# Patient Record
Sex: Male | Born: 1940 | Race: Black or African American | Hispanic: No | Marital: Married | Smoking: Former smoker
Health system: Southern US, Community
[De-identification: ages and names within clinical notes are randomized; demographics above are authoritative.]

## PROBLEM LIST (undated history)

## (undated) DIAGNOSIS — E785 Hyperlipidemia, unspecified: Secondary | ICD-10-CM

## (undated) DIAGNOSIS — J349 Unspecified disorder of nose and nasal sinuses: Secondary | ICD-10-CM

## (undated) DIAGNOSIS — N400 Enlarged prostate without lower urinary tract symptoms: Secondary | ICD-10-CM

## (undated) DIAGNOSIS — C9000 Multiple myeloma not having achieved remission: Secondary | ICD-10-CM

## (undated) DIAGNOSIS — I10 Essential (primary) hypertension: Secondary | ICD-10-CM

## (undated) DIAGNOSIS — M1990 Unspecified osteoarthritis, unspecified site: Secondary | ICD-10-CM

## (undated) DIAGNOSIS — I4891 Unspecified atrial fibrillation: Secondary | ICD-10-CM

## (undated) DIAGNOSIS — Z972 Presence of dental prosthetic device (complete) (partial): Secondary | ICD-10-CM

## (undated) DIAGNOSIS — G4730 Sleep apnea, unspecified: Secondary | ICD-10-CM

## (undated) DIAGNOSIS — T7840XA Allergy, unspecified, initial encounter: Secondary | ICD-10-CM

## (undated) HISTORY — DX: Allergy, unspecified, initial encounter: T78.40XA

## (undated) HISTORY — DX: Hyperlipidemia, unspecified: E78.5

## (undated) HISTORY — DX: Unspecified disorder of nose and nasal sinuses: J34.9

## (undated) HISTORY — DX: Sleep apnea, unspecified: G47.30

## (undated) HISTORY — PX: VASECTOMY: SHX75

## (undated) HISTORY — PX: HERNIA REPAIR: SHX51

---

## 2008-12-17 ENCOUNTER — Encounter: Admission: RE | Admit: 2008-12-17 | Discharge: 2008-12-17 | Payer: Self-pay | Admitting: Geriatric Medicine

## 2008-12-25 ENCOUNTER — Encounter: Admission: RE | Admit: 2008-12-25 | Discharge: 2008-12-25 | Payer: Self-pay | Admitting: Geriatric Medicine

## 2010-03-16 ENCOUNTER — Encounter: Admission: RE | Admit: 2010-03-16 | Discharge: 2010-03-16 | Payer: Self-pay | Admitting: Geriatric Medicine

## 2010-05-28 ENCOUNTER — Encounter: Admission: RE | Admit: 2010-05-28 | Discharge: 2010-05-28 | Payer: Self-pay | Admitting: Geriatric Medicine

## 2012-02-18 ENCOUNTER — Emergency Department (HOSPITAL_COMMUNITY)
Admission: EM | Admit: 2012-02-18 | Discharge: 2012-02-18 | Disposition: A | Discharge status: Home / Self Care | Payer: Medicare Other | Attending: Emergency Medicine | Admitting: Emergency Medicine

## 2012-02-18 ENCOUNTER — Encounter (HOSPITAL_COMMUNITY): Payer: Self-pay | Admitting: Emergency Medicine

## 2012-02-18 ENCOUNTER — Emergency Department (HOSPITAL_COMMUNITY): Payer: Medicare Other

## 2012-02-18 DIAGNOSIS — R509 Fever, unspecified: Secondary | ICD-10-CM | POA: Insufficient documentation

## 2012-02-18 DIAGNOSIS — I10 Essential (primary) hypertension: Secondary | ICD-10-CM | POA: Insufficient documentation

## 2012-02-18 DIAGNOSIS — N400 Enlarged prostate without lower urinary tract symptoms: Secondary | ICD-10-CM | POA: Insufficient documentation

## 2012-02-18 DIAGNOSIS — N390 Urinary tract infection, site not specified: Secondary | ICD-10-CM | POA: Insufficient documentation

## 2012-02-18 HISTORY — DX: Essential (primary) hypertension: I10

## 2012-02-18 HISTORY — DX: Benign prostatic hyperplasia without lower urinary tract symptoms: N40.0

## 2012-02-18 LAB — DIFFERENTIAL
Basophils Absolute: 0 10*3/uL (ref 0.0–0.1)
Basophils Relative: 0 % (ref 0–1)
Eosinophils Absolute: 0 10*3/uL (ref 0.0–0.7)
Eosinophils Relative: 0 % (ref 0–5)
Lymphocytes Relative: 7 % — ABNORMAL LOW (ref 12–46)
Lymphs Abs: 0.9 10*3/uL (ref 0.7–4.0)
Monocytes Absolute: 1.1 10*3/uL — ABNORMAL HIGH (ref 0.1–1.0)
Monocytes Relative: 9 % (ref 3–12)
Neutro Abs: 10.5 10*3/uL — ABNORMAL HIGH (ref 1.7–7.7)
Neutrophils Relative %: 84 % — ABNORMAL HIGH (ref 43–77)

## 2012-02-18 LAB — URINALYSIS, ROUTINE W REFLEX MICROSCOPIC
Bilirubin Urine: NEGATIVE
Glucose, UA: NEGATIVE mg/dL
Hgb urine dipstick: NEGATIVE
Ketones, ur: NEGATIVE mg/dL
Nitrite: POSITIVE — AB
Protein, ur: NEGATIVE mg/dL
Specific Gravity, Urine: 1.017 (ref 1.005–1.030)
Urobilinogen, UA: 1 mg/dL (ref 0.0–1.0)
pH: 7.5 (ref 5.0–8.0)

## 2012-02-18 LAB — BASIC METABOLIC PANEL
BUN: 20 mg/dL (ref 6–23)
CO2: 30 mEq/L (ref 19–32)
Calcium: 9.5 mg/dL (ref 8.4–10.5)
Chloride: 102 mEq/L (ref 96–112)
Creatinine, Ser: 1.46 mg/dL — ABNORMAL HIGH (ref 0.50–1.35)
GFR calc Af Amer: 54 mL/min — ABNORMAL LOW (ref >90)
GFR calc non Af Amer: 47 mL/min — ABNORMAL LOW (ref >90)
Glucose, Bld: 118 mg/dL — ABNORMAL HIGH (ref 70–99)
Potassium: 4.4 mEq/L (ref 3.5–5.1)
Sodium: 140 mEq/L (ref 135–145)

## 2012-02-18 LAB — CBC
HCT: 41.1 % (ref 39.0–52.0)
Hemoglobin: 13.7 g/dL (ref 13.0–17.0)
MCH: 28.8 pg (ref 26.0–34.0)
MCHC: 33.3 g/dL (ref 30.0–36.0)
MCV: 86.3 fL (ref 78.0–100.0)
Platelets: 215 10*3/uL (ref 150–400)
RBC: 4.76 MIL/uL (ref 4.22–5.81)
RDW: 13.6 % (ref 11.5–15.5)
WBC: 12.6 10*3/uL — ABNORMAL HIGH (ref 4.0–10.5)

## 2012-02-18 LAB — URINE MICROSCOPIC-ADD ON

## 2012-02-18 MED ORDER — CIPROFLOXACIN HCL 250 MG PO TABS: 250.0000 mg | ORAL_TABLET | Freq: Two times a day (BID) | ORAL | Status: AC

## 2012-02-18 MED ORDER — ACETAMINOPHEN 325 MG PO TABS
650.0000 mg | ORAL_TABLET | Freq: Once | ORAL | Status: AC
  Administered 2012-02-18: 650 mg via ORAL
  Filled 2012-02-18: qty 2

## 2012-02-18 MED ORDER — CIPROFLOXACIN HCL 500 MG PO TABS
500.0000 mg | ORAL_TABLET | Freq: Once | ORAL | Status: AC
  Administered 2012-02-18: 500 mg via ORAL
  Filled 2012-02-18: qty 1

## 2012-02-18 NOTE — ED Notes (Signed)
Pt states that when he went to bed around 2300, he felt "like something was coming over him".  He got up and took his temperature and states that it said 104.  C/o chills.  Denies any pain.  NAD.

## 2012-02-18 NOTE — Discharge Instructions (Signed)

## 2012-02-18 NOTE — ED Notes (Signed)
Pt presented to the Er with c/o fever and chills that started earlier today, pt states he did not take nothing at home for the fever and that when measure fever was 104.0 (oral). Denies any Shob, denies generalized weakness or aches.

## 2012-02-18 NOTE — ED Provider Notes (Addendum)
History     CSN: [id number]  Arrival date & time 02/18/12  0207   First MD Initiated Contact with Patient 02/18/12 0421      Chief Complaint  Patient presents with  . Fever  . Chills    (Consider location/radiation/quality/duration/timing/severity/associated sxs/prior treatment) HPI This is a 71-year-old black male who developed fever to 104 and chills about midnight this morning. He has not taken anything to treat his fever or chills. His only other symptom is urinary frequency. He denies pain with urination, cough, dyspnea, sore throat, nasal congestion, body aches, nausea, vomiting, diarrhea or abdominal pain.  Past Medical History  Diagnosis Date  . Hypertension   . Prostatic hypertrophy     Past Surgical History  Procedure Date  . Hernia repair   . Vasectomy     Family History  Problem Relation Age of Onset  . Hypertension Other   . Cancer Other     History  Substance Use Topics  . Smoking status: Former Games developer  . Smokeless tobacco: Not on file  . Alcohol Use: No      Review of Systems  All other systems reviewed and are negative.    Allergies  Review of patient's allergies indicates no known allergies.  Home Medications   Current Outpatient Rx  Name Route Sig Dispense Refill  . ASPIRIN 81 MG PO TABS Oral Take 81 mg by mouth daily.    . ATORVASTATIN CALCIUM 20 MG PO TABS Oral Take 20 mg by mouth daily.    Marland Kitchen FINASTERIDE 5 MG PO TABS Oral Take 5 mg by mouth daily.    . OMEGA-3 FATTY ACIDS 1000 MG PO CAPS Oral Take 2 g by mouth daily.    Marland Kitchen ONE-DAILY MULTI VITAMINS PO TABS Oral Take 1 tablet by mouth daily.    Marland Kitchen TERAZOSIN HCL 5 MG PO CAPS Oral Take 5 mg by mouth at bedtime.    Marland Kitchen VITAMIN E 400 UNITS PO CAPS Oral Take 1,200 Units by mouth daily.      BP 128/66  Pulse 101  Temp(Src) 102.9 F (39.4 C) (Oral)  Resp 18  SpO2 95%  Physical Exam General: Well-developed, well-nourished male in no acute distress; appearance consistent with age of  record HENT: normocephalic, atraumatic; no pharyngeal erythema or exudate Eyes: pupils equal round and reactive to light; extraocular muscles intact Neck: supple Heart: regular rate and rhythm Lungs: clear to auscultation bilaterally Abdomen: soft; nondistended; nontender; no masses or hepatosplenomegaly; bowel sounds present GU: Urine cloudy Extremities: No deformity; full range of motion; pulses normal; trace edema of lower leg Neurologic: Awake, alert and oriented; motor function intact in all extremities and symmetric; no facial droop Skin: Warm and dry Psychiatric: Normal mood and affect    ED Course  Procedures (including critical care time)    MDM   Nursing notes and vitals signs, including pulse oximetry, reviewed.  Summary of this visit's results, reviewed by myself:  Labs:  Results for orders placed during the hospital encounter of 02/18/12  URINALYSIS, ROUTINE W REFLEX MICROSCOPIC      Component Value Range   Color, Urine YELLOW  YELLOW    APPearance CLOUDY (*) CLEAR    Specific Gravity, Urine 1.017  1.005 - 1.030    pH 7.5  5.0 - 8.0    Glucose, UA NEGATIVE  NEGATIVE (mg/dL)   Hgb urine dipstick NEGATIVE  NEGATIVE    Bilirubin Urine NEGATIVE  NEGATIVE    Ketones, ur NEGATIVE  NEGATIVE (mg/dL)  Protein, ur NEGATIVE  NEGATIVE (mg/dL)   Urobilinogen, UA 1.0  0.0 - 1.0 (mg/dL)   Nitrite POSITIVE (*) NEGATIVE    Leukocytes, UA MODERATE (*) NEGATIVE   CBC      Component Value Range   WBC 12.6 (*) 4.0 - 10.5 (K/uL)   RBC 4.76  4.22 - 5.81 (MIL/uL)   Hemoglobin 13.7  13.0 - 17.0 (g/dL)   HCT 82.9  56.2 - 13.0 (%)   MCV 86.3  78.0 - 100.0 (fL)   MCH 28.8  26.0 - 34.0 (pg)   MCHC 33.3  30.0 - 36.0 (g/dL)   RDW 86.5  78.4 - 69.6 (%)   Platelets 215  150 - 400 (K/uL)  DIFFERENTIAL      Component Value Range   Neutrophils Relative 84 (*) 43 - 77 (%)   Neutro Abs 10.5 (*) 1.7 - 7.7 (K/uL)   Lymphocytes Relative 7 (*) 12 - 46 (%)   Lymphs Abs 0.9  0.7 - 4.0  (K/uL)   Monocytes Relative 9  3 - 12 (%)   Monocytes Absolute 1.1 (*) 0.1 - 1.0 (K/uL)   Eosinophils Relative 0  0 - 5 (%)   Eosinophils Absolute 0.0  0.0 - 0.7 (K/uL)   Basophils Relative 0  0 - 1 (%)   Basophils Absolute 0.0  0.0 - 0.1 (K/uL)  BASIC METABOLIC PANEL      Component Value Range   Sodium 140  135 - 145 (mEq/L)   Potassium 4.4  3.5 - 5.1 (mEq/L)   Chloride 102  96 - 112 (mEq/L)   CO2 30  19 - 32 (mEq/L)   Glucose, Bld 118 (*) 70 - 99 (mg/dL)   BUN 20  6 - 23 (mg/dL)   Creatinine, Ser 2.95 (*) 0.50 - 1.35 (mg/dL)   Calcium 9.5  8.4 - 28.4 (mg/dL)   GFR calc non Af Amer 47 (*) >90 (mL/min)   GFR calc Af Amer 54 (*) >90 (mL/min)  URINE MICROSCOPIC-ADD ON      Component Value Range   Squamous Epithelial / LPF RARE  RARE    WBC, UA TOO NUMEROUS TO COUNT  <3 (WBC/hpf)   Bacteria, UA MANY (*) RARE     Imaging Studies: Dg Chest 2 View  02/18/2012  *RADIOLOGY REPORT*  Clinical Data: Chills, low grade fever.  CHEST - 2 VIEW  Comparison: 05/28/2010 CT  Findings: Linear opacity at the lung bases, likely scarring or atelectasis.  Otherwise, no focal areas of consolidation.  No pleural effusion or pneumothorax.  The cardiomediastinal contours are within normal limits.  Mild multilevel degenerative changes. There may be mild smooth periosteal thickening along the right humeral shaft.  IMPRESSION: Mild lung base linear opacities, likely scarring or atelectasis. Otherwise, no focal areas of consolidation.  Original Report Authenticated By: Waneta Martins, M.D.            Hanley Seamen, MD 02/18/12 0534  Hanley Seamen, MD 02/18/12 1324

## 2012-02-18 NOTE — ED Notes (Signed)
Pt states he got home about 1030 pm and went to bed at 2300  Pt states about midnight he began to shiver took his temp and it was 104

## 2012-02-18 NOTE — ED Notes (Signed)
[id number]<AV> Expected date:<BR> Expected time:<BR> Means of arrival:<BR> Comments:<BR> Triage 2

## 2012-02-20 LAB — URINE CULTURE
Colony Count: >=100000
Culture  Setup Time: 201303091151

## 2012-02-21 NOTE — ED Notes (Signed)
+   urine Patient treated with cipro-sensitive to same-chart appended per protocol MD. 

## 2012-12-25 ENCOUNTER — Encounter: Payer: Self-pay | Admitting: Pulmonary Disease

## 2012-12-25 ENCOUNTER — Ambulatory Visit (INDEPENDENT_AMBULATORY_CARE_PROVIDER_SITE_OTHER): Payer: Medicare Other | Admitting: Pulmonary Disease

## 2012-12-25 VITALS — BP 140/78 | HR 69 | Temp 97.9°F | Ht 75.0 in | Wt 251.6 lb

## 2012-12-25 DIAGNOSIS — G4733 Obstructive sleep apnea (adult) (pediatric): Secondary | ICD-10-CM

## 2012-12-25 NOTE — Assessment & Plan Note (Signed)
Patient has a history of obstructive sleep apnea diagnosed in 2000, and has done very well with CPAP.  He is well overdue for a new machine, and I would like to take this opportunity to re- optimize his pressure.  I have encouraged him to keep up with his mask changes and supplies, and also asked him to work aggressively on weight loss.  If he is doing well, I will see him back in one year.

## 2012-12-25 NOTE — Progress Notes (Signed)
  Subjective:    Patient ID: Edward Gallagher, male    DOB: [birth year], 72 y.o.   MRN: [id number]  HPI The patient is a 72 year old male who comes in today as a self-referral to establish with a sleep physician.  He was diagnosed with obstructive sleep apnea in 2000, and started on CPAP while living in [place].  He has done very well with the device, and is currently using his initial CPAP device.  He also uses nasal pillows, and has kept up with mask changes and supplies.  The patient feels that he sleeps well when he wears CPAP, and feels rested in the mornings upon arising.  He does not have frequent awakenings at night.  He feels that he has excellent daytime alertness, even with quiet times.  He is very satisfied with his treatment outcome.  The patient states that his weight is up 20 pounds since his initial sleep study, and his epworth score is only one.   Sleep Questionnaire: What time do you typically go to bed?( Between what hours) 10:00p How long does it take you to fall asleep? within minutes How many times during the night do you wake up? 1 What time do you get out of bed to start your day? 0630 Do you drive or operate heavy machinery in your occupation? No How much has your weight changed (up or down) over the past two years? (In pounds) 5 lb (2.268 kg) Have you ever had a sleep study before? Yes If yes, location of study? Dr Das [place] If yes, date of study? 2000-2001 Do you currently use CPAP? Yes If so, what pressure? unsure of pressure---has notbeen adjusted since 2000-2001 Do you wear oxygen at any time? No    Review of Systems  Constitutional: Negative for fever and unexpected weight change.  HENT: Positive for congestion, rhinorrhea and postnasal drip. Negative for ear pain, nosebleeds, sore throat, sneezing, trouble swallowing, dental problem and sinus pressure.        Patient has allergies--takes allergy vaccines  Eyes: Negative for redness and itching.  Respiratory:  Negative for cough, chest tightness, shortness of breath and wheezing.   Cardiovascular: Negative for palpitations and leg swelling.  Gastrointestinal: Negative for nausea and vomiting.  Genitourinary: Negative for dysuria.       Sees urologist  Musculoskeletal: Negative for joint swelling.  Skin: Negative for rash.  Neurological: Negative for headaches.  Hematological: Does not bruise/bleed easily.  Psychiatric/Behavioral: Negative for dysphoric mood. The patient is not nervous/anxious.        Objective:   Physical Exam Constitutional:  Well developed, no acute distress  HENT:  Nares patent without discharge  Oropharynx without exudate, palate and uvula are elongated.   Eyes:  Perrla, eomi, no scleral icterus  Neck:  No JVD, no TMG  Cardiovascular:  Normal rate, regular rhythm, no rubs or gallops.  No murmurs        Intact distal pulses  Pulmonary :  Normal breath sounds, no stridor or respiratory distress   No rales, rhonchi, or wheezing  Abdominal:  Soft, nondistended, bowel sounds present.  No tenderness noted.   Musculoskeletal:  No lower extremity edema noted.  Lymph Nodes:  No cervical lymphadenopathy noted  Skin:  No cyanosis noted  Neurologic:  Alert, appropriate, moves all 4 extremities without obvious deficit.         Assessment & Plan:

## 2012-12-25 NOTE — Patient Instructions (Addendum)
Will send an order to [facility] to get you a new machine.  Will take this opportunity to re-optimize your pressure.  Will call you with the results once I receive your download.  Work on weight reduction Keep up with mask changes and supplies. followup with me in one year if doing well.

## 2013-02-23 ENCOUNTER — Other Ambulatory Visit: Payer: Self-pay | Admitting: Pulmonary Disease

## 2013-02-23 DIAGNOSIS — G4733 Obstructive sleep apnea (adult) (pediatric): Secondary | ICD-10-CM

## 2013-06-19 ENCOUNTER — Telehealth: Payer: Self-pay | Admitting: Pulmonary Disease

## 2013-06-19 NOTE — Telephone Encounter (Signed)
Spoke with pt and he states that his wife reports that he is still snoring while wearing cpap.  I asked the pt if he notices any leaks in his mask.  He states that he is going to check on this tonight and get back with [place] and let us know. - Will await call

## 2013-06-20 NOTE — Telephone Encounter (Signed)
Spoke with patient, patient states he did not notice any leaking over the night States changing the nasal pillows helped and nothing further is needed at this time

## 2013-09-16 ENCOUNTER — Telehealth: Payer: Self-pay | Admitting: Pulmonary Disease

## 2013-09-16 DIAGNOSIS — G4733 Obstructive sleep apnea (adult) (pediatric): Secondary | ICD-10-CM

## 2013-09-16 NOTE — Telephone Encounter (Signed)
Ok to decrease to 14, but make pt aware will need to watch and see how he feels on a subtherapeutic pressure.

## 2013-09-16 NOTE — Telephone Encounter (Signed)
I spoke with pt. He stated his CPAP is set at 16 CM. He believes it is set to high. The air blows the mask off his nose and breaks the seal then he will start snoring. He reports this has been going on x 1 month now. He wants the pressure decreased. Please advise KC thanks

## 2013-09-16 NOTE — Telephone Encounter (Signed)
Pt aware of recs and referral sent.

## 2013-11-24 LAB — BASIC METABOLIC PANEL
Anion Gap: 1 — ABNORMAL LOW (ref 7–16)
BUN: 20 mg/dL — ABNORMAL HIGH (ref 7–18)
Calcium, Total: 9.3 mg/dL (ref 8.5–10.1)
Chloride: 103 mmol/L (ref 98–107)
Co2: 34 mmol/L — ABNORMAL HIGH (ref 21–32)
Creatinine: 1.35 mg/dL — ABNORMAL HIGH (ref 0.60–1.30)
EGFR (African American): >60
EGFR (Non-African Amer.): 52 — ABNORMAL LOW
Glucose: 134 mg/dL — ABNORMAL HIGH (ref 65–99)
Osmolality: 280 (ref 275–301)
Potassium: 4.1 mmol/L (ref 3.5–5.1)
Sodium: 138 mmol/L (ref 136–145)

## 2013-11-24 LAB — CBC
HCT: 40.4 % (ref 40.0–52.0)
HGB: 12.5 g/dL — ABNORMAL LOW (ref 13.0–18.0)
MCH: 26.3 pg (ref 26.0–34.0)
MCHC: 31 g/dL — ABNORMAL LOW (ref 32.0–36.0)
MCV: 85 fL (ref 80–100)
Platelet: 211 10*3/uL (ref 150–440)
RBC: 4.76 10*6/uL (ref 4.40–5.90)
RDW: 14.2 % (ref 11.5–14.5)
WBC: 8.6 10*3/uL (ref 3.8–10.6)

## 2013-11-24 LAB — TROPONIN I: Troponin-I: <0.02 ng/mL

## 2013-11-25 ENCOUNTER — Inpatient Hospital Stay: Payer: Self-pay | Admitting: Internal Medicine

## 2013-11-25 LAB — URINALYSIS, COMPLETE
Bacteria: NONE SEEN
Bilirubin,UR: NEGATIVE
Glucose,UR: NEGATIVE mg/dL (ref 0–75)
Ketone: NEGATIVE
Leukocyte Esterase: NEGATIVE
Nitrite: NEGATIVE
Ph: 5 (ref 4.5–8.0)
Protein: 30
RBC,UR: 2117 /HPF (ref 0–5)
Specific Gravity: 1.054 (ref 1.003–1.030)
Squamous Epithelial: <1
WBC UR: 10 /HPF (ref 0–5)

## 2013-11-25 LAB — TROPONIN I: Troponin-I: <0.02 ng/mL

## 2013-11-26 LAB — BASIC METABOLIC PANEL
Anion Gap: 3 — ABNORMAL LOW (ref 7–16)
BUN: 20 mg/dL — ABNORMAL HIGH (ref 7–18)
Calcium, Total: 8.9 mg/dL (ref 8.5–10.1)
Chloride: 106 mmol/L (ref 98–107)
Co2: 31 mmol/L (ref 21–32)
Creatinine: 1.3 mg/dL (ref 0.60–1.30)
EGFR (African American): >60
EGFR (Non-African Amer.): 55 — ABNORMAL LOW
Glucose: 92 mg/dL (ref 65–99)
Osmolality: 282 (ref 275–301)
Potassium: 4.2 mmol/L (ref 3.5–5.1)
Sodium: 140 mmol/L (ref 136–145)

## 2013-11-26 LAB — CBC WITH DIFFERENTIAL/PLATELET
Basophil #: 0 10*3/uL (ref 0.0–0.1)
Basophil %: 0.5 %
Eosinophil #: 0.1 10*3/uL (ref 0.0–0.7)
Eosinophil %: 0.9 %
HCT: 35 % — ABNORMAL LOW (ref 40.0–52.0)
HGB: 11.9 g/dL — ABNORMAL LOW (ref 13.0–18.0)
Lymphocyte #: 1.1 10*3/uL (ref 1.0–3.6)
Lymphocyte %: 15.5 %
MCH: 28.8 pg (ref 26.0–34.0)
MCHC: 34.1 g/dL (ref 32.0–36.0)
MCV: 85 fL (ref 80–100)
Monocyte #: 0.9 x10 3/mm (ref 0.2–1.0)
Monocyte %: 13.2 %
Neutrophil #: 4.9 10*3/uL (ref 1.4–6.5)
Neutrophil %: 69.9 %
Platelet: 176 10*3/uL (ref 150–440)
RBC: 4.14 10*6/uL — ABNORMAL LOW (ref 4.40–5.90)
RDW: 14.4 % (ref 11.5–14.5)
WBC: 7 10*3/uL (ref 3.8–10.6)

## 2013-11-26 LAB — URINE CULTURE

## 2013-11-30 LAB — CULTURE, BLOOD (SINGLE)

## 2013-12-25 ENCOUNTER — Ambulatory Visit (INDEPENDENT_AMBULATORY_CARE_PROVIDER_SITE_OTHER): Payer: Medicare Other | Admitting: Pulmonary Disease

## 2013-12-25 ENCOUNTER — Encounter (INDEPENDENT_AMBULATORY_CARE_PROVIDER_SITE_OTHER): Payer: Self-pay

## 2013-12-25 ENCOUNTER — Encounter: Payer: Self-pay | Admitting: Pulmonary Disease

## 2013-12-25 VITALS — BP 128/82 | HR 83 | Temp 98.0°F | Ht 75.0 in | Wt 242.6 lb

## 2013-12-25 DIAGNOSIS — G4733 Obstructive sleep apnea (adult) (pediatric): Secondary | ICD-10-CM

## 2013-12-25 NOTE — Assessment & Plan Note (Signed)
The patient is doing very well with his current CPAP device, however recently had episodes of pneumonia which interfered with his use. He is now getting back on the device, and does not foresee any issues. He was sleeping well with excellent daytime alertness prior to getting his pulmonary infections. I've asked him to keep up with his supplies, and also to work aggressively on weight loss.

## 2013-12-25 NOTE — Patient Instructions (Signed)
Continue with cpap, and keep up with mask changes and supplies. Work on weight loss followup with me in one year if doing well.  

## 2013-12-25 NOTE — Progress Notes (Signed)
   Subjective:    Patient ID: Edward Gallagher, male    DOB: [birth year], 73 y.o.   MRN: [id number]  HPI The patient comes in today for followup of his obstructive sleep apnea. He is been wearing CPAP compliantly and doing well with his new device, however recently he has had issues with pulmonary infections which has limited his use. He is now getting back on his device. He is having no issues with mask fit or pressure, and felt that his daytime alertness was excellent prior to the last few months.   Review of Systems  Constitutional: Negative for fever and unexpected weight change.  HENT: Positive for congestion ( left sided chest congestion dx PNA 11/23/13 and again 12/23/13). Negative for dental problem, ear pain, nosebleeds, postnasal drip, rhinorrhea, sinus pressure, sneezing, sore throat and trouble swallowing.   Eyes: Negative for redness and itching.  Respiratory: Negative for cough, chest tightness, shortness of breath and wheezing.   Cardiovascular: Negative for palpitations and leg swelling.  Gastrointestinal: Negative for nausea and vomiting.  Genitourinary: Negative for dysuria.  Musculoskeletal: Negative for joint swelling.  Skin: Negative for rash.  Neurological: Negative for headaches.  Hematological: Does not bruise/bleed easily.  Psychiatric/Behavioral: Negative for dysphoric mood. The patient is not nervous/anxious.        Objective:   Physical Exam Overweight male in no acute distress Nose without purulence or discharge noted No skin breakdown or pressure necrosis from the CPAP mass Neck without lymphadenopathy or thyromegaly Lower extremities with mild edema, no cyanosis Alert and oriented, moves all 4 extremities.       Assessment & Plan:

## 2014-08-15 ENCOUNTER — Ambulatory Visit: Payer: Self-pay | Admitting: Podiatry

## 2014-08-19 ENCOUNTER — Encounter: Payer: Self-pay | Admitting: Podiatry

## 2014-08-19 ENCOUNTER — Ambulatory Visit (INDEPENDENT_AMBULATORY_CARE_PROVIDER_SITE_OTHER): Payer: Medicare Other | Admitting: Podiatry

## 2014-08-19 VITALS — BP 141/86 | HR 81 | Resp 16 | Ht 75.0 in | Wt 235.0 lb

## 2014-08-19 DIAGNOSIS — M2010 Hallux valgus (acquired), unspecified foot: Secondary | ICD-10-CM

## 2014-08-19 DIAGNOSIS — B351 Tinea unguium: Secondary | ICD-10-CM

## 2014-08-19 NOTE — Progress Notes (Signed)
   Subjective:    Patient ID: Edward Gallagher, male    DOB: 1941-11-07, 73 y.o.   MRN: [id number]  HPI Comments: My left big toenail started to turn black back in 2011. Its getting better. It has no pain to it. i went to [facility] to get lamisil and its helped. i trim my toenails.     Review of Systems  Allergic/Immunologic: Positive for environmental allergies.  All other systems reviewed and are negative.      Objective:   Physical Exam        Assessment & Plan:

## 2014-08-19 NOTE — Progress Notes (Signed)
Subjective:     Patient ID: Edward Gallagher, male   DOB: [birth year], 73 y.o.   MRN: [id number]  HPI patient presents with damaged left big toenail that turned black in 2011 without remembering any history of trauma   Review of Systems  All other systems reviewed and are negative.      Objective:   Physical Exam  Nursing note and vitals reviewed. Constitutional: He is oriented to person, place, and time.  Cardiovascular: Intact distal pulses.   Musculoskeletal: Normal range of motion.  Neurological: He is oriented to person, place, and time.  Skin: Skin is warm and dry.   neurovascular status intact with muscle strength adequate in range of motion within normal limits. Patient's found to have good digital perfusion and moderate diminishment the arch height and hyperostosis medial aspect first metatarsal head left. 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 At this point he wants an aggressive conservative approach and I recommended a combination of topical formulas 3 and laser therapy for which he is scheduled today. Patient understands is no guarantee that this will correct this problem

## 2014-09-01 ENCOUNTER — Ambulatory Visit: Payer: Medicare Other | Admitting: Podiatry

## 2014-09-01 DIAGNOSIS — B351 Tinea unguium: Secondary | ICD-10-CM

## 2014-09-01 NOTE — Patient Instructions (Signed)

## 2014-09-02 NOTE — Progress Notes (Signed)
Subjective:     Patient ID: Edward Gallagher, male   DOB: 04-20-1941, 73 y.o.   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

## 2014-09-30 ENCOUNTER — Other Ambulatory Visit: Payer: Self-pay | Admitting: Internal Medicine

## 2014-09-30 DIAGNOSIS — I714 Abdominal aortic aneurysm, without rupture, unspecified: Secondary | ICD-10-CM

## 2014-10-01 ENCOUNTER — Ambulatory Visit
Admission: RE | Admit: 2014-10-01 | Discharge: 2014-10-01 | Disposition: A | Discharge status: Home / Self Care | Payer: Medicare Other | Source: Ambulatory Visit | Attending: Internal Medicine | Admitting: Internal Medicine

## 2014-10-01 DIAGNOSIS — I714 Abdominal aortic aneurysm, without rupture, unspecified: Secondary | ICD-10-CM

## 2014-10-13 ENCOUNTER — Ambulatory Visit: Payer: Medicare Other | Admitting: Podiatry

## 2014-10-16 ENCOUNTER — Encounter: Payer: Self-pay | Admitting: Podiatry

## 2014-10-16 ENCOUNTER — Ambulatory Visit (INDEPENDENT_AMBULATORY_CARE_PROVIDER_SITE_OTHER): Payer: Medicare Other | Admitting: Podiatry

## 2014-10-16 DIAGNOSIS — B351 Tinea unguium: Secondary | ICD-10-CM

## 2014-10-16 NOTE — Progress Notes (Signed)
Subjective:     Patient ID: Edward Gallagher, male   DOB: [birth year], 72 y.o.   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

## 2014-12-25 ENCOUNTER — Ambulatory Visit: Payer: Medicare Other | Admitting: Pulmonary Disease

## 2015-01-05 ENCOUNTER — Encounter: Payer: Self-pay | Admitting: Pulmonary Disease

## 2015-01-05 ENCOUNTER — Ambulatory Visit (INDEPENDENT_AMBULATORY_CARE_PROVIDER_SITE_OTHER): Payer: Medicare Other | Admitting: Pulmonary Disease

## 2015-01-05 VITALS — BP 136/74 | HR 74 | Temp 97.1°F | Ht 75.0 in | Wt 249.6 lb

## 2015-01-05 DIAGNOSIS — G4733 Obstructive sleep apnea (adult) (pediatric): Secondary | ICD-10-CM

## 2015-01-05 NOTE — Patient Instructions (Signed)
Stay on cpap, and keep up with mask changes and supplies. I am happy to download your chip if you will bring by office and to followup visits. Work on weight loss followup with me again in one year if doing well.

## 2015-01-05 NOTE — Progress Notes (Signed)
   Subjective:    Patient ID: Edward Gallagher, male    DOB: 1941/08/12, 74 y.o.   MRN: [id number]  HPI The patient comes in today for follow-up of his known obstructive sleep apnea. He is wearing C Pap compliantly, and is having no issues with his mask fit or pressure. He is satisfied with his sleep, and feels rested in the mornings upon arising. He denies any significant sleepiness with inactivity. Of note, the patient's weight is up 7 pounds from the last visit.   Review of Systems  Constitutional: Negative for fever and unexpected weight change.  HENT: Negative for congestion, dental problem, ear pain, nosebleeds, postnasal drip, rhinorrhea, sinus pressure, sneezing, sore throat and trouble swallowing.   Eyes: Negative for redness and itching.  Respiratory: Negative for cough, chest tightness, shortness of breath and wheezing.   Cardiovascular: Negative for palpitations and leg swelling.  Gastrointestinal: Negative for nausea and vomiting.  Genitourinary: Negative for dysuria.  Musculoskeletal: Negative for joint swelling.  Skin: Negative for rash.  Neurological: Negative for headaches.  Hematological: Does not bruise/bleed easily.  Psychiatric/Behavioral: Negative for dysphoric mood. The patient is not nervous/anxious.        Objective:   Physical Exam Overweight male in no acute distress Nose without purulence or discharge noted Neck without lymphadenopathy or thyromegaly No skin breakdown or pressure necrosis from the C Pap mask Lower extremities without edema, no cyanosis Alert and oriented, does not appear to be sleepy, moves all 4 extremities.       Assessment & Plan:

## 2015-01-05 NOTE — Assessment & Plan Note (Signed)
Patient continues to do very well from a sleep apnea standpoint on his current C Pap set up. He feels that he is sleeping well with the device, and is having no issues with his mask fit or pressure. I have asked him to work more aggressively on weight loss, and to keep up with his mask changes and supplies. I will see him back in one year if doing well.

## 2015-01-26 ENCOUNTER — Telehealth: Payer: Self-pay | Admitting: Pulmonary Disease

## 2015-01-27 NOTE — Telephone Encounter (Signed)
Download we have was for 1 day only. Need for 90 days if possible.  Called pt and made aware. He will drop it by sometime this week for download

## 2015-02-12 ENCOUNTER — Ambulatory Visit (INDEPENDENT_AMBULATORY_CARE_PROVIDER_SITE_OTHER): Payer: Self-pay | Admitting: Podiatry

## 2015-02-12 ENCOUNTER — Encounter: Payer: Self-pay | Admitting: Podiatry

## 2015-02-12 VITALS — BP 127/66 | HR 68 | Resp 12

## 2015-02-12 DIAGNOSIS — B351 Tinea unguium: Secondary | ICD-10-CM

## 2015-02-12 NOTE — Progress Notes (Signed)
Subjective:     Patient ID: Edward Gallagher, male   DOB: [birth year], 74 y.o.   MRN: [id number]  HPI patient presents stating I still have some discoloration on my left big toe even though the yellowness seems to have somewhat resolved   Review of Systems     Objective:   Physical Exam neuro neurovascular status intact with mild yellowness distal aspect left big toe but more darkness on the proximal portion secondary to bruising and trauma     Assessment:     Doing okay with laser therapy with a small amount of fungus left     Plan:     Final laser administered 1000 pulses tolerated well reappoint as needed

## 2015-04-03 NOTE — H&P (Signed)
PATIENT NAME:  Edward Gallagher, Edward Gallagher MR#:  [id number] DATE OF BIRTH:  Dec 25, 1940  DATE OF ADMISSION:  12/15/201  CHIEF COMPLAINT: Chest pain and shortness of breath.   HISTORY OF PRESENT ILLNESS: The patient is a 74 year old male with past medical history of hypertension, hyperlipidemia, benign prostatic hypertrophy, who is presenting to the ER with a chief complaint of chest pain associated with shortness of breath. The patient it was of sudden onset and started yesterday. The patient felt like something sitting on his chest. The patient denies any cough. He comes into the ER. As the patient was slightly hypoxic on room air and a chief complaint of  chest tightness with shortness of breath, a CT angiogram of the chest was done which ruled out pulmonary embolus. It showed pneumonia. The patient is started on Rocephin and Zithromax. Initially his chest pain was 5 out of 10, but eventually trended down to a 2 to 3 out of 10. The patient's urinalysis has revealed 3+ blood and according to the nurses report the patient has frank blood looking urine. His troponin x 2 is negative. The patient is resting comfortably during my examination. He is reporting that taking deep inspirations is causing pain in the left side of the chest otherwise he is fine. No history of heart attacks in the past.  PAST MEDICAL HISTORY: Hypertension, hyperlipidemia and benign prostatic hypertrophy.   PAST SURGICAL HISTORY: Vasectomy and hernia repair.  ALLERGIES: No known drug allergies.   PSYCHOSOCIAL HISTORY: Lives at home with wife. Denies smoking, alcohol or illicit drug usage.   FAMILY HISTORY: Dad, brother and cousin has a history of prostate cancer.   HOME MEDICATIONS:  Hydrochlorothiazide 12.5 mg once daily, finasteride 5 mg once daily and atorvastatin 20 mg once a day.   VITAL SIGNS: Temperature 98.9, pulse 90, respirations 22, blood pressure 112/60, pulse oximetry 97%   REVIEW OF SYSTEMS: CONSTITUTIONAL: Denies any fever,  fatigue. EYES: Denies blurry vision or double vision. ENT: Denies epistaxis or discharge. RESPIRATION: Complaining of dyspnea, but denies any cough. CARDIOVASCULAR: Complaining of left-sided chest pain, no palpitations or syncope. GASTROINTESTINAL: Denies nausea, vomiting, diarrhea, abdominal pain. GENITOURINARY: No dysuria, hematuria. ENDOCRINE: Denies polyuria, nocturia, thyroid problems. HEMATOLOGIC AND LYMPHATIC: No anemia, easy bruising or bleeding.  INTEGUMENTARY: No acne, rash, lesions. MUSCULOSKELETAL: No joint pain in the neck and back. NEUROLOGIC: Denies vertigo or ataxia. PSYCHIATRIC: No ADD or OCD.  PHYSICAL EXAMINATION:   GENERAL: A gentleman in no acute distress. Moderately built and obese.  HEENT: Normocephalic, atraumatic. Pupils are equally reacting to light and accommodation. No scleral icterus. No conjunctival injection. No sinus tenderness. No postnasal drip. Moist mucous membranes.  NECK: Supple. No JVD. No thyromegaly.  LUNGS: Clear to auscultation bilaterally. No accessory muscle use and no anterior chest wall tenderness on palpation.  CARDIAC: S1, S2 normal. Regular rate and rhythm. No murmurs.  GASTROINTESTINAL: Soft. Bowel sounds are positive in all 4 quadrants. Nontender, nondistended. No hepatosplenomegaly. No masses felt. PSYCHIATRIC: Alert and oriented x 3. Motor and sensory grossly intact. Cranial nerves II through XII are intact. Reflexes are 2+.  EXTREMITIES: No edema. No cyanosis. No clubbing.  SKIN: Warm to touch. Normal turgor. No rashes. No lesions.  MUSCULOSKELETAL: No joint effusion, tenderness, erythema.   LABORATORY AND IMAGING STUDIES: Troponin less than 0.2 x 2. WBC 8.6, hemoglobin 12.5, hematocrit 40.4, platelets are 211. Urinalysis yellow in color, hazy in appearance, blood positive +3,  negative leukocyte esterase. BMP: BUN 20, creatinine 1.35, sodium 138, potassium 4.1, chloride  103, CO2 24. GFR greater than 50. Anion gap 1. Calcium and serum osmolality  are normal. Glucose 134. Chest x-ray, PA and lateral, low lung volumes with bibasilar atelectasis. CT angiogram of the chest: No pulmonary embolism, left greater than right basilar atelectasis and possible superimposed pneumonia. Mild bronchial wall thickening, may reflect bronchitis, mild central emphysema. A 12-lead EKG has revealed sinus rhythm at 88 beats per minute, PR interval is prolonged at 200 ms, normal QT and no ST-T wave changes.   ASSESSMENT AND PLAN: A 74 year old, African American male presenting to the ER with a chief complaint of chest pain associated with shortness of breath. He will be admitted with following assessment and plan:  1.  Chest pain with shortness of breath, probably from pneumonia, which may be causing pleuritic chest pain. CT chest has ruled out pulmonary embolism. Blood cultures were ordered. The patient will be on IV Rocephin and Zithromax. We will cycle cardiac biomarkers.  2.  Hematuria. Renal ultrasound is ordered. IV fluids. Urology consult is placed. We will check CBC in a.m.  3.  Benign prostatic hypertrophy. Resume home medication.  4.  Hypertension. Blood pressure is stable.  5.  Hyperlipidemia. Continue statin.  6.  Gastrointestinal prophylaxis will be provided. Deep vein thrombosis prophylaxis with sequential compression devices.   CODE STATUS: Full code. Wife is the medical power of attorney.   Diagnosis and plan of care was discussed in detail with the patient and he agreed with the plan.  TOTAL TIME SPENT ON ADMISSION: 45 minutes.  ___________________________ Nicholes Mango, MD ag:aw D: 11/25/2013 06:02:31 ET T: 11/25/2013 06:17:24 ET JOB#: [id number]  cc: Nicholes Mango, MD, <Dictator> Nicholes Mango MD ELECTRONICALLY SIGNED 12/06/2013 7:21

## 2015-04-03 NOTE — Consult Note (Signed)
PATIENT NAME:  Edward Gallagher, Edward Gallagher MR#:  [id number] DATE OF BIRTH:  11-09-1941  DATE OF CONSULTATION:  11/25/2013  REFERRING PHYSICIAN: Dr. Margaretmary Eddy. CONSULTING PHYSICIAN:  Scott C. Bernardo Heater, MD  REASON FOR CONSULTATION: Hematuria.   IMPRESSION:  1.  Microhematuria: Renal ultrasound is suboptimal for evaluation of hematuria and the standard evaluation would consist of the CT urogram and cystoscopy. The patient states he is followed by Dr. Jeffie Pollock for benign prostatic hypertrophy and hematuria. He states he has an appointment with Dr. Jeffie Pollock next month. I did not have the extent of his hematuria workup by Dr. Jeffie Pollock and will defer further evaluation to him.  2.  Benign prostatic hypertrophy with lower urinary tract symptoms.  3.  Bilateral renal cysts.  4.  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   RECOMMENDATION: There is no indication for urgent evaluation of his microhematuria. He will keep his followup appointment Jeffie Pollock and should he experience increasing hematuria, he may contact Dr. Jeffie Pollock or return to the Emergency Department for further evaluation.   HISTORY OF PRESENT ILLNESS: A 74 year old male presented to the Emergency Department on 12/14 complaining of chest pain and shortness of breath. He was found to have pneumonia. He was incidentally noted to have microhematuria and urology consultation was requested. He denies gross hematuria. He has mild lower urinary tract symptoms and is currently on finasteride. He states he is followed by Dr. Jeffie Pollock at [facility] urology specialist in [place] for [place] and has also been previously evaluated for hematuria. He states he has an appointment Dr. Jeffie Pollock next month. A renal ultrasound was performed of this morning. He denies fever, chills, dysuria or gross hematuria.   PAST MEDICAL HISTORY:  1.  Hypertension.  2.  Hyperlipidemia.  3.  Benign prostatic  hypertrophy.   PAST SURGICAL HISTORY:  1.  Herniorrhaphy.  2.  Vasectomy.   MEDICATIONS ON ADMISSION:  Finasteride 5 mg daily, atorvastatin 20 mg daily and hydrochlorothiazide 12.5 mg daily.   SOCIAL HISTORY: Denies tobacco or alcohol use.   FAMILY HISTORY: Positive for prostate cancer.  REVIEW OF SYSTEMS: Otherwise noncontributory except as per the HPI.   PHYSICAL EXAMINATION: Deferred.   DATA: UA: Yellow, hazy, 3+ blood, 2117 RBCs and 10 WBCs. Renal ultrasound was reviewed. There are bilateral renal cysts. There is an intravesical bladder mass, which is consistent with a large median lobe.   ____________________________ Ronda Fairly. Bernardo Heater, MD scs:aw D: 11/25/2013 12:37:24 ET T: 11/25/2013 12:52:05 ET JOB#: [id number]  cc: Nicki Reaper C. Bernardo Heater, MD, <Dictator> Abbie Sons MD ELECTRONICALLY SIGNED 11/27/2013 7:34

## 2015-04-03 NOTE — Consult Note (Signed)
Brief Consult Note: Diagnosis: Microhematuria.   Patient was seen by consultant.   Consult note dictated.   Comments: Pt followed by Dr. Jeffie Pollock at [facility] in [place] and has an appt next month. No indication for further inpatient evaluation.  Electronic Signatures: Abbie Sons (MD)  (Signed 15-Dec-14 12:39)  Authored: Brief Consult Note   Last Updated: 15-Dec-14 12:39 by Abbie Sons (MD)

## 2015-04-03 NOTE — Discharge Summary (Signed)
PATIENT NAME:  Edward Gallagher, Edward Gallagher MR#:  [id number] DATE OF BIRTH:  Aug 06, 1941  DATE OF ADMISSION:  11/25/2013 DATE OF DISCHARGE:  11/26/2013  DISCHARGE DIAGNOSES:   1.  Pneumonia, improving with antibiotics.  2.  Microhematuria requiring outpatient follow-up with urology in [place] as scheduled.   SECONDARY DIAGNOSES: 1.  Hypertension.  2.  Hyperlipidemia.  3.  Benign prostatic hypertrophy.   CONSULTANTS:  Urology, Dr. Bernardo Heater.   PROCEDURES AND RADIOLOGY STUDIES:  1.  Chest x-ray on 14th of December showed no acute cardiopulmonary disease.  2.  CT chest for PE protocol on 15th of December showed no pulmonary embolism.  Left greater than right pneumonia, possible underlying bronchitis.  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  3.  Bilateral kidney ultrasound on 15th of December showed 5.2 cm mass at the base of bladder, could be representing BPH, but the lesion is considered neoplastic until proven otherwise.  Multiple bilateral renal cysts.   MAJOR LABORATORY PANEL:  UA on admission showed no bacteria, 10 WBC, 2117 RBCs.   Urine culture was contaminated.   Blood cultures x 2 were negative.   HISTORY AND SHORT HOSPITAL COURSE:  The patient is a 74 year old male with above-mentioned medical problems who was admitted for chest pain with shortness of breath thought to be secondary from pneumonia.  Please see Dr. Rinaldo Ratel dictated history and physical for further details.  The patient was also found to have microhematuria for which urology consultation was obtained with Dr. Bernardo Heater who recommended outpatient follow-up with Dr. Jeffie Pollock at [facility] in [place] where the patient already had scheduled appointment.  No acute surgical intervention was recommended.  The patient was feeling much better on 16th of December and was discharged home in stable condition.   PHYSICAL EXAMINATION: VITAL SIGNS:  On the date of discharge, his vital signs were as follows:  Temperature 98.9, heart rate  89 per minute, respirations 18 per minute, blood pressure 118/71 mmHg.  He was saturating 94% on room air.  Pertinent physical examination on the date of discharge:  CARDIOVASCULAR:  S1, S2 normal.  No murmurs, rubs, or gallop.  LUNGS:  Clear to auscultation bilaterally.  No wheezing, rales, rhonchi or crepitation.  ABDOMEN:  Soft, benign.  NEUROLOGIC:  Nonfocal examination.  All other physical examination remained at baseline.   DISCHARGE MEDICATIONS: 1.  Terazosin 5 mg by mouth at bedtime.  2.  HCTZ 12.5 mg by mouth daily.  3.  Atorvastatin 20 mg by mouth at bedtime.  4.  Finasteride 5 mg by mouth at bedtime.  5.  Augmentin 875/125 mg 1 tablet by mouth twice daily for eight more days.   DISCHARGE DIET:  Low sodium.   DISCHARGE ACTIVITY:  As tolerated.   DISCHARGE INSTRUCTIONS AND FOLLOW-UP:  The patient was instructed to follow up with his primary care physician, Dr. Roe Coombs in 1 to 2 weeks.  He will need follow-up with Dr. Jeffie Pollock from [facility] in [place] as scheduled in 4 to 6 weeks.   Total time discharging this patient was 55 minutes.    ____________________________ Lucina Mellow. Manuella Ghazi, MD vss:ea D: 12/01/2013 00:31:26 ET T: 12/01/2013 04:06:33 ET JOB#: [id number]  cc: Umeka Wrench S. Manuella Ghazi, MD, <Dictator> Dr. Roe Coombs Scott C. Bernardo Heater, MD Dr. Jeffie Pollock at [facility] in [place] MD ELECTRONICALLY SIGNED 12/02/2013 7:48

## 2015-09-03 ENCOUNTER — Ambulatory Visit: Payer: Self-pay | Admitting: Podiatry

## 2015-09-04 ENCOUNTER — Ambulatory Visit (INDEPENDENT_AMBULATORY_CARE_PROVIDER_SITE_OTHER): Payer: Medicare Other | Admitting: Podiatry

## 2015-09-04 ENCOUNTER — Encounter: Payer: Self-pay | Admitting: Podiatry

## 2015-09-04 VITALS — BP 103/77 | HR 69 | Resp 16

## 2015-09-04 DIAGNOSIS — B353 Tinea pedis: Secondary | ICD-10-CM | POA: Diagnosis not present

## 2015-09-04 DIAGNOSIS — B351 Tinea unguium: Secondary | ICD-10-CM | POA: Diagnosis not present

## 2015-09-04 MED ORDER — TERBINAFINE HCL 250 MG PO TABS: 250.0000 mg | ORAL_TABLET | Freq: Every day | ORAL | Status: DC

## 2015-09-04 MED ORDER — NAFTIFINE HCL 1 % EX CREA: TOPICAL_CREAM | Freq: Every day | CUTANEOUS | Status: DC

## 2015-09-06 NOTE — Progress Notes (Signed)
Subjective:     Patient ID: Edward Gallagher, male   DOB: [birth year], 74 y.o.   MRN: [id number]  HPI patient has irritated skin plantar left and has a continued discolored left big toenail which has improved some with laser treatment   Review of Systems     Objective:   Physical Exam Neurovascular status was found to be intact with muscle strength and range of motion within normal limits for his age. He does have redness and irritation of the plantar left foot with some cracking scan that's localized in nature with no drainage odor or other pathology. 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 Reappoint to recheck

## 2015-11-02 DIAGNOSIS — I129 Hypertensive chronic kidney disease with stage 1 through stage 4 chronic kidney disease, or unspecified chronic kidney disease: Secondary | ICD-10-CM | POA: Diagnosis not present

## 2015-11-02 DIAGNOSIS — E785 Hyperlipidemia, unspecified: Secondary | ICD-10-CM | POA: Diagnosis not present

## 2015-11-02 DIAGNOSIS — N182 Chronic kidney disease, stage 2 (mild): Secondary | ICD-10-CM | POA: Diagnosis not present

## 2015-11-02 DIAGNOSIS — R7309 Other abnormal glucose: Secondary | ICD-10-CM | POA: Diagnosis not present

## 2015-12-14 DIAGNOSIS — G4733 Obstructive sleep apnea (adult) (pediatric): Secondary | ICD-10-CM | POA: Diagnosis not present

## 2015-12-23 DIAGNOSIS — N401 Enlarged prostate with lower urinary tract symptoms: Secondary | ICD-10-CM | POA: Diagnosis not present

## 2015-12-30 DIAGNOSIS — N401 Enlarged prostate with lower urinary tract symptoms: Secondary | ICD-10-CM | POA: Diagnosis not present

## 2015-12-30 DIAGNOSIS — N138 Other obstructive and reflux uropathy: Secondary | ICD-10-CM | POA: Diagnosis not present

## 2015-12-30 DIAGNOSIS — Z0000 Encounter for general adult medical examination without abnormal findings: Secondary | ICD-10-CM | POA: Diagnosis not present

## 2015-12-30 DIAGNOSIS — R351 Nocturia: Secondary | ICD-10-CM | POA: Diagnosis not present

## 2015-12-30 DIAGNOSIS — R972 Elevated prostate specific antigen [PSA]: Secondary | ICD-10-CM | POA: Diagnosis not present

## 2016-01-06 ENCOUNTER — Telehealth: Payer: Self-pay | Admitting: *Deleted

## 2016-01-06 ENCOUNTER — Ambulatory Visit: Payer: Medicare Other | Admitting: Pulmonary Disease

## 2016-01-06 DIAGNOSIS — B353 Tinea pedis: Secondary | ICD-10-CM

## 2016-01-06 MED ORDER — NAFTIFINE HCL 1 % EX CREA: TOPICAL_CREAM | Freq: Every day | CUTANEOUS | Status: DC

## 2016-01-06 NOTE — Telephone Encounter (Signed)
Pt request refill Naftin 1%, it is working well for him.  Dr. Paulla Dolly ordered as previously prn 1 year.  Done.

## 2016-01-12 ENCOUNTER — Encounter: Payer: Self-pay | Admitting: Pulmonary Disease

## 2016-01-12 ENCOUNTER — Ambulatory Visit (INDEPENDENT_AMBULATORY_CARE_PROVIDER_SITE_OTHER): Payer: Medicare Other | Admitting: Pulmonary Disease

## 2016-01-12 VITALS — BP 132/80 | HR 64 | Ht 75.0 in | Wt 254.0 lb

## 2016-01-12 DIAGNOSIS — G4733 Obstructive sleep apnea (adult) (pediatric): Secondary | ICD-10-CM

## 2016-01-12 NOTE — Progress Notes (Signed)
   Subjective:    Patient ID: Edward Gallagher, male    DOB: [birth year], 75 y.o.   MRN: [id number]  HPI  Chief Complaint  Patient presents with  . Follow-up    Doing well on CPAP. No complaints.    He was upset that I was 15 mins late seeing him. Mask ok , pr ok, no dryness Wt up 4 lbs Download 12/2015 - few missed nights- travel, no residuals on 14 cm, no leak Uses wisk mask  NPSG in [place] in 2000 Auto 01/2013:  Optimal pressure 16cm    In 09 58 -- out 10-08 Review of Systems  neg for any significant sore throat, dysphagia, itching, sneezing, nasal congestion or excess/ purulent secretions, fever, chills, sweats, unintended wt loss, pleuritic or exertional cp, hempoptysis, orthopnea pnd or change in chronic leg swelling.      Objective:   Physical Exam Gen. Pleasant, obese, in no distress ENT - no lesions, no post nasal drip Neck: No JVD, no thyromegaly, no carotid bruits Lungs: no use of accessory muscles, no dullness to percussion, decreased without rales or rhonchi  Cardiovascular: Rhythm regular, heart sounds  normal, no murmurs or gallops, no peripheral edema         Assessment & Plan:

## 2016-01-12 NOTE — Assessment & Plan Note (Signed)
Your CPAP is set at 14 cm Supplies will be renewed x 1 year  Weight loss encouraged, compliance with goal of at least 6 hrs every night is the expectation. He prefers FU in [place] - that is ok with me

## 2016-01-12 NOTE — Patient Instructions (Signed)
Your CPAP is set at 14 cm Supplies will be renewed x 1 year

## 2016-01-20 ENCOUNTER — Encounter: Payer: Self-pay | Admitting: Pulmonary Disease

## 2016-02-24 DIAGNOSIS — N182 Chronic kidney disease, stage 2 (mild): Secondary | ICD-10-CM | POA: Diagnosis not present

## 2016-02-24 DIAGNOSIS — R0982 Postnasal drip: Secondary | ICD-10-CM | POA: Diagnosis not present

## 2016-02-24 DIAGNOSIS — I129 Hypertensive chronic kidney disease with stage 1 through stage 4 chronic kidney disease, or unspecified chronic kidney disease: Secondary | ICD-10-CM | POA: Diagnosis not present

## 2016-02-29 DIAGNOSIS — B9689 Other specified bacterial agents as the cause of diseases classified elsewhere: Secondary | ICD-10-CM | POA: Diagnosis not present

## 2016-02-29 DIAGNOSIS — J029 Acute pharyngitis, unspecified: Secondary | ICD-10-CM | POA: Diagnosis not present

## 2016-02-29 DIAGNOSIS — J019 Acute sinusitis, unspecified: Secondary | ICD-10-CM | POA: Diagnosis not present

## 2016-02-29 DIAGNOSIS — R05 Cough: Secondary | ICD-10-CM | POA: Diagnosis not present

## 2016-03-23 DIAGNOSIS — H43811 Vitreous degeneration, right eye: Secondary | ICD-10-CM | POA: Diagnosis not present

## 2016-04-22 DIAGNOSIS — H43811 Vitreous degeneration, right eye: Secondary | ICD-10-CM | POA: Diagnosis not present

## 2016-05-04 DIAGNOSIS — Z0000 Encounter for general adult medical examination without abnormal findings: Secondary | ICD-10-CM | POA: Diagnosis not present

## 2016-05-04 DIAGNOSIS — N182 Chronic kidney disease, stage 2 (mild): Secondary | ICD-10-CM | POA: Diagnosis not present

## 2016-05-04 DIAGNOSIS — E559 Vitamin D deficiency, unspecified: Secondary | ICD-10-CM | POA: Diagnosis not present

## 2016-05-04 DIAGNOSIS — R7309 Other abnormal glucose: Secondary | ICD-10-CM | POA: Diagnosis not present

## 2016-05-04 DIAGNOSIS — I129 Hypertensive chronic kidney disease with stage 1 through stage 4 chronic kidney disease, or unspecified chronic kidney disease: Secondary | ICD-10-CM | POA: Diagnosis not present

## 2016-06-03 DIAGNOSIS — H43811 Vitreous degeneration, right eye: Secondary | ICD-10-CM | POA: Diagnosis not present

## 2016-06-20 DIAGNOSIS — L237 Allergic contact dermatitis due to plants, except food: Secondary | ICD-10-CM | POA: Diagnosis not present

## 2016-08-09 DIAGNOSIS — G4733 Obstructive sleep apnea (adult) (pediatric): Secondary | ICD-10-CM | POA: Diagnosis not present

## 2016-10-09 DIAGNOSIS — J019 Acute sinusitis, unspecified: Secondary | ICD-10-CM | POA: Diagnosis not present

## 2016-10-27 DIAGNOSIS — I129 Hypertensive chronic kidney disease with stage 1 through stage 4 chronic kidney disease, or unspecified chronic kidney disease: Secondary | ICD-10-CM | POA: Diagnosis not present

## 2016-10-27 DIAGNOSIS — E785 Hyperlipidemia, unspecified: Secondary | ICD-10-CM | POA: Diagnosis not present

## 2016-10-27 DIAGNOSIS — N182 Chronic kidney disease, stage 2 (mild): Secondary | ICD-10-CM | POA: Diagnosis not present

## 2016-10-27 DIAGNOSIS — R7303 Prediabetes: Secondary | ICD-10-CM | POA: Diagnosis not present

## 2016-10-27 DIAGNOSIS — Z1389 Encounter for screening for other disorder: Secondary | ICD-10-CM | POA: Diagnosis not present

## 2016-12-27 DIAGNOSIS — N401 Enlarged prostate with lower urinary tract symptoms: Secondary | ICD-10-CM | POA: Diagnosis not present

## 2017-01-02 DIAGNOSIS — R972 Elevated prostate specific antigen [PSA]: Secondary | ICD-10-CM | POA: Diagnosis not present

## 2017-01-02 DIAGNOSIS — N401 Enlarged prostate with lower urinary tract symptoms: Secondary | ICD-10-CM | POA: Diagnosis not present

## 2017-01-02 DIAGNOSIS — R310 Gross hematuria: Secondary | ICD-10-CM | POA: Diagnosis not present

## 2017-03-10 DIAGNOSIS — G4733 Obstructive sleep apnea (adult) (pediatric): Secondary | ICD-10-CM | POA: Diagnosis not present

## 2017-04-25 DIAGNOSIS — J069 Acute upper respiratory infection, unspecified: Secondary | ICD-10-CM | POA: Diagnosis not present

## 2017-04-25 DIAGNOSIS — R05 Cough: Secondary | ICD-10-CM | POA: Diagnosis not present

## 2017-06-09 DIAGNOSIS — H2513 Age-related nuclear cataract, bilateral: Secondary | ICD-10-CM | POA: Diagnosis not present

## 2017-06-28 DIAGNOSIS — E559 Vitamin D deficiency, unspecified: Secondary | ICD-10-CM | POA: Diagnosis not present

## 2017-06-28 DIAGNOSIS — E785 Hyperlipidemia, unspecified: Secondary | ICD-10-CM | POA: Diagnosis not present

## 2017-06-28 DIAGNOSIS — Z0000 Encounter for general adult medical examination without abnormal findings: Secondary | ICD-10-CM | POA: Diagnosis not present

## 2017-06-28 DIAGNOSIS — R7309 Other abnormal glucose: Secondary | ICD-10-CM | POA: Diagnosis not present

## 2017-06-28 DIAGNOSIS — I129 Hypertensive chronic kidney disease with stage 1 through stage 4 chronic kidney disease, or unspecified chronic kidney disease: Secondary | ICD-10-CM | POA: Diagnosis not present

## 2017-06-28 DIAGNOSIS — N182 Chronic kidney disease, stage 2 (mild): Secondary | ICD-10-CM | POA: Diagnosis not present

## 2017-06-28 DIAGNOSIS — Z1389 Encounter for screening for other disorder: Secondary | ICD-10-CM | POA: Diagnosis not present

## 2017-08-07 DIAGNOSIS — Z23 Encounter for immunization: Secondary | ICD-10-CM | POA: Diagnosis not present

## 2017-10-28 DIAGNOSIS — J069 Acute upper respiratory infection, unspecified: Secondary | ICD-10-CM | POA: Diagnosis not present

## 2017-12-07 DIAGNOSIS — R350 Frequency of micturition: Secondary | ICD-10-CM | POA: Diagnosis not present

## 2017-12-07 DIAGNOSIS — N401 Enlarged prostate with lower urinary tract symptoms: Secondary | ICD-10-CM | POA: Diagnosis not present

## 2017-12-07 DIAGNOSIS — R3915 Urgency of urination: Secondary | ICD-10-CM | POA: Diagnosis not present

## 2017-12-27 DIAGNOSIS — R972 Elevated prostate specific antigen [PSA]: Secondary | ICD-10-CM | POA: Diagnosis not present

## 2018-01-03 DIAGNOSIS — R3914 Feeling of incomplete bladder emptying: Secondary | ICD-10-CM | POA: Diagnosis not present

## 2018-01-03 DIAGNOSIS — R350 Frequency of micturition: Secondary | ICD-10-CM | POA: Diagnosis not present

## 2018-01-03 DIAGNOSIS — R972 Elevated prostate specific antigen [PSA]: Secondary | ICD-10-CM | POA: Diagnosis not present

## 2018-01-03 DIAGNOSIS — N401 Enlarged prostate with lower urinary tract symptoms: Secondary | ICD-10-CM | POA: Diagnosis not present

## 2018-01-10 DIAGNOSIS — R7309 Other abnormal glucose: Secondary | ICD-10-CM | POA: Diagnosis not present

## 2018-01-10 DIAGNOSIS — I129 Hypertensive chronic kidney disease with stage 1 through stage 4 chronic kidney disease, or unspecified chronic kidney disease: Secondary | ICD-10-CM | POA: Diagnosis not present

## 2018-01-10 DIAGNOSIS — N182 Chronic kidney disease, stage 2 (mild): Secondary | ICD-10-CM | POA: Diagnosis not present

## 2018-01-10 DIAGNOSIS — E785 Hyperlipidemia, unspecified: Secondary | ICD-10-CM | POA: Diagnosis not present

## 2018-01-15 DIAGNOSIS — I10 Essential (primary) hypertension: Secondary | ICD-10-CM | POA: Diagnosis not present

## 2018-01-15 DIAGNOSIS — Z86010 Personal history of colonic polyps: Secondary | ICD-10-CM | POA: Diagnosis not present

## 2018-01-15 DIAGNOSIS — E785 Hyperlipidemia, unspecified: Secondary | ICD-10-CM | POA: Diagnosis not present

## 2018-01-30 DIAGNOSIS — K5730 Diverticulosis of large intestine without perforation or abscess without bleeding: Secondary | ICD-10-CM | POA: Diagnosis not present

## 2018-01-30 DIAGNOSIS — D122 Benign neoplasm of ascending colon: Secondary | ICD-10-CM | POA: Diagnosis not present

## 2018-01-30 DIAGNOSIS — K635 Polyp of colon: Secondary | ICD-10-CM | POA: Diagnosis not present

## 2018-01-30 DIAGNOSIS — Z86010 Personal history of colonic polyps: Secondary | ICD-10-CM | POA: Diagnosis not present

## 2018-02-07 DIAGNOSIS — N182 Chronic kidney disease, stage 2 (mild): Secondary | ICD-10-CM | POA: Diagnosis not present

## 2018-02-15 DIAGNOSIS — J309 Allergic rhinitis, unspecified: Secondary | ICD-10-CM | POA: Diagnosis not present

## 2018-03-02 DIAGNOSIS — G4733 Obstructive sleep apnea (adult) (pediatric): Secondary | ICD-10-CM | POA: Diagnosis not present

## 2018-05-14 DIAGNOSIS — M1712 Unilateral primary osteoarthritis, left knee: Secondary | ICD-10-CM | POA: Diagnosis not present

## 2018-05-14 DIAGNOSIS — M25561 Pain in right knee: Secondary | ICD-10-CM | POA: Diagnosis not present

## 2018-05-14 DIAGNOSIS — M1711 Unilateral primary osteoarthritis, right knee: Secondary | ICD-10-CM | POA: Diagnosis not present

## 2018-05-14 DIAGNOSIS — M25562 Pain in left knee: Secondary | ICD-10-CM | POA: Diagnosis not present

## 2018-06-01 DIAGNOSIS — J019 Acute sinusitis, unspecified: Secondary | ICD-10-CM | POA: Diagnosis not present

## 2018-08-20 DIAGNOSIS — R7309 Other abnormal glucose: Secondary | ICD-10-CM

## 2018-08-20 DIAGNOSIS — E559 Vitamin D deficiency, unspecified: Secondary | ICD-10-CM

## 2018-08-20 DIAGNOSIS — I129 Hypertensive chronic kidney disease with stage 1 through stage 4 chronic kidney disease, or unspecified chronic kidney disease: Secondary | ICD-10-CM

## 2018-08-20 DIAGNOSIS — N182 Chronic kidney disease, stage 2 (mild): Secondary | ICD-10-CM

## 2018-08-20 DIAGNOSIS — E785 Hyperlipidemia, unspecified: Secondary | ICD-10-CM

## 2018-08-20 DIAGNOSIS — Z23 Encounter for immunization: Secondary | ICD-10-CM

## 2018-08-20 DIAGNOSIS — Z0000 Encounter for general adult medical examination without abnormal findings: Secondary | ICD-10-CM

## 2018-10-13 ENCOUNTER — Other Ambulatory Visit: Payer: Self-pay | Admitting: Internal Medicine

## 2018-11-16 ENCOUNTER — Ambulatory Visit: Payer: Medicare Other | Admitting: Internal Medicine

## 2018-11-16 ENCOUNTER — Ambulatory Visit (INDEPENDENT_AMBULATORY_CARE_PROVIDER_SITE_OTHER): Payer: Medicare Other | Admitting: Nurse Practitioner

## 2018-11-16 ENCOUNTER — Ambulatory Visit: Payer: Self-pay

## 2018-11-16 ENCOUNTER — Encounter: Payer: Self-pay | Admitting: Nurse Practitioner

## 2018-11-16 VITALS — BP 124/80 | HR 67 | Temp 98.0°F | Ht 75.0 in | Wt 244.6 lb

## 2018-11-16 DIAGNOSIS — Z113 Encounter for screening for infections with a predominantly sexual mode of transmission: Secondary | ICD-10-CM

## 2018-11-16 DIAGNOSIS — Z7689 Persons encountering health services in other specified circumstances: Secondary | ICD-10-CM

## 2018-11-16 DIAGNOSIS — Z114 Encounter for screening for human immunodeficiency virus [HIV]: Secondary | ICD-10-CM | POA: Diagnosis not present

## 2018-11-16 NOTE — Progress Notes (Signed)
  Subjective:     Patient ID: Edward Gallagher , male    DOB: 11/27/1941 , 77 y.o.   MRN: [id number]   Chief Complaint  Patient presents with  . STD Testing    patient states his wife was tested and she had it so he would like to be checked    HPI  Here today for STD testing.  His wife was diagnosed with HSV unsure of which type.  She does have a history of Lupus.  He denies any current symptoms.    Past Medical History:  Diagnosis Date  . Allergy   . Hyperlipidemia   . Hypertension   . Prostatic hypertrophy   . Sinus trouble   . Sleep apnea      Family History  Problem Relation Age of Onset  . Hypertension Other   . Prostate cancer Father   . Testicular cancer Brother   . Prostate cancer Brother   . Lung cancer Brother   . Cancer Brother        double mynoma     Current Outpatient Medications:  .  aspirin 81 MG tablet, Take 81 mg by mouth daily., Disp: , Rfl:  .  atorvastatin (LIPITOR) 20 MG tablet, TAKE 1 TABLET BY MOUTH  EVERY DAY, Disp: 90 tablet, Rfl: 0 .  Cholecalciferol (VITAMIN D3) 2000 UNITS TABS, Take 1 tablet by mouth daily., Disp: , Rfl:  .  finasteride (PROSCAR) 5 MG tablet, Take 5 mg by mouth daily., Disp: , Rfl:  .  hydrochlorothiazide (MICROZIDE) 12.5 MG capsule, TAKE 1 CAPSULE BY MOUTH  EVERY DAY, Disp: 90 capsule, Rfl: 0 .  Krill Oil 1000 MG CAPS, Take 1,000 mg by mouth daily., Disp: , Rfl:  .  Multiple Vitamin (MULTIVITAMIN) tablet, Take 1 tablet by mouth daily., Disp: , Rfl:  .  penicillin v potassium (VEETID) 500 MG tablet, Take 500 mg by mouth 4 (four) times daily., Disp: , Rfl:  .  terazosin (HYTRIN) 5 MG capsule, TAKE 1 CAPSULE BY MOUTH  EVERY DAY AT BEDTIME, Disp: 90 capsule, Rfl: 0   No Known Allergies   Review of Systems  HENT: Negative.   Respiratory: Negative.   Cardiovascular: Negative.   Musculoskeletal: Negative.   Psychiatric/Behavioral: Negative.      Today's Vitals   11/16/18 1209  BP: 124/80  Pulse: 67  Temp: 98 F (36.7  C)  TempSrc: Oral  SpO2: 97%  Weight: 244 lb 9.6 oz (110.9 kg)  Height: 6\' 3"  (1.905 m)  PainSc: 0-No pain   Body mass index is 30.57 kg/m.   Objective:  Physical Exam  Constitutional: He is oriented to person, place, and time. He appears well-developed and well-nourished.  Pulmonary/Chest: Effort normal.  Neurological: He is alert and oriented to person, place, and time.  Skin: Skin is dry.  Vitals reviewed.       Assessment And Plan:     1. Encounter for assessment of STD exposure  Will check STD panel due to his wife being diagnosed with HSV unsure of which type.  No current symptoms - HIV antibody (with reflex) - HSV(herpes smplx)abs-1+2(IgG+IgM)-bld - Ct/Ng NAA rfx Tv NAA - T pallidum Screening [place]       Minette Brine, FNP

## 2018-11-20 LAB — HSV(HERPES SMPLX)ABS-I+II(IGG+IGM)-BLD
HSV 1 Glycoprotein G Ab, IgG: >62.2 index — ABNORMAL HIGH (ref 0.00–0.90)
HSV 2 IgG, Type Spec: <0.91 index (ref 0.00–0.90)
HSVI/II Comb IgM: <0.91 Ratio (ref 0.00–0.90)

## 2018-11-20 LAB — T PALLIDUM SCREENING CASCADE: T pallidum Antibodies (TP-PA): NONREACTIVE

## 2018-11-20 LAB — CT/NG NAA RFX TV NAA
Chlamydia by NAA: NEGATIVE
Gonococcus by NAA: NEGATIVE

## 2018-11-20 LAB — HIV ANTIBODY (ROUTINE TESTING W REFLEX): HIV Screen 4th Generation wRfx: NONREACTIVE

## 2018-11-21 ENCOUNTER — Telehealth: Payer: Self-pay

## 2018-11-21 ENCOUNTER — Encounter: Payer: Self-pay | Admitting: Internal Medicine

## 2018-11-21 NOTE — Telephone Encounter (Signed)
I called the pt after getting the message in MyChart and the pt was notified of his lab results.

## 2019-01-02 DIAGNOSIS — B9689 Other specified bacterial agents as the cause of diseases classified elsewhere: Secondary | ICD-10-CM | POA: Diagnosis not present

## 2019-01-02 DIAGNOSIS — J209 Acute bronchitis, unspecified: Secondary | ICD-10-CM | POA: Diagnosis not present

## 2019-01-02 DIAGNOSIS — J019 Acute sinusitis, unspecified: Secondary | ICD-10-CM | POA: Diagnosis not present

## 2019-01-06 ENCOUNTER — Other Ambulatory Visit: Payer: Self-pay | Admitting: Nurse Practitioner

## 2019-01-11 ENCOUNTER — Other Ambulatory Visit: Payer: Self-pay | Admitting: Nurse Practitioner

## 2019-01-11 MED ORDER — AZELASTINE HCL 0.1 % NA SOLN: 2.0000 | Freq: Two times a day (BID) | NASAL | 6 refills | Status: DC

## 2019-02-11 ENCOUNTER — Other Ambulatory Visit: Payer: Self-pay | Admitting: Internal Medicine

## 2019-02-18 ENCOUNTER — Ambulatory Visit (INDEPENDENT_AMBULATORY_CARE_PROVIDER_SITE_OTHER): Payer: Medicare Other | Admitting: Internal Medicine

## 2019-02-18 ENCOUNTER — Encounter: Payer: Self-pay | Admitting: Internal Medicine

## 2019-02-18 VITALS — BP 132/80 | HR 63 | Temp 98.6°F | Ht 75.0 in | Wt 250.4 lb

## 2019-02-18 DIAGNOSIS — I129 Hypertensive chronic kidney disease with stage 1 through stage 4 chronic kidney disease, or unspecified chronic kidney disease: Secondary | ICD-10-CM

## 2019-02-18 DIAGNOSIS — R7309 Other abnormal glucose: Secondary | ICD-10-CM

## 2019-02-18 DIAGNOSIS — N182 Chronic kidney disease, stage 2 (mild): Secondary | ICD-10-CM | POA: Diagnosis not present

## 2019-02-18 DIAGNOSIS — E6609 Other obesity due to excess calories: Secondary | ICD-10-CM

## 2019-02-18 DIAGNOSIS — Z6831 Body mass index (BMI) 31.0-31.9, adult: Secondary | ICD-10-CM

## 2019-02-18 DIAGNOSIS — E7800 Pure hypercholesterolemia, unspecified: Secondary | ICD-10-CM

## 2019-02-18 NOTE — Progress Notes (Signed)
Subjective:     Patient ID: Edward Gallagher , male    DOB: 1941/10/19 , 78 y.o.   MRN: [id number]   Chief Complaint  Patient presents with  . Hypertension    HPI  Hypertension  This is a chronic problem. The current episode started more than 1 year ago. The problem has been gradually improving since onset. The problem is controlled. Pertinent negatives include no blurred vision, chest pain, palpitations or shortness of breath. Risk factors for coronary artery disease include dyslipidemia, male gender and obesity. The current treatment provides moderate improvement. There are no compliance problems.  Hypertensive end-organ damage includes kidney disease.     Past Medical History:  Diagnosis Date  . Allergy   . Hyperlipidemia   . Hypertension   . Prostatic hypertrophy   . Sinus trouble   . Sleep apnea      Family History  Problem Relation Age of Onset  . Hypertension Other   . Prostate cancer Father   . Testicular cancer Brother   . Prostate cancer Brother   . Lung cancer Brother   . Cancer Brother        double mynoma  . Healthy Mother      Current Outpatient Medications:  .  aspirin 81 MG tablet, Take 81 mg by mouth daily., Disp: , Rfl:  .  atorvastatin (LIPITOR) 20 MG tablet, TAKE 1 TABLET BY MOUTH  EVERY DAY, Disp: 90 tablet, Rfl: 0 .  azelastine (ASTELIN) 0.1 % nasal spray, Place 2 sprays into both nostrils 2 (two) times daily. Use in each nostril as directed, Disp: 30 mL, Rfl: 6 .  Cholecalciferol (VITAMIN D3) 2000 UNITS TABS, Take 1 tablet by mouth daily., Disp: , Rfl:  .  finasteride (PROSCAR) 5 MG tablet, Take 5 mg by mouth daily., Disp: , Rfl:  .  hydrochlorothiazide (MICROZIDE) 12.5 MG capsule, TAKE 1 CAPSULE BY MOUTH  EVERY DAY, Disp: 90 capsule, Rfl: 0 .  Krill Oil 1000 MG CAPS, Take 1,000 mg by mouth daily., Disp: , Rfl:  .  Multiple Vitamin (MULTIVITAMIN) tablet, Take 1 tablet by mouth daily., Disp: , Rfl:  .  penicillin v potassium (VEETID) 500 MG tablet,  Take 500 mg by mouth 4 (four) times daily., Disp: , Rfl:  .  terazosin (HYTRIN) 5 MG capsule, TAKE 1 CAPSULE BY MOUTH  EVERY DAY AT BEDTIME, Disp: 90 capsule, Rfl: 0   No Known Allergies   Review of Systems  Constitutional: Negative.   Eyes: Negative for blurred vision.  Respiratory: Negative.  Negative for shortness of breath.   Cardiovascular: Negative.  Negative for chest pain and palpitations.  Gastrointestinal: Negative.   Neurological: Negative.   Psychiatric/Behavioral: Negative.      Today's Vitals   02/18/19 0858  BP: 132/80  Pulse: 63  Temp: 98.6 F (37 C)  TempSrc: Oral  Weight: 250 lb 6.4 oz (113.6 kg)  Height: '6\' 3"'  (1.905 m)  PainSc: 0-No pain   Body mass index is 31.3 kg/m.   Objective:  Physical Exam Vitals signs and nursing note reviewed.  Constitutional:      Appearance: Normal appearance.  Cardiovascular:     Rate and Rhythm: Normal rate and regular rhythm.     Heart sounds: Normal heart sounds.  Pulmonary:     Effort: Pulmonary effort is normal.     Breath sounds: Normal breath sounds.  Skin:    General: Skin is warm.  Neurological:     General: No focal deficit  present.     Mental Status: He is alert.  Psychiatric:        Mood and Affect: Mood normal.         Assessment And Plan:     1. Hypertensive nephropathy  Controlled. He is encouraged to avoid adding salt to his foods.  He will continue with current meds.   - CMP14+EGFR  2. Chronic renal disease, stage II  Chronic.He is encouraged to stay well hydrated.   3. Pure hypercholesterolemia  I will check fasting lipid panel today. Refills of meds will be placed. He will rto in Oct 2020 for his next AWV and physical exam.   - Lipid panel  4. Other abnormal glucose  HIS A1C HAS BEEN ELEVATED IN THE PAST. I WILL CHECK AN A1C, BMET TODAY. HE WAS ENCOURAGED TO AVOID SUGARY BEVERAGES AND PROCESSED FOODS INCLUDNG BREADS, RICE AND PASTA.  - Hemoglobin A1c  5. 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 He reports exercising five days per week.        Maximino Greenland, MD

## 2019-02-18 NOTE — Patient Instructions (Signed)

## 2019-02-19 ENCOUNTER — Telehealth: Payer: Self-pay

## 2019-02-19 LAB — CMP14+EGFR
ALT: 23 IU/L (ref 0–44)
AST: 27 IU/L (ref 0–40)
Albumin/Globulin Ratio: 1.3 (ref 1.2–2.2)
Albumin: 3.9 g/dL (ref 3.7–4.7)
Alkaline Phosphatase: 74 IU/L (ref 39–117)
BUN/Creatinine Ratio: 8 — ABNORMAL LOW (ref 10–24)
BUN: 10 mg/dL (ref 8–27)
Bilirubin Total: 0.5 mg/dL (ref 0.0–1.2)
CO2: 25 mmol/L (ref 20–29)
Calcium: 9.2 mg/dL (ref 8.6–10.2)
Chloride: 103 mmol/L (ref 96–106)
Creatinine, Ser: 1.26 mg/dL (ref 0.76–1.27)
GFR calc Af Amer: 63 mL/min/{1.73_m2} (ref >59)
GFR calc non Af Amer: 55 mL/min/{1.73_m2} — ABNORMAL LOW (ref >59)
Globulin, Total: 2.9 g/dL (ref 1.5–4.5)
Glucose: 107 mg/dL — ABNORMAL HIGH (ref 65–99)
Potassium: 4.7 mmol/L (ref 3.5–5.2)
Sodium: 143 mmol/L (ref 134–144)
Total Protein: 6.8 g/dL (ref 6.0–8.5)

## 2019-02-19 LAB — LIPID PANEL
Chol/HDL Ratio: 3.2 ratio (ref 0.0–5.0)
Cholesterol, Total: 146 mg/dL (ref 100–199)
HDL: 45 mg/dL (ref >39)
LDL Calculated: 86 mg/dL (ref 0–99)
Triglycerides: 73 mg/dL (ref 0–149)
VLDL Cholesterol Cal: 15 mg/dL (ref 5–40)

## 2019-02-19 LAB — HEMOGLOBIN A1C
Est. average glucose Bld gHb Est-mCnc: 126 mg/dL
Hgb A1c MFr Bld: 6 % — ABNORMAL HIGH (ref 4.8–5.6)

## 2019-02-19 NOTE — Telephone Encounter (Signed)
The patient was given his most recent lab results.  

## 2019-04-28 ENCOUNTER — Other Ambulatory Visit: Payer: Self-pay | Admitting: Internal Medicine

## 2019-05-08 ENCOUNTER — Other Ambulatory Visit: Payer: Self-pay

## 2019-05-08 ENCOUNTER — Other Ambulatory Visit: Payer: Self-pay | Admitting: Internal Medicine

## 2019-05-08 ENCOUNTER — Ambulatory Visit (INDEPENDENT_AMBULATORY_CARE_PROVIDER_SITE_OTHER): Payer: Medicare Other

## 2019-05-08 VITALS — BP 138/78 | HR 70 | Temp 98.6°F | Ht 74.2 in | Wt 250.6 lb

## 2019-05-08 DIAGNOSIS — Z0000 Encounter for general adult medical examination without abnormal findings: Secondary | ICD-10-CM

## 2019-05-08 DIAGNOSIS — I129 Hypertensive chronic kidney disease with stage 1 through stage 4 chronic kidney disease, or unspecified chronic kidney disease: Secondary | ICD-10-CM | POA: Diagnosis not present

## 2019-05-08 LAB — POCT URINALYSIS DIPSTICK
Bilirubin, UA: NEGATIVE
Blood, UA: NEGATIVE
Glucose, UA: NEGATIVE
Ketones, UA: NEGATIVE
Leukocytes, UA: NEGATIVE
Nitrite, UA: NEGATIVE
Protein, UA: NEGATIVE
Spec Grav, UA: 1.02 (ref 1.010–1.025)
Urobilinogen, UA: 0.2 E.U./dL
pH, UA: 6.5 (ref 5.0–8.0)

## 2019-05-08 LAB — POCT UA - MICROALBUMIN
Albumin/Creatinine Ratio, Urine, POC: <30
Creatinine, POC: 300 mg/dL
Microalbumin Ur, POC: 10 mg/L

## 2019-05-08 MED ORDER — FLUTICASONE PROPIONATE 50 MCG/ACT NA SUSP: 1.0000 | Freq: Every day | NASAL | 2 refills | Status: DC

## 2019-05-08 NOTE — Patient Instructions (Signed)
Edward Gallagher , Thank you for taking time to come for your Medicare Wellness Visit. I appreciate your ongoing commitment to your health goals. Please review the following plan we discussed and let me know if I can assist you in the future.   Screening recommendations/referrals: Colonoscopy: not required Recommended yearly ophthalmology/optometry visit for glaucoma screening and checkup Recommended yearly dental visit for hygiene and checkup  Vaccinations: Influenza vaccine: 08/2018 Pneumococcal vaccine: 08/2015 Tdap vaccine: 12/2012 Shingles vaccine: 12/2014    Advanced directives: Advance directive discussed with you today. I have provided a copy for you to complete at home and have notarized. Once this is complete please bring a copy in to our office so we can scan it into your chart.   Conditions/risks identified: Obesity  Next appointment: 09/25/2019 at 9:15  Preventive Care 65 Years and Older, Male Preventive care refers to lifestyle choices and visits with your health care provider that can promote health and wellness. What does preventive care include?  A yearly physical exam. This is also called an annual well check.  Dental exams once or twice a year.  Routine eye exams. Ask your health care provider how often you should have your eyes checked.  Personal lifestyle choices, including:  Daily care of your teeth and gums.  Regular physical activity.  Eating a healthy diet.  Avoiding tobacco and drug use.  Limiting alcohol use.  Practicing safe sex.  Taking low doses of aspirin every day.  Taking vitamin and mineral supplements as recommended by your health care provider. What happens during an annual well check? The services and screenings done by your health care provider during your annual well check will depend on your age, overall health, lifestyle risk factors, and family history of disease. Counseling  Your health care provider may ask you questions about your:   Alcohol use.  Tobacco use.  Drug use.  Emotional well-being.  Home and relationship well-being.  Sexual activity.  Eating habits.  History of falls.  Memory and ability to understand (cognition).  Work and work Statistician. Screening  You may have the following tests or measurements:  Height, weight, and BMI.  Blood pressure.  Lipid and cholesterol levels. These may be checked every 5 years, or more frequently if you are over 63 years old.  Skin check.  Lung cancer screening. You may have this screening every year starting at age 38 if you have a 30-pack-year history of smoking and currently smoke or have quit within the past 15 years.  Fecal occult blood test (FOBT) of the stool. You may have this test every year starting at age 90.  Flexible sigmoidoscopy or colonoscopy. You may have a sigmoidoscopy every 5 years or a colonoscopy every 10 years starting at age 34.  Prostate cancer screening. Recommendations will vary depending on your family history and other risks.  Hepatitis C blood test.  Hepatitis B blood test.  Sexually transmitted disease (STD) testing.  Diabetes screening. This is done by checking your blood sugar (glucose) after you have not eaten for a while (fasting). You may have this done every 1-3 years.  Abdominal aortic aneurysm (AAA) screening. You may need this if you are a current or former smoker.  Osteoporosis. You may be screened starting at age 52 if you are at high risk. Talk with your health care provider about your test results, treatment options, and if necessary, the need for more tests. Vaccines  Your health care provider may recommend certain vaccines, such as:  Influenza vaccine. This is recommended every year.  Tetanus, diphtheria, and acellular pertussis (Tdap, Td) vaccine. You may need a Td booster every 10 years.  Zoster vaccine. You may need this after age 41.  Pneumococcal 13-valent conjugate (PCV13) vaccine. One dose  is recommended after age 56.  Pneumococcal polysaccharide (PPSV23) vaccine. One dose is recommended after age 89. Talk to your health care provider about which screenings and vaccines you need and how often you need them. This information is not intended to replace advice given to you by your health care provider. Make sure you discuss any questions you have with your health care provider. Document Released: 12/25/2015 Document Revised: 08/17/2016 Document Reviewed: 09/29/2015 Elsevier Interactive Patient Education  2017 [employer] Prevention in the Home Falls can cause injuries. They can happen to people of all ages. There are many things you can do to make your home safe and to help prevent falls. What can I do on the outside of my home?  Regularly fix the edges of walkways and driveways and fix any cracks.  Remove anything that might make you trip as you walk through a door, such as a raised step or threshold.  Trim any bushes or trees on the path to your home.  Use bright outdoor lighting.  Clear any walking paths of anything that might make someone trip, such as rocks or tools.  Regularly check to see if handrails are loose or broken. Make sure that both sides of any steps have handrails.  Any raised decks and porches should have guardrails on the edges.  Have any leaves, snow, or ice cleared regularly.  Use sand or salt on walking paths during winter.  Clean up any spills in your garage right away. This includes oil or grease spills. What can I do in the bathroom?  Use night lights.  Install grab bars by the toilet and in the tub and shower. Do not use towel bars as grab bars.  Use non-skid mats or decals in the tub or shower.  If you need to sit down in the shower, use a plastic, non-slip stool.  Keep the floor dry. Clean up any water that spills on the floor as soon as it happens.  Remove soap buildup in the tub or shower regularly.  Attach bath mats  securely with double-sided non-slip rug tape.  Do not have throw rugs and other things on the floor that can make you trip. What can I do in the bedroom?  Use night lights.  Make sure that you have a light by your bed that is easy to reach.  Do not use any sheets or blankets that are too big for your bed. They should not hang down onto the floor.  Have a firm chair that has side arms. You can use this for support while you get dressed.  Do not have throw rugs and other things on the floor that can make you trip. What can I do in the kitchen?  Clean up any spills right away.  Avoid walking on wet floors.  Keep items that you use a lot in easy-to-reach places.  If you need to reach something above you, use a strong step stool that has a grab bar.  Keep electrical cords out of the way.  Do not use floor polish or wax that makes floors slippery. If you must use wax, use non-skid floor wax.  Do not have throw rugs and other things on the floor that  can make you trip. What can I do with my stairs?  Do not leave any items on the stairs.  Make sure that there are handrails on both sides of the stairs and use them. Fix handrails that are broken or loose. Make sure that handrails are as long as the stairways.  Check any carpeting to make sure that it is firmly attached to the stairs. Fix any carpet that is loose or worn.  Avoid having throw rugs at the top or bottom of the stairs. If you do have throw rugs, attach them to the floor with carpet tape.  Make sure that you have a light switch at the top of the stairs and the bottom of the stairs. If you do not have them, ask someone to add them for you. What else can I do to help prevent falls?  Wear shoes that:  Do not have high heels.  Have rubber bottoms.  Are comfortable and fit you well.  Are closed at the toe. Do not wear sandals.  If you use a stepladder:  Make sure that it is fully opened. Do not climb a closed  stepladder.  Make sure that both sides of the stepladder are locked into place.  Ask someone to hold it for you, if possible.  Clearly mark and make sure that you can see:  Any grab bars or handrails.  First and last steps.  Where the edge of each step is.  Use tools that help you move around (mobility aids) if they are needed. These include:  Canes.  Walkers.  Scooters.  Crutches.  Turn on the lights when you go into a dark area. Replace any light bulbs as soon as they burn out.  Set up your furniture so you have a clear path. Avoid moving your furniture around.  If any of your floors are uneven, fix them.  If there are any pets around you, be aware of where they are.  Review your medicines with your doctor. Some medicines can make you feel dizzy. This can increase your chance of falling. Ask your doctor what other things that you can do to help prevent falls. This information is not intended to replace advice given to you by your health care provider. Make sure you discuss any questions you have with your health care provider. Document Released: 09/24/2009 Document Revised: 05/05/2016 Document Reviewed: 01/02/2015 Elsevier Interactive Patient Education  2017 [employer].

## 2019-05-08 NOTE — Progress Notes (Signed)
Subjective:   Edward Gallagher is a 78 y.o. male who presents for Medicare Annual/Subsequent preventive examination.  Review of Systems:  n/a Cardiac Risk Factors include: advanced age (>74men, >21 women);obesity (BMI >30kg/m2)     Objective:    Vitals: BP 138/78 (BP Location: Left Arm, Patient Position: Sitting)   Pulse 70   Temp 98.6 F (37 C) (Oral)   Ht 6' 2.2" (1.885 m)   Wt 250 lb 9.6 oz (113.7 kg)   SpO2 98%   BMI 32.00 kg/m   Body mass index is 32 kg/m.  Advanced Directives 05/08/2019  Does Patient Have a Medical Advance Directive? No  Would patient like information on creating a medical advance directive? Yes (MAU/Ambulatory/Procedural Areas - Information given)    Tobacco Social History   Tobacco Use  Smoking Status Former Smoker  . Packs/day: 0.50  . Years: 35.00  . Pack years: 17.50  . Types: Cigarettes  . Last attempt to quit: 12/12/1997  . Years since quitting: 21.4  Smokeless Tobacco Never Used  Tobacco Comment   12 cigs daily     Counseling given: Not Answered Comment: 12 cigs daily   Clinical Intake:  Pre-visit preparation completed: Yes  Pain : No/denies pain Pain Score: 0-No pain     Nutritional Status: BMI > 30  Obese Nutritional Risks: None Diabetes: No  How often do you need to have someone help you when you read instructions, pamphlets, or other written materials from your doctor or pharmacy?: 1 - Never What is the last grade level you completed in school?: some college  Interpreter Needed?: No  Information entered by :: NAllen LPN  Past Medical History:  Diagnosis Date  . Allergy   . Hyperlipidemia   . Hypertension   . Prostatic hypertrophy   . Sinus trouble   . Sleep apnea    Past Surgical History:  Procedure Laterality Date  . HERNIA REPAIR    . VASECTOMY     Family History  Problem Relation Age of Onset  . Hypertension Other   . Prostate cancer Father   . Testicular cancer Brother   . Prostate cancer Brother    . Lung cancer Brother   . Cancer Brother        double mynoma  . Healthy Mother    Social History   Socioeconomic History  . Marital status: Single    Spouse name: Not on file  . Number of children: 0  . Years of education: Not on file  . Highest education level: Not on file  Occupational History  . Occupation: retired  Scientific laboratory technician  . Financial resource strain: Not hard at all  . Food insecurity:    Worry: Never true    Inability: Never true  . Transportation needs:    Medical: No    Non-medical: No  Tobacco Use  . Smoking status: Former Smoker    Packs/day: 0.50    Years: 35.00    Pack years: 17.50    Types: Cigarettes    Last attempt to quit: 12/12/1997    Years since quitting: 21.4  . Smokeless tobacco: Never Used  . Tobacco comment: 12 cigs daily  Substance and Sexual Activity  . Alcohol use: Yes    Comment: ocasional beer  . Drug use: No  . Sexual activity: Not Currently  Lifestyle  . Physical activity:    Days per week: 7 days    Minutes per session: 60 min  . Stress: Not at  all  Relationships  . Social connections:    Talks on phone: Not on file    Gets together: Not on file    Attends religious service: Not on file    Active member of club or organization: Not on file    Attends meetings of clubs or organizations: Not on file    Relationship status: Not on file  Other Topics Concern  . Not on file  Social History Narrative  . Not on file    Outpatient Encounter Medications as of 05/08/2019  Medication Sig  . aspirin 81 MG tablet Take 81 mg by mouth daily.  Marland Kitchen atorvastatin (LIPITOR) 20 MG tablet TAKE 1 TABLET BY MOUTH  EVERY DAY  . azelastine (ASTELIN) 0.1 % nasal spray Place 2 sprays into both nostrils 2 (two) times daily. Use in each nostril as directed  . Cholecalciferol (VITAMIN D3) 2000 UNITS TABS Take 1 tablet by mouth daily.  . finasteride (PROSCAR) 5 MG tablet Take 5 mg by mouth daily.  . hydrochlorothiazide (MICROZIDE) 12.5 MG capsule  TAKE 1 CAPSULE BY MOUTH  EVERY DAY  . Krill Oil 1000 MG CAPS Take 1,000 mg by mouth daily.  . Multiple Vitamin (MULTIVITAMIN) tablet Take 1 tablet by mouth daily.  . penicillin v potassium (VEETID) 500 MG tablet Take 500 mg by mouth 4 (four) times daily.  Marland Kitchen terazosin (HYTRIN) 5 MG capsule TAKE 1 CAPSULE BY MOUTH  EVERY DAY AT BEDTIME   No facility-administered encounter medications on file as of 05/08/2019.     Activities of Daily Living In your present state of health, do you have any difficulty performing the following activities: 05/08/2019  Hearing? N  Vision? N  Difficulty concentrating or making decisions? N  Walking or climbing stairs? N  Dressing or bathing? N  Doing errands, shopping? N  Preparing Food and eating ? N  Using the Toilet? N  In the past six months, have you accidently leaked urine? N  Do you have problems with loss of bowel control? N  Managing your Medications? N  Managing your Finances? N  Housekeeping or managing your Housekeeping? N  Some recent data might be hidden    Patient Care Team: Glendale Chard, MD as PCP - General (Internal Medicine)   Assessment:   This is a routine wellness examination for Edward Gallagher.  Exercise Activities and Dietary recommendations    Goals    . Weight (lb) < 200 lb (90.7 kg)     Wants to lose 20 pounds       Fall Risk Fall Risk  05/08/2019 02/18/2019 11/16/2018  Falls in the past year? 0 0 0  Follow up Falls prevention discussed;Education provided - -   Is the patient's home free of loose throw rugs in walkways, pet beds, electrical cords, etc?   yes      Grab bars in the bathroom? yes      Handrails on the stairs?  n/a      Adequate lighting?   yes  Timed Get Up and Go Performed: n/a  Depression Screen PHQ 2/9 Scores 05/08/2019 02/18/2019 11/16/2018  PHQ - 2 Score 0 0 0  PHQ- 9 Score 0 - -    Cognitive Function     6CIT Screen 05/08/2019  What Year? 0 points  What month? 0 points  What time? 0 points  Count  back from 20 0 points  Months in reverse 0 points  Repeat phrase 0 points  Total Score 0  Immunization History  Administered Date(s) Administered  . Influenza-Unspecified 08/12/2014, 08/13/2015, 08/20/2018  . Pneumococcal Conjugate-13 08/13/2015  . Zoster 12/12/2013    Qualifies for Shingles Vaccine?  yes  Screening Tests Health Maintenance  Topic Date Due  . PNA vac Low Risk Adult (2 of 2 - PPSV23) 08/12/2016  . INFLUENZA VACCINE  07/13/2019  . TETANUS/TDAP  12/25/2022   Cancer Screenings: Lung: Low Dose CT Chest recommended if Age 61-80 years, 30 pack-year currently smoking OR have quit w/in 15years. Patient does not qualify. Colorectal: not required  Additional Screenings:  Hepatitis C Screening:n/a      Plan:    Wants to lose 20 pounds  I have personally reviewed and noted the following in the patient's chart:   . Medical and social history . Use of alcohol, tobacco or illicit drugs  . Current medications and supplements . Functional ability and status . Nutritional status . Physical activity . Advanced directives . List of other physicians . Hospitalizations, surgeries, and ER visits in previous 12 months . Vitals . Screenings to include cognitive, depression, and falls . Referrals and appointments  In addition, I have reviewed and discussed with patient certain preventive protocols, quality metrics, and best practice recommendations. A written personalized care plan for preventive services as well as general preventive health recommendations were provided to patient.     Kellie Simmering, LPN  [id number]

## 2019-05-30 DIAGNOSIS — G4733 Obstructive sleep apnea (adult) (pediatric): Secondary | ICD-10-CM | POA: Diagnosis not present

## 2019-07-01 ENCOUNTER — Other Ambulatory Visit: Payer: Self-pay | Admitting: Internal Medicine

## 2019-07-25 ENCOUNTER — Encounter: Payer: Self-pay | Admitting: Nurse Practitioner

## 2019-07-25 ENCOUNTER — Ambulatory Visit (INDEPENDENT_AMBULATORY_CARE_PROVIDER_SITE_OTHER): Payer: Medicare Other | Admitting: Nurse Practitioner

## 2019-07-25 ENCOUNTER — Other Ambulatory Visit: Payer: Self-pay

## 2019-07-25 VITALS — BP 122/70 | HR 72 | Temp 98.7°F | Ht 72.2 in | Wt 246.0 lb

## 2019-07-25 DIAGNOSIS — Z9989 Dependence on other enabling machines and devices: Secondary | ICD-10-CM

## 2019-07-25 DIAGNOSIS — G4733 Obstructive sleep apnea (adult) (pediatric): Secondary | ICD-10-CM

## 2019-07-25 NOTE — Patient Instructions (Signed)

## 2019-07-25 NOTE — Progress Notes (Signed)
Subjective:     Patient ID: Edward Gallagher , male    DOB: 02/06/1941 , 78 y.o.   MRN: [id number]   Chief Complaint  Patient presents with  . other    patient needs a prescriptilon for a cpap machine    HPI  Here today to be evaluated for a new CPAP machine due to the current one being broke.  He has his machine from [place].  He was diagnosed with sleep apnea in 1999.  Dr. Elsworth Soho  He reports his settings on his CPAP are 14cm pressure.  Currently has a Materials engineer from [place] for 3 weeks.  Still not   He has an improved quality of life.  When he does not use he is sluggish and fatigued.  Wears every night at least 8 hours and does not wake up when using.      Past Medical History:  Diagnosis Date  . Allergy   . Hyperlipidemia   . Hypertension   . Prostatic hypertrophy   . Sinus trouble   . Sleep apnea      Family History  Problem Relation Age of Onset  . Hypertension Other   . Prostate cancer Father   . Testicular cancer Brother   . Prostate cancer Brother   . Lung cancer Brother   . Cancer Brother        double mynoma  . Healthy Mother      Current Outpatient Medications:  .  aspirin 81 MG tablet, Take 81 mg by mouth daily., Disp: , Rfl:  .  atorvastatin (LIPITOR) 20 MG tablet, TAKE 1 TABLET BY MOUTH  DAILY, Disp: 90 tablet, Rfl: 0 .  finasteride (PROSCAR) 5 MG tablet, Take 5 mg by mouth daily., Disp: , Rfl:  .  hydrochlorothiazide (MICROZIDE) 12.5 MG capsule, TAKE 1 CAPSULE BY MOUTH  DAILY, Disp: 90 capsule, Rfl: 0 .  Krill Oil 1000 MG CAPS, Take 1,000 mg by mouth daily., Disp: , Rfl:  .  Multiple Vitamin (MULTIVITAMIN) tablet, Take 1 tablet by mouth daily., Disp: , Rfl:  .  terazosin (HYTRIN) 5 MG capsule, TAKE 1 CAPSULE BY MOUTH  DAILY AT BEDTIME, Disp: 90 capsule, Rfl: 0   No Known Allergies   Review of Systems  Constitutional: Negative.   Respiratory: Negative.   Cardiovascular: Negative.  Negative for chest pain, palpitations and leg swelling.  Musculoskeletal:  Negative.   Neurological: Negative for dizziness and headaches.  Psychiatric/Behavioral: Negative.      Today's Vitals   07/25/19 1441  BP: 122/70  Pulse: 72  Temp: 98.7 F (37.1 C)  TempSrc: Oral  Weight: 246 lb (111.6 kg)  Height: 6' 0.2" (1.834 m)  PainSc: 0-No pain   Body mass index is 33.18 kg/m.   Objective:  Physical Exam Vitals signs reviewed.  Constitutional:      Appearance: Normal appearance.  Cardiovascular:     Rate and Rhythm: Normal rate and regular rhythm.     Pulses: Normal pulses.     Heart sounds: Normal heart sounds. No murmur.  Pulmonary:     Effort: Pulmonary effort is normal. No respiratory distress.     Breath sounds: Normal breath sounds.  Skin:    General: Skin is warm and dry.     Capillary Refill: Capillary refill takes less than 2 seconds.  Neurological:     General: No focal deficit present.     Mental Status: He is alert and oriented to person, place, and time.  Psychiatric:  Mood and Affect: Mood normal.        Behavior: Behavior normal.        Thought Content: Thought content normal.        Judgment: Judgment normal.         Assessment And Plan:     1. OSA (obstructive sleep apnea)  Need a new machine, current one is broken.  He benefits from using his CPAP nightly and at least 8 hours per night improving his quality of life.    Will order a new machine from [facility] to inquire on process on ordering a new machine.   He does not need a sleep study at this time.    Minette Brine, FNP    THE PATIENT IS ENCOURAGED TO PRACTICE SOCIAL DISTANCING DUE TO THE COVID-19 PANDEMIC.

## 2019-07-30 DIAGNOSIS — G4733 Obstructive sleep apnea (adult) (pediatric): Secondary | ICD-10-CM | POA: Diagnosis not present

## 2019-08-04 ENCOUNTER — Other Ambulatory Visit: Payer: Self-pay | Admitting: Internal Medicine

## 2019-08-30 DIAGNOSIS — G4733 Obstructive sleep apnea (adult) (pediatric): Secondary | ICD-10-CM | POA: Diagnosis not present

## 2019-09-05 ENCOUNTER — Other Ambulatory Visit: Payer: Self-pay | Admitting: Internal Medicine

## 2019-09-25 ENCOUNTER — Other Ambulatory Visit: Payer: Self-pay

## 2019-09-25 ENCOUNTER — Encounter: Payer: Self-pay | Admitting: Internal Medicine

## 2019-09-25 ENCOUNTER — Ambulatory Visit: Payer: Medicare Other

## 2019-09-25 ENCOUNTER — Ambulatory Visit (INDEPENDENT_AMBULATORY_CARE_PROVIDER_SITE_OTHER): Payer: Medicare Other | Admitting: Internal Medicine

## 2019-09-25 VITALS — BP 140/78 | HR 72 | Temp 98.5°F | Ht 73.4 in | Wt 243.2 lb

## 2019-09-25 DIAGNOSIS — I77811 Abdominal aortic ectasia: Secondary | ICD-10-CM | POA: Diagnosis not present

## 2019-09-25 DIAGNOSIS — N182 Chronic kidney disease, stage 2 (mild): Secondary | ICD-10-CM

## 2019-09-25 DIAGNOSIS — I714 Abdominal aortic aneurysm, without rupture, unspecified: Secondary | ICD-10-CM | POA: Insufficient documentation

## 2019-09-25 DIAGNOSIS — R7309 Other abnormal glucose: Secondary | ICD-10-CM | POA: Diagnosis not present

## 2019-09-25 DIAGNOSIS — Z0000 Encounter for general adult medical examination without abnormal findings: Secondary | ICD-10-CM | POA: Diagnosis not present

## 2019-09-25 DIAGNOSIS — I129 Hypertensive chronic kidney disease with stage 1 through stage 4 chronic kidney disease, or unspecified chronic kidney disease: Secondary | ICD-10-CM | POA: Diagnosis not present

## 2019-09-25 DIAGNOSIS — K5900 Constipation, unspecified: Secondary | ICD-10-CM

## 2019-09-25 LAB — POCT URINALYSIS DIPSTICK
Bilirubin, UA: NEGATIVE
Blood, UA: NEGATIVE
Glucose, UA: NEGATIVE
Ketones, UA: NEGATIVE
Leukocytes, UA: NEGATIVE
Nitrite, UA: NEGATIVE
Protein, UA: NEGATIVE
Spec Grav, UA: 1.015 (ref 1.010–1.025)
Urobilinogen, UA: 0.2 E.U./dL
pH, UA: 6 (ref 5.0–8.0)

## 2019-09-25 LAB — CMP14+EGFR
ALT: 14 IU/L (ref 0–44)
AST: 18 IU/L (ref 0–40)
Albumin/Globulin Ratio: 1.6 (ref 1.2–2.2)
Albumin: 4.4 g/dL (ref 3.7–4.7)
Alkaline Phosphatase: 77 IU/L (ref 39–117)
BUN/Creatinine Ratio: 12 (ref 10–24)
BUN: 16 mg/dL (ref 8–27)
Bilirubin Total: 0.8 mg/dL (ref 0.0–1.2)
CO2: 26 mmol/L (ref 20–29)
Calcium: 9.6 mg/dL (ref 8.6–10.2)
Chloride: 103 mmol/L (ref 96–106)
Creatinine, Ser: 1.39 mg/dL — ABNORMAL HIGH (ref 0.76–1.27)
GFR calc Af Amer: 56 mL/min/{1.73_m2} — ABNORMAL LOW (ref >59)
GFR calc non Af Amer: 49 mL/min/{1.73_m2} — ABNORMAL LOW (ref >59)
Globulin, Total: 2.8 g/dL (ref 1.5–4.5)
Glucose: 102 mg/dL — ABNORMAL HIGH (ref 65–99)
Potassium: 4.4 mmol/L (ref 3.5–5.2)
Sodium: 142 mmol/L (ref 134–144)
Total Protein: 7.2 g/dL (ref 6.0–8.5)

## 2019-09-25 LAB — CBC
Hematocrit: 36.3 % — ABNORMAL LOW (ref 37.5–51.0)
Hemoglobin: 11.3 g/dL — ABNORMAL LOW (ref 13.0–17.7)
MCH: 25.1 pg — ABNORMAL LOW (ref 26.6–33.0)
MCHC: 31.1 g/dL — ABNORMAL LOW (ref 31.5–35.7)
MCV: 81 fL (ref 79–97)
Platelets: 229 10*3/uL (ref 150–450)
RBC: 4.5 x10E6/uL (ref 4.14–5.80)
RDW: 15.2 % (ref 11.6–15.4)
WBC: 3.2 10*3/uL — ABNORMAL LOW (ref 3.4–10.8)

## 2019-09-25 LAB — HEMOGLOBIN A1C
Est. average glucose Bld gHb Est-mCnc: 126 mg/dL
Hgb A1c MFr Bld: 6 % — ABNORMAL HIGH (ref 4.8–5.6)

## 2019-09-25 LAB — LIPID PANEL
Chol/HDL Ratio: 3 ratio (ref 0.0–5.0)
Cholesterol, Total: 157 mg/dL (ref 100–199)
HDL: 52 mg/dL (ref >39)
LDL Chol Calc (NIH): 91 mg/dL (ref 0–99)
Triglycerides: 75 mg/dL (ref 0–149)
VLDL Cholesterol Cal: 14 mg/dL (ref 5–40)

## 2019-09-25 LAB — POCT UA - MICROALBUMIN
Albumin/Creatinine Ratio, Urine, POC: <30
Creatinine, POC: 100 mg/dL
Microalbumin Ur, POC: 10 mg/L

## 2019-09-25 NOTE — Patient Instructions (Signed)

## 2019-09-29 DIAGNOSIS — G4733 Obstructive sleep apnea (adult) (pediatric): Secondary | ICD-10-CM | POA: Diagnosis not present

## 2019-10-05 ENCOUNTER — Encounter: Payer: Self-pay | Admitting: Internal Medicine

## 2019-10-05 NOTE — Progress Notes (Signed)
Subjective:     Patient ID: Edward Gallagher , male    DOB: June 16, 1941 , 78 y.o.   MRN: [id number]   Chief Complaint  Patient presents with  . Annual Exam  . Hypertension    HPI  He is here today for a full physical examination. He has no specific concerns at this time. He is alarmed that his bp is up, because this has been well controlled at home. He denies headaches, chest pain and visual disturbances.   Hypertension This is a chronic problem. The current episode started more than 1 year ago. The problem has been gradually improving since onset. The problem is uncontrolled. Pertinent negatives include no blurred vision, chest pain or shortness of breath. Risk factors for coronary artery disease include male gender. Past treatments include lifestyle changes and diuretics. The current treatment provides moderate improvement.     Past Medical History:  Diagnosis Date  . Allergy   . Hyperlipidemia   . Hypertension   . Prostatic hypertrophy   . Sinus trouble   . Sleep apnea      Family History  Problem Relation Age of Onset  . Hypertension Other   . Prostate cancer Father   . Testicular cancer Brother   . Prostate cancer Brother   . Lung cancer Brother   . Cancer Brother        double mynoma  . Healthy Mother      Current Outpatient Medications:  .  aspirin 81 MG tablet, Take 81 mg by mouth daily., Disp: , Rfl:  .  atorvastatin (LIPITOR) 20 MG tablet, TAKE 1 TABLET BY MOUTH  DAILY, Disp: 90 tablet, Rfl: 3 .  finasteride (PROSCAR) 5 MG tablet, Take 5 mg by mouth daily., Disp: , Rfl:  .  hydrochlorothiazide (MICROZIDE) 12.5 MG capsule, TAKE 1 CAPSULE BY MOUTH  DAILY, Disp: 90 capsule, Rfl: 3 .  Krill Oil 1000 MG CAPS, Take 1,000 mg by mouth daily., Disp: , Rfl:  .  Multiple Vitamin (MULTIVITAMIN) tablet, Take 1 tablet by mouth daily., Disp: , Rfl:  .  terazosin (HYTRIN) 5 MG capsule, TAKE 1 CAPSULE BY MOUTH  DAILY AT BEDTIME, Disp: 90 capsule, Rfl: 3   No Known Allergies    Men's preventive visit. Patient Health Questionnaire (PHQ-2) is    Office Visit from 09/25/2019 in [facility]  PHQ-2 Total Score  0    . Patient is on a healthy diet. Marital status: Single. Relevant history for alcohol use is:  Social History   Substance and Sexual Activity  Alcohol Use Yes   Comment: ocasional beer  . Relevant history for tobacco use is:  Social History   Tobacco Use  Smoking Status Former Smoker  . Packs/day: 0.50  . Years: 35.00  . Pack years: 17.50  . Types: Cigarettes  . Quit date: 12/12/1997  . Years since quitting: 21.8  Smokeless Tobacco Never Used  Tobacco Comment   12 cigs daily  .  Review of Systems  Constitutional: Negative.   HENT: Negative.   Eyes: Negative.  Negative for blurred vision.  Respiratory: Negative.  Negative for shortness of breath.   Cardiovascular: Negative.  Negative for chest pain.  Gastrointestinal: Positive for constipation.       He c/o constipation. He describes firm, hard stools. He iis not sure what may be contributing to his sx. He denies change in his eating habits. Admits he has not been as active recently.   Endocrine: Negative.  Genitourinary: Negative.   Musculoskeletal: Negative.   Skin: Negative.   Allergic/Immunologic: Negative.   Neurological: Negative.   Hematological: Negative.   Psychiatric/Behavioral: Negative.      Today's Vitals   09/25/19 0921  BP: 140/78  Pulse: 72  Temp: 98.5 F (36.9 C)  TempSrc: Oral  SpO2: 97%  Weight: 243 lb 3.2 oz (110.3 kg)  Height: 6' 1.4" (1.864 m)   Body mass index is 31.74 kg/m.   Objective:  Physical Exam Vitals signs and nursing note reviewed.  Constitutional:      Appearance: Normal appearance.  HENT:     Head: Normocephalic and atraumatic.     Right Ear: Tympanic membrane, ear canal and external ear normal.     Left Ear: Tympanic membrane, ear canal and external ear normal.     Nose: Nose normal.     Mouth/Throat:      Mouth: Mucous membranes are moist.     Pharynx: Oropharynx is clear.  Eyes:     Extraocular Movements: Extraocular movements intact.     Conjunctiva/sclera: Conjunctivae normal.     Pupils: Pupils are equal, round, and reactive to light.  Neck:     Musculoskeletal: Normal range of motion and neck supple.  Cardiovascular:     Rate and Rhythm: Normal rate and regular rhythm.     Pulses: Normal pulses.     Heart sounds: Normal heart sounds.  Pulmonary:     Effort: Pulmonary effort is normal.     Breath sounds: Normal breath sounds.  Chest:     Breasts:        Right: Normal. No swelling, bleeding, inverted nipple, mass or nipple discharge.        Left: Normal. No swelling, bleeding, inverted nipple, mass or nipple discharge.  Abdominal:     General: Abdomen is flat. Bowel sounds are normal.     Palpations: Abdomen is soft.  Genitourinary:    Comments: deferred Musculoskeletal: Normal range of motion.  Skin:    General: Skin is warm.  Neurological:     General: No focal deficit present.     Mental Status: He is alert.  Psychiatric:        Mood and Affect: Mood normal.        Behavior: Behavior normal.         Assessment And Plan:     1. Encounter for annual physical exam  A full exam was performed.  DRE deferred to his urologist. PATIENT HAS BEEN ADVISED TO GET 30-45 MINUTES REGULAR EXERCISE NO LESS THAN FOUR TO FIVE DAYS PER WEEK - BOTH WEIGHTBEARING EXERCISES AND AEROBIC ARE RECOMMENDED.  HEWAS ADVISED TO FOLLOW A HEALTHY DIET WITH AT LEAST SIX FRUITS/VEGGIES PER DAY, DECREASE INTAKE OF RED MEAT, AND TO INCREASE FISH INTAKE TO TWO DAYS PER WEEK.  MEATS/FISH SHOULD NOT BE FRIED, BAKED OR BROILED IS PREFERABLE.  I SUGGEST WEARING SPF 50 SUNSCREEN ON EXPOSED PARTS AND ESPECIALLY WHEN IN THE DIRECT SUNLIGHT FOR AN EXTENDED PERIOD OF TIME.  PLEASE AVOID FAST FOOD RESTAURANTS AND INCREASE YOUR WATER INTAKE.  - POCT Urinalysis Dipstick (81002) - POCT UA - Microalbumin - EKG  12-Lead  2. Hypertensive nephropathy  Chronic, fair control. He will continue with current meds for now. He is encouraged to follow a low-sodium diet. He will rto in four to six months for re-evaluation. EKG performed, no new changes noted.   - POCT Urinalysis Dipstick (81002) - POCT UA - Microalbumin - EKG 12-Lead - CMP14+EGFR -  CBC - Lipid panel  3. Chronic renal disease, stage II  Chronic, this has been stable. I will check GFR, Cr today. He is encouraged to stay well hydrated and keep BP well controlled to avoid further progression.   4. Other abnormal glucose  HIS A1C HAS BEEN ELEVATED IN THE PAST. I WILL CHECK AN A1C, BMET TODAY.  HE WAS ENCOURAGED TO AVOID SUGARY BEVERAGES AND PROCESSED FOODS INCLUDNG BREADS, RICE AND PASTA.  - Hemoglobin A1c  5. Ectatic abdominal aorta (HCC)  Chronic, yet stable. Previous abdominal u/s reviewed in full detail.   6. Constipation, unspecified constipation type  He is encouraged to increase his water and fiber intake. He is also encouraged to exercise 30 minutes five days per week. If sx are persistent, he may benefit from magnesium supplementation.   Maximino Greenland, MD    THE PATIENT IS ENCOURAGED TO PRACTICE SOCIAL DISTANCING DUE TO THE COVID-19 PANDEMIC.

## 2019-10-11 ENCOUNTER — Encounter: Payer: Self-pay | Admitting: Emergency Medicine

## 2019-10-11 ENCOUNTER — Other Ambulatory Visit: Payer: Self-pay

## 2019-10-11 ENCOUNTER — Emergency Department: Payer: Medicare Other

## 2019-10-11 ENCOUNTER — Emergency Department
Admission: EM | Admit: 2019-10-11 | Discharge: 2019-10-11 | Disposition: A | Discharge status: Home / Self Care | Payer: Medicare Other | Attending: Emergency Medicine | Admitting: Emergency Medicine

## 2019-10-11 DIAGNOSIS — Z87891 Personal history of nicotine dependence: Secondary | ICD-10-CM | POA: Diagnosis not present

## 2019-10-11 DIAGNOSIS — I10 Essential (primary) hypertension: Secondary | ICD-10-CM | POA: Diagnosis not present

## 2019-10-11 DIAGNOSIS — R42 Dizziness and giddiness: Secondary | ICD-10-CM | POA: Insufficient documentation

## 2019-10-11 LAB — URINALYSIS, COMPLETE (UACMP) WITH MICROSCOPIC
Bacteria, UA: NONE SEEN
Bilirubin Urine: NEGATIVE
Glucose, UA: NEGATIVE mg/dL
Hgb urine dipstick: NEGATIVE
Ketones, ur: NEGATIVE mg/dL
Leukocytes,Ua: NEGATIVE
Nitrite: NEGATIVE
Protein, ur: NEGATIVE mg/dL
Specific Gravity, Urine: 1.005 (ref 1.005–1.030)
WBC, UA: NONE SEEN WBC/hpf (ref 0–5)
pH: 6 (ref 5.0–8.0)

## 2019-10-11 LAB — CBC
HCT: 35.6 % — ABNORMAL LOW (ref 39.0–52.0)
Hemoglobin: 10.9 g/dL — ABNORMAL LOW (ref 13.0–17.0)
MCH: 24.9 pg — ABNORMAL LOW (ref 26.0–34.0)
MCHC: 30.6 g/dL (ref 30.0–36.0)
MCV: 81.5 fL (ref 80.0–100.0)
Platelets: 220 10*3/uL (ref 150–400)
RBC: 4.37 MIL/uL (ref 4.22–5.81)
RDW: 15.2 % (ref 11.5–15.5)
WBC: 3.7 10*3/uL — ABNORMAL LOW (ref 4.0–10.5)
nRBC: 0 % (ref 0.0–0.2)

## 2019-10-11 LAB — BASIC METABOLIC PANEL
Anion gap: 10 (ref 5–15)
BUN: 19 mg/dL (ref 8–23)
CO2: 26 mmol/L (ref 22–32)
Calcium: 8.9 mg/dL (ref 8.9–10.3)
Chloride: 104 mmol/L (ref 98–111)
Creatinine, Ser: 1.16 mg/dL (ref 0.61–1.24)
GFR calc Af Amer: >60 mL/min (ref >60)
GFR calc non Af Amer: >60 mL/min (ref >60)
Glucose, Bld: 120 mg/dL — ABNORMAL HIGH (ref 70–99)
Potassium: 3.8 mmol/L (ref 3.5–5.1)
Sodium: 140 mmol/L (ref 135–145)

## 2019-10-11 LAB — TROPONIN I (HIGH SENSITIVITY)
Troponin I (High Sensitivity): 7 ng/L (ref <18)
Troponin I (High Sensitivity): 8 ng/L (ref <18)

## 2019-10-11 MED ORDER — MECLIZINE HCL 25 MG PO TABS: 25.0000 mg | ORAL_TABLET | Freq: Three times a day (TID) | ORAL | 0 refills | Status: DC | PRN

## 2019-10-11 MED ORDER — SODIUM CHLORIDE 0.9 % IV SOLN
Freq: Once | INTRAVENOUS | Status: AC
  Administered 2019-10-11: 15:00:00 via INTRAVENOUS

## 2019-10-11 NOTE — ED Triage Notes (Signed)
Pt reports he had a sudden onset of dizziness this morning at 0830. Pt reports the dizziness still remains. Pt also reports nausea but denies vomiting. Smile symmetric, grips equal and strong, no weakness present or reports. Pt a& o x 4 with clear speech.

## 2019-10-11 NOTE — ED Notes (Signed)
Pt being transported to MRI at this time.

## 2019-10-11 NOTE — ED Notes (Signed)
Pt ambulated with Wilfred Lacy, NT. NAD noted, O2 Sat maintained in mid- upper 90's.

## 2019-10-11 NOTE — ED Provider Notes (Addendum)
-----------------------------------------   5:20 PM on 10/11/2019 -----------------------------------------  Patient care assumed from Dr. Jimmye Norman.  Patient's labs are largely within normal limits.  Urinalysis has resulted negative.  CT scan of the head shows no acute abnormality.  Patient has been up and ambulating without any issue.  Denies any dizziness whatsoever.  I discussed strict return precautions for return of dizziness or feeling off balance.  Otherwise patient will follow up with his doctor.  After further discussion the patient states he would like to proceed with MRI to evaluate.  Reassuringly MRI has resulted largely normal for his age.  We will discharge home with meclizine and PCP follow-up.    Harvest Dark, MD 10/11/19 2206

## 2019-10-11 NOTE — ED Notes (Signed)
Pt states he started having dizziness around 0930 this morning, denies any other symptoms. Pt appears in NAD at this time

## 2019-10-11 NOTE — ED Notes (Signed)
Pt in a hurry to leave and unwilling to wait for final VS.

## 2019-10-11 NOTE — ED Notes (Signed)
Wife updated by this RN.

## 2019-10-11 NOTE — ED Provider Notes (Signed)
[facility] Emergency Department Provider Note       Time seen: ----------------------------------------- 2:18 PM on 10/11/2019 -----------------------------------------   I have reviewed the triage vital signs and the nursing notes.  HISTORY   Chief Complaint Dizziness    HPI Edward Gallagher is a 78 y.o. male with a history of allergies, hyperlipidemia, hypertension who presents to the ED for sudden onset of dizziness morning at 830.  Patient feels lightheaded, does not think he can walk without falling.  He states the dizziness still remains.  Does not really have room spinning sensation, denies any other neurologic symptoms.  Past Medical History:  Diagnosis Date  . Allergy   . Hyperlipidemia   . Hypertension   . Prostatic hypertrophy   . Sinus trouble   . Sleep apnea     Patient Active Problem List   Diagnosis Date Noted  . 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 OSA (obstructive sleep apnea) 12/25/2012    Past Surgical History:  Procedure Laterality Date  . HERNIA REPAIR    . VASECTOMY      Allergies Patient has no known allergies.  Social History Social History   Tobacco Use  . Smoking status: Former Smoker    Packs/day: 0.50    Years: 35.00    Pack years: 17.50    Types: Cigarettes    Quit date: 12/12/1997    Years since quitting: 21.8  . Smokeless tobacco: Never Used  . Tobacco comment: 12 cigs daily  Substance Use Topics  . Alcohol use: Yes    Comment: ocasional beer  . Drug use: No    Review of Systems Constitutional: Negative for fever. Cardiovascular: Negative for chest pain. Respiratory: Negative for shortness of breath. Gastrointestinal: Negative for abdominal pain, vomiting and diarrhea. Musculoskeletal: Negative for back pain. Skin: Negative for rash. Neurological: Positive for dizziness  All systems negative/normal/unremarkable except as stated in the  HPI  ____________________________________________   PHYSICAL EXAM:  VITAL SIGNS: ED Triage Vitals [10/11/19 1211]  Enc Vitals Group     BP 130/62     Pulse Rate 66     Resp 17     Temp 98.1 F (36.7 C)     Temp Source Oral     SpO2 98 %     Weight 243 lb (110.2 kg)     Height 6\' 2"  (1.88 m)     Head Circumference      Peak Flow      Pain Score 0     Pain Loc      Pain Edu?      Excl. in [place]?     Constitutional: Alert and oriented. Well appearing and in no distress. Eyes: Conjunctivae are normal. Normal extraocular movements. ENT      Head: Normocephalic and atraumatic.      Nose: No congestion/rhinnorhea.      Mouth/Throat: Mucous membranes are moist.      Neck: No stridor. Cardiovascular: Normal rate, regular rhythm. No murmurs, rubs, or gallops. Respiratory: Normal respiratory effort without tachypnea nor retractions. Breath sounds are clear and equal bilaterally. No wheezes/rales/rhonchi. Gastrointestinal: Soft and nontender. Normal bowel sounds Musculoskeletal: Nontender with normal range of motion in extremities. No lower extremity tenderness nor edema. Neurologic:  Normal speech and language. No gross focal neurologic deficits are appreciated.  Strength, sensation, cranial nerves appear to be normal Skin:  Skin is warm, dry and intact. No rash noted. Psychiatric: Mood and affect are normal. Speech  and behavior are normal.  ____________________________________________  EKG: Interpreted by me.  Sinus rhythm with sinus arrhythmia, first-degree AV block, normal axis, normal QT  ____________________________________________  ED COURSE:  As part of my medical decision making, I reviewed the following data within the [place] History obtained from family if available, nursing notes, old chart and ekg, as well as notes from prior ED visits. Patient presented for dizziness, we will assess with labs and imaging as indicated at this time.    Procedures  Edward Gallagher was evaluated in Emergency Department on 10/11/2019 for the symptoms described in the history of present illness. He was evaluated in the context of the global COVID-19 pandemic, which necessitated consideration that the patient might be at risk for infection with the SARS-CoV-2 virus that causes COVID-19. Institutional protocols and algorithms that pertain to the evaluation of patients at risk for COVID-19 are in a state of rapid change based on information released by regulatory bodies including the CDC and federal and state organizations. These policies and algorithms were followed during the patient's care in the ED.  ____________________________________________   LABS (pertinent positives/negatives)  Labs Reviewed  BASIC METABOLIC PANEL - Abnormal; Notable for the following components:      Result Value   Glucose, Bld 120 (*)    All other components within normal limits  CBC - Abnormal; Notable for the following components:   WBC 3.7 (*)    Hemoglobin 10.9 (*)    HCT 35.6 (*)    MCH 24.9 (*)    All other components within normal limits  URINALYSIS, COMPLETE (UACMP) WITH MICROSCOPIC  TROPONIN I (HIGH SENSITIVITY)  TROPONIN I (HIGH SENSITIVITY)    RADIOLOGY CT head  IMPRESSION: No acute intracranial findings or mass lesions.  ____________________________________________   DIFFERENTIAL DIAGNOSIS   Dehydration, orthostatic hypotension, electrolyte abnormality, occult infection, CVA, MI, arrhythmia, vertigo  FINAL ASSESSMENT AND PLAN  Dizziness   Plan: The patient had presented for persistent dizziness. Patient's labs have been within normal limits for him. Patient's imaging initially was negative including CT imaging of the brain.  Final disposition is pending at this time.   Laurence Aly, MD    Note: This note was generated in part or whole with voice recognition software. Voice recognition is usually quite accurate but there are  transcription errors that can and very often do occur. I apologize for any typographical errors that were not detected and corrected.     Earleen Newport, MD 10/11/19 [phone]

## 2019-10-15 ENCOUNTER — Telehealth: Payer: Self-pay

## 2019-10-15 NOTE — Telephone Encounter (Signed)
I left a message for the Edward Gallagher to call the office back because Dr. Baird Cancer wanted to check and see how he is feeling because the Edward Gallagher had a recent ER visit for dizziness.

## 2019-10-17 ENCOUNTER — Other Ambulatory Visit: Payer: Self-pay

## 2019-10-17 ENCOUNTER — Ambulatory Visit (INDEPENDENT_AMBULATORY_CARE_PROVIDER_SITE_OTHER): Payer: Medicare Other | Admitting: Internal Medicine

## 2019-10-17 ENCOUNTER — Encounter: Payer: Self-pay | Admitting: Internal Medicine

## 2019-10-17 VITALS — BP 130/74 | HR 78 | Temp 98.7°F | Ht 74.0 in | Wt 248.0 lb

## 2019-10-17 DIAGNOSIS — Z87898 Personal history of other specified conditions: Secondary | ICD-10-CM | POA: Diagnosis not present

## 2019-10-17 DIAGNOSIS — Z09 Encounter for follow-up examination after completed treatment for conditions other than malignant neoplasm: Secondary | ICD-10-CM

## 2019-10-17 DIAGNOSIS — I498 Other specified cardiac arrhythmias: Secondary | ICD-10-CM | POA: Diagnosis not present

## 2019-10-17 DIAGNOSIS — D649 Anemia, unspecified: Secondary | ICD-10-CM | POA: Diagnosis not present

## 2019-10-17 NOTE — Patient Instructions (Signed)
Your EKG from the ER showed irregular heart beat, so since you are not aware of having this before, I am sending you to see Dr Marda Stalker. Ones the authorization in in place, they will call you to make the appointment.

## 2019-10-17 NOTE — Progress Notes (Signed)
Subjective:     Patient ID: Edward Gallagher , male    DOB: [birth year] , 78 y.o.   MRN: [id number]   Chief Complaint  Patient presents with  . Hospitalization Follow-up    HPI  Pt is here for hospital FU due to dizziness. Was seen in ER 10/30. Was given rx for this but has not filled it and his dizziness has resolved. He states his dizziness started when he got up from eating breakfast and was not spinning sensation. He had a friend take him to ER. Has labs and neg head CT. While there for 10 hours, he had an IV on, but the time he left the dizziness was completely gone.  Used to see cardiologist in [place] for edema, but is not aware of any cardiac conditions.  Past Medical History:  Diagnosis Date  . Allergy   . Hyperlipidemia   . Hypertension   . Prostatic hypertrophy   . Sinus trouble   . Sleep apnea      Family History  Problem Relation Age of Onset  . Hypertension Other   . Prostate cancer Father   . Testicular cancer Brother   . Prostate cancer Brother   . Lung cancer Brother   . Cancer Brother        double mynoma  . Healthy Mother      Current Outpatient Medications:  .  aspirin 81 MG tablet, Take 81 mg by mouth daily., Disp: , Rfl:  .  atorvastatin (LIPITOR) 20 MG tablet, TAKE 1 TABLET BY MOUTH  DAILY, Disp: 90 tablet, Rfl: 3 .  finasteride (PROSCAR) 5 MG tablet, Take 5 mg by mouth daily., Disp: , Rfl:  .  hydrochlorothiazide (MICROZIDE) 12.5 MG capsule, TAKE 1 CAPSULE BY MOUTH  DAILY, Disp: 90 capsule, Rfl: 3 .  Krill Oil 1000 MG CAPS, Take 1,000 mg by mouth daily., Disp: , Rfl:  .  meclizine (ANTIVERT) 25 MG tablet, Take 1 tablet (25 mg total) by mouth 3 (three) times daily as needed for dizziness., Disp: 30 tablet, Rfl: 0 .  Multiple Vitamin (MULTIVITAMIN) tablet, Take 1 tablet by mouth daily., Disp: , Rfl:  .  terazosin (HYTRIN) 5 MG capsule, TAKE 1 CAPSULE BY MOUTH  DAILY AT BEDTIME, Disp: 90 capsule, Rfl: 3   No Known Allergies   Review of Systems   Denies fatigue, change in bowel habits, black stools or blood in stools, chest pains, SOB, edema, chest pressure, HA, dizziness.  Today's Vitals   10/17/19 0954  BP: 130/74  Pulse: 78  Temp: 98.7 F (37.1 C)  TempSrc: Oral  Weight: 248 lb (112.5 kg)  Height: 6\' 2"  (1.88 m)   Body mass index is 31.84 kg/m.   Objective:  Physical Exam   Constitutional: he is oriented to person, place, and time. he appears well-developed and well-nourished. No distress.  HENT:  Head: Normocephalic and atraumatic.  Right Ear: External ear normal.  Left Ear: External ear normal.  Nose: Nose normal.  Eyes: Conjunctivae are normal. Right eye exhibits no discharge. Left eye exhibits no discharge. No scleral icterus.  Neck: Neck supple. No thyromegaly present.  No carotid bruits bilaterally  Cardiovascular: Normal rate and RIR. EKG from ER on 10/30 showed sinus arrhythmia  No murmur heard. Pulmonary/Chest: Effort normal and breath sounds normal. No respiratory distress.  Musculoskeletal: Normal range of motion. he exhibits no edema.  Lymphadenopathy: he has no cervical adenopathy.  Neurological: he is alert and oriented to person, place, and  time.  Skin: Skin is warm and dry. Capillary refill takes less than 2 seconds. No rash noted. he is not diaphoretic.  Psychiatric: he has a normal mood and affect. His behavior is normal. Judgment and thought content normal.  Nursing note reviewed.  Assessment And Plan:   1. Anemia, unspecified type- new. I reviewed his prior CBC from the past and they have been normal.  - CBC no Diff - Iron and IBC [id number]) - Ferritin   We will inform him of his results when back.  2. Sinus arrhythmia- new finding per pt - Ambulatory referral to Cardiology  I explained to him perhaps his dizziness was a streak of arrhythmia like Afib or flutter or was just dehydration since it resolved that day, but would be a good idea to consult with cardiology with current EKG  findings since pt was not aware of irregular heart beat. He agreed.  I reviewed his ER notes, labs and CT results.   Floris Neuhaus RODRIGUEZ-SOUTHWORTH, PA-C    THE PATIENT IS ENCOURAGED TO PRACTICE SOCIAL DISTANCING DUE TO THE COVID-19 PANDEMIC.

## 2019-10-18 ENCOUNTER — Other Ambulatory Visit: Payer: Self-pay | Admitting: Internal Medicine

## 2019-10-18 DIAGNOSIS — H524 Presbyopia: Secondary | ICD-10-CM | POA: Diagnosis not present

## 2019-10-18 DIAGNOSIS — H2513 Age-related nuclear cataract, bilateral: Secondary | ICD-10-CM | POA: Diagnosis not present

## 2019-10-18 LAB — IRON AND TIBC
Iron Saturation: 13 % — ABNORMAL LOW (ref 15–55)
Iron: 39 ug/dL (ref 38–169)
Total Iron Binding Capacity: 304 ug/dL (ref 250–450)
UIBC: 265 ug/dL (ref 111–343)

## 2019-10-18 LAB — CBC
Hematocrit: 33.7 % — ABNORMAL LOW (ref 37.5–51.0)
Hemoglobin: 10.7 g/dL — ABNORMAL LOW (ref 13.0–17.7)
MCH: 25.5 pg — ABNORMAL LOW (ref 26.6–33.0)
MCHC: 31.8 g/dL (ref 31.5–35.7)
MCV: 80 fL (ref 79–97)
Platelets: 228 10*3/uL (ref 150–450)
RBC: 4.2 x10E6/uL (ref 4.14–5.80)
RDW: 14.9 % (ref 11.6–15.4)
WBC: 2.8 10*3/uL — ABNORMAL LOW (ref 3.4–10.8)

## 2019-10-18 LAB — FERRITIN: Ferritin: 20 ng/mL — ABNORMAL LOW (ref 30–400)

## 2019-10-18 NOTE — Progress Notes (Unsigned)
Please make sure he has read his mychart message

## 2019-10-21 ENCOUNTER — Other Ambulatory Visit: Payer: Self-pay

## 2019-10-21 NOTE — Telephone Encounter (Signed)
The pt left a message that he wanted to know if he could play golf tomorrow before he sees Dr. Mingo Amber the cardiologist next Friday.  The pt was notified that he should hold off on any exercise until he sees the cardiologist.

## 2019-10-30 DIAGNOSIS — G4733 Obstructive sleep apnea (adult) (pediatric): Secondary | ICD-10-CM | POA: Diagnosis not present

## 2019-11-01 ENCOUNTER — Other Ambulatory Visit: Payer: Self-pay

## 2019-11-01 ENCOUNTER — Ambulatory Visit: Payer: Medicare Other | Admitting: Cardiology

## 2019-11-01 ENCOUNTER — Encounter: Payer: Self-pay | Admitting: Cardiology

## 2019-11-01 ENCOUNTER — Ambulatory Visit: Payer: Medicare Other

## 2019-11-01 VITALS — BP 139/81 | HR 70 | Temp 98.3°F | Ht 74.0 in | Wt 246.0 lb

## 2019-11-01 DIAGNOSIS — I493 Ventricular premature depolarization: Secondary | ICD-10-CM

## 2019-11-01 DIAGNOSIS — I498 Other specified cardiac arrhythmias: Secondary | ICD-10-CM

## 2019-11-01 DIAGNOSIS — R42 Dizziness and giddiness: Secondary | ICD-10-CM

## 2019-11-01 NOTE — Progress Notes (Signed)
Patient referred by Glendale Chard, MD for lightheadedness  Subjective:   Edward Gallagher, male    DOB: 01/21/1941, 78 y.o.   MRN: [id number]   Chief Complaint  Patient presents with  . sinus arrhythima  . New Patient (Initial Visit)    HPI  78 y.o. African American male with hypertension, hyperlipidemia, OSA, referred for evaluation of lightheadedness.  Patient is retired, stays active with regular walking and golf. Three weeks ago, he had an episode of sudden onset lighteadedness when getting up from sitting position, lasting for a few seconds. He was taken to [facility], and had a negative stroke workup with normal brain MRI. He has been frustrated since then, as his activity has been limited, largely due to fear of recurrence of symptoms. His EKG then showed sinus arrhtymia, shows 2 pVC's today. He denies chest pain, shortness of breath, palpitations, leg edema, orthopnea, PND, TIA/syncope.  Past Medical History:  Diagnosis Date  . Allergy   . Hyperlipidemia   . Hypertension   . Prostatic hypertrophy   . Sinus trouble   . Sleep apnea      Past Surgical History:  Procedure Laterality Date  . HERNIA REPAIR    . VASECTOMY       Social History   Socioeconomic History  . Marital status: Single    Spouse name: Not on file  . Number of children: 0  . Years of education: Not on file  . Highest education level: Not on file  Occupational History  . Occupation: retired  Scientific laboratory technician  . Financial resource strain: Not hard at all  . Food insecurity    Worry: Never true    Inability: Never true  . Transportation needs    Medical: No    Non-medical: No  Tobacco Use  . Smoking status: Former Smoker    Packs/day: 0.50    Years: 35.00    Pack years: 17.50    Types: Cigarettes    Quit date: 12/12/1997    Years since quitting: 21.9  . Smokeless tobacco: Never Used  . Tobacco comment: 12 cigs daily  Substance and Sexual Activity  . Alcohol use: Yes    Comment: ocasional beer  . Drug use: No  . Sexual activity: Not Currently  Lifestyle  . Physical activity    Days per week: 7 days    Minutes per session: 60 min  . Stress: Not at all  Relationships  . Social Herbalist on phone: Not on file    Gets together: Not on file    Attends religious service: Not on file    Active member of club or organization: Not on file    Attends meetings of clubs or organizations: Not on file    Relationship status: Not on file  . Intimate partner violence    Fear of current or ex partner: No    Emotionally abused: No    Physically abused: No    Forced sexual activity: No  Other Topics Concern  . Not on file  Social History Narrative  . Not on file     Family History  Problem Relation Age of Onset  . Hypertension Other   . Prostate cancer Father   . Testicular cancer Brother   . Prostate cancer Brother   . Lung cancer Brother   . Cancer Brother        double mynoma  . Healthy Mother      Current  Outpatient Medications on File Prior to Visit  Medication Sig Dispense Refill  . aspirin 81 MG tablet Take 81 mg by mouth daily.    Marland Kitchen atorvastatin (LIPITOR) 20 MG tablet TAKE 1 TABLET BY MOUTH  DAILY 90 tablet 3  . finasteride (PROSCAR) 5 MG tablet Take 5 mg by mouth daily.    . hydrochlorothiazide (MICROZIDE) 12.5 MG capsule TAKE 1 CAPSULE BY MOUTH  DAILY 90 capsule 3  . Krill Oil 1000 MG CAPS Take 1,000 mg by mouth daily.    . meclizine (ANTIVERT) 25 MG tablet Take 1 tablet (25 mg total) by mouth 3 (three) times daily as needed for dizziness. 30 tablet 0  . Multiple Vitamin (MULTIVITAMIN) tablet Take 1 tablet by mouth daily.    Marland Kitchen terazosin (HYTRIN) 5 MG capsule TAKE 1 CAPSULE BY MOUTH  DAILY AT BEDTIME 90 capsule 3   No current facility-administered medications on file prior to visit.     Cardiovascular studies:  EKG 11/01/2019: Sinus rhythm 73 bpm. Two PVC's seen.  Outside EKG 10/11/2019: Sinus arrhythmia with  first-degree AV block.   Recent labs: Results for Edward Gallagher (MRN [id number]) as of 11/01/2019 12:30  Ref. Range 10/11/2019 12:19  Sodium Latest Ref Range: 135 - 145 mmol/L 140  Potassium Latest Ref Range: 3.5 - 5.1 mmol/L 3.8  Chloride Latest Ref Range: 98 - 111 mmol/L 104  CO2 Latest Ref Range: 22 - 32 mmol/L 26  Glucose Latest Ref Range: 70 - 99 mg/dL 120 (H)  BUN Latest Ref Range: 8 - 23 mg/dL 19  Creatinine Latest Ref Range: 0.61 - 1.24 mg/dL 1.16  Calcium Latest Ref Range: 8.9 - 10.3 mg/dL 8.9  Anion gap Latest Ref Range: 5 - 15  10   Results for Edward Gallagher (MRN [id number]) as of 11/01/2019 12:30  Ref. Range 10/17/2019 10:21  WBC Latest Ref Range: 3.4 - 10.8 x10E3/uL 2.8 (L)  RBC Latest Ref Range: 4.14 - 5.80 x10E6/uL 4.20  Hemoglobin Latest Ref Range: 13.0 - 17.7 g/dL 10.7 (L)  HCT Latest Ref Range: 37.5 - 51.0 % 33.7 (L)  MCV Latest Ref Range: 79 - 97 fL 80  MCH Latest Ref Range: 26.6 - 33.0 pg 25.5 (L)  MCHC Latest Ref Range: 31.5 - 35.7 g/dL 31.8  RDW Latest Ref Range: 11.6 - 15.4 % 14.9  Platelets Latest Ref Range: 150 - 450 x10E3/uL 228   Results for Edward Gallagher (MRN [id number]) as of 11/01/2019 12:30  Ref. Range 10/17/2019 10:21  Iron Latest Ref Range: 38 - 169 ug/dL 39  UIBC Latest Ref Range: 111 - 343 ug/dL 265  TIBC Latest Ref Range: 250 - 450 ug/dL 304  Ferritin Latest Ref Range: 30 - 400 ng/mL 20 (L)  Iron Saturation Latest Ref Range: 15 - 55 % 13 (L)    Results for Edward Gallagher (MRN [id number]) as of 11/01/2019 12:30  Ref. Range 10/11/2019 12:19 10/11/2019 14:51  Troponin I (High Sensitivity) Latest Ref Range: <18 ng/L 7 8   Results for Edward Gallagher (MRN [id number]) as of 11/01/2019 12:30  Ref. Range 09/25/2019 10:00  Total CHOL/HDL Ratio Latest Ref Range: 0.0 - 5.0 ratio 3.0  Cholesterol, Total Latest Ref Range: 100 - 199 mg/dL 157  HDL Cholesterol Latest Ref Range: >39 mg/dL 52  Triglycerides Latest Ref Range: 0 - 149 mg/dL 75  VLDL  Cholesterol Cal Latest Ref Range: 5 - 40 mg/dL 14  LDL Chol Calc (NIH) Latest Ref Range: 0 - 99 mg/dL  91     Review of Systems  Constitution: Negative for decreased appetite, malaise/fatigue, weight gain and weight loss.  HENT: Negative for congestion.   Eyes: Negative for visual disturbance.  Cardiovascular: Negative for chest pain, dyspnea on exertion, leg swelling, palpitations and syncope.  Respiratory: Negative for cough.   Endocrine: Negative for cold intolerance.  Hematologic/Lymphatic: Does not bruise/bleed easily.  Skin: Negative for itching and rash.  Musculoskeletal: Negative for myalgias.  Gastrointestinal: Negative for abdominal pain, nausea and vomiting.  Genitourinary: Negative for dysuria.  Neurological: Positive for light-headedness (One episode). Negative for dizziness and weakness.  Psychiatric/Behavioral: The patient is not nervous/anxious.   All other systems reviewed and are negative.        Vitals:   11/01/19 1428 11/01/19 1435  BP: 139/76 139/81  Pulse: 74 70  Temp: 98.3 F (36.8 C)   SpO2: 97%      Body mass index is 31.58 kg/m. Filed Weights   11/01/19 1428  Weight: 246 lb (111.6 kg)     Objective:   Physical Exam  Constitutional: He is oriented to person, place, and time. He appears well-developed and well-nourished. No distress.  HENT:  Head: Normocephalic and atraumatic.  Eyes: Pupils are equal, round, and reactive to light. Conjunctivae are normal.  Neck: No JVD present.  Cardiovascular: Normal rate, regular rhythm and intact distal pulses.  No murmur heard. Pulmonary/Chest: Effort normal and breath sounds normal. He has no wheezes. He has no rales.  Abdominal: Soft. Bowel sounds are normal. There is no rebound.  Musculoskeletal:        General: No edema.  Lymphadenopathy:    He has no cervical adenopathy.  Neurological: He is alert and oriented to person, place, and time. No cranial nerve deficit.  Skin: Skin is warm and dry.   Psychiatric: He has a normal mood and affect.  Nursing note and vitals reviewed.         Assessment & Recommendations:   78 y.o. African American male with hypertension, hyperlipidemia, OSA, referred for evaluation of lightheadedness.  Lightheadedness: Likely an one off episode-vasovagal or dehydration. Will obtain echocardiogram, carotid duplex US, and event monitor. Encourage liberal hydration.  Thank you for referring the patient to [place]. Please feel free to contact with any questions.  Nigel Mormon, MD [facility] Pager: [phone] Office: [phone]

## 2019-11-02 ENCOUNTER — Encounter: Payer: Self-pay | Admitting: Cardiology

## 2019-11-02 DIAGNOSIS — R42 Dizziness and giddiness: Secondary | ICD-10-CM | POA: Insufficient documentation

## 2019-11-02 DIAGNOSIS — I498 Other specified cardiac arrhythmias: Secondary | ICD-10-CM | POA: Insufficient documentation

## 2019-11-02 DIAGNOSIS — I493 Ventricular premature depolarization: Secondary | ICD-10-CM | POA: Insufficient documentation

## 2019-11-09 DIAGNOSIS — B9689 Other specified bacterial agents as the cause of diseases classified elsewhere: Secondary | ICD-10-CM | POA: Diagnosis not present

## 2019-11-09 DIAGNOSIS — J019 Acute sinusitis, unspecified: Secondary | ICD-10-CM | POA: Diagnosis not present

## 2019-11-09 DIAGNOSIS — J209 Acute bronchitis, unspecified: Secondary | ICD-10-CM | POA: Diagnosis not present

## 2019-11-14 ENCOUNTER — Other Ambulatory Visit: Payer: Medicare Other

## 2019-11-15 ENCOUNTER — Ambulatory Visit (INDEPENDENT_AMBULATORY_CARE_PROVIDER_SITE_OTHER): Payer: Medicare Other

## 2019-11-15 ENCOUNTER — Other Ambulatory Visit: Payer: Self-pay

## 2019-11-15 DIAGNOSIS — I498 Other specified cardiac arrhythmias: Secondary | ICD-10-CM | POA: Diagnosis not present

## 2019-11-15 DIAGNOSIS — I341 Nonrheumatic mitral (valve) prolapse: Secondary | ICD-10-CM | POA: Diagnosis not present

## 2019-11-15 DIAGNOSIS — R42 Dizziness and giddiness: Secondary | ICD-10-CM

## 2019-11-17 NOTE — Progress Notes (Signed)
Minimal narrowing in neck arteries.  Thanks MJP

## 2019-11-17 NOTE — Progress Notes (Signed)
No abnormality seen on echocardiogram, to explain dizziness symptoms. Mild to moderate leakiness of two valves, only needs monitoring at this time. Will discuss more at the upcoming visit.   Thanks MJP

## 2019-11-18 ENCOUNTER — Encounter: Payer: Self-pay | Admitting: Internal Medicine

## 2019-11-18 NOTE — Progress Notes (Signed)
Called and spoke with patient regarding results. CM, CMA

## 2019-11-18 NOTE — Progress Notes (Signed)
Patient is aware 

## 2019-11-29 DIAGNOSIS — G4733 Obstructive sleep apnea (adult) (pediatric): Secondary | ICD-10-CM | POA: Diagnosis not present

## 2019-12-16 ENCOUNTER — Telehealth: Payer: Self-pay

## 2019-12-16 ENCOUNTER — Emergency Department
Admission: EM | Admit: 2019-12-16 | Discharge: 2019-12-16 | Disposition: A | Discharge status: Home / Self Care | Payer: Medicare Other | Attending: Emergency Medicine | Admitting: Emergency Medicine

## 2019-12-16 ENCOUNTER — Other Ambulatory Visit: Payer: Self-pay

## 2019-12-16 ENCOUNTER — Encounter: Payer: Self-pay | Admitting: Emergency Medicine

## 2019-12-16 ENCOUNTER — Emergency Department: Payer: Medicare Other

## 2019-12-16 DIAGNOSIS — Z87891 Personal history of nicotine dependence: Secondary | ICD-10-CM | POA: Diagnosis not present

## 2019-12-16 DIAGNOSIS — I4891 Unspecified atrial fibrillation: Secondary | ICD-10-CM | POA: Insufficient documentation

## 2019-12-16 DIAGNOSIS — Z79899 Other long term (current) drug therapy: Secondary | ICD-10-CM | POA: Diagnosis not present

## 2019-12-16 DIAGNOSIS — R079 Chest pain, unspecified: Secondary | ICD-10-CM | POA: Diagnosis not present

## 2019-12-16 DIAGNOSIS — Z7982 Long term (current) use of aspirin: Secondary | ICD-10-CM | POA: Insufficient documentation

## 2019-12-16 DIAGNOSIS — R0789 Other chest pain: Secondary | ICD-10-CM | POA: Diagnosis not present

## 2019-12-16 DIAGNOSIS — I10 Essential (primary) hypertension: Secondary | ICD-10-CM | POA: Insufficient documentation

## 2019-12-16 DIAGNOSIS — Z743 Need for continuous supervision: Secondary | ICD-10-CM | POA: Diagnosis not present

## 2019-12-16 HISTORY — DX: Unspecified atrial fibrillation: I48.91

## 2019-12-16 LAB — CBC
HCT: 35.9 % — ABNORMAL LOW (ref 39.0–52.0)
Hemoglobin: 11.5 g/dL — ABNORMAL LOW (ref 13.0–17.0)
MCH: 24.9 pg — ABNORMAL LOW (ref 26.0–34.0)
MCHC: 32 g/dL (ref 30.0–36.0)
MCV: 77.9 fL — ABNORMAL LOW (ref 80.0–100.0)
Platelets: 214 10*3/uL (ref 150–400)
RBC: 4.61 MIL/uL (ref 4.22–5.81)
RDW: 15.6 % — ABNORMAL HIGH (ref 11.5–15.5)
WBC: 3.2 10*3/uL — ABNORMAL LOW (ref 4.0–10.5)
nRBC: 0 % (ref 0.0–0.2)

## 2019-12-16 LAB — BASIC METABOLIC PANEL
Anion gap: 9 (ref 5–15)
BUN: 16 mg/dL (ref 8–23)
CO2: 29 mmol/L (ref 22–32)
Calcium: 9.1 mg/dL (ref 8.9–10.3)
Chloride: 100 mmol/L (ref 98–111)
Creatinine, Ser: 1.2 mg/dL (ref 0.61–1.24)
GFR calc Af Amer: >60 mL/min (ref >60)
GFR calc non Af Amer: 58 mL/min — ABNORMAL LOW (ref >60)
Glucose, Bld: 106 mg/dL — ABNORMAL HIGH (ref 70–99)
Potassium: 3.8 mmol/L (ref 3.5–5.1)
Sodium: 138 mmol/L (ref 135–145)

## 2019-12-16 LAB — TROPONIN I (HIGH SENSITIVITY)
Troponin I (High Sensitivity): 8 ng/L (ref <18)
Troponin I (High Sensitivity): 6 ng/L (ref <18)

## 2019-12-16 MED ORDER — NITROGLYCERIN 0.4 MG SL SUBL: 0.4000 mg | SUBLINGUAL_TABLET | SUBLINGUAL | 3 refills | Status: DC | PRN

## 2019-12-16 MED ORDER — NITROGLYCERIN 0.4 MG SL SUBL
0.4000 mg | SUBLINGUAL_TABLET | Freq: Once | SUBLINGUAL | Status: AC
  Administered 2019-12-16: 0.4 mg via SUBLINGUAL
  Filled 2019-12-16: qty 1

## 2019-12-16 NOTE — ED Notes (Signed)
Rainbow sent to lab

## 2019-12-16 NOTE — Telephone Encounter (Signed)
What is he admitted with?

## 2019-12-16 NOTE — Telephone Encounter (Signed)
I do not see any acute abnormalities on EKG and bloodwork so far. I do not think he will need admission. He has a f/u w/me on 1/6, which he can keep if he is discharged from the ED.   Thanks MJP

## 2019-12-16 NOTE — Telephone Encounter (Signed)
Telephone encounter:  Reason for call:Patient's wife called and said husband is at [facility] currently and wanted to know how she would go about transferring him to [facility] if they are going to keep him.   Usual provider: MP  Last office visit: 11/01/19  Next office visit: 12/18/19 VV   Last hospitalization: current ED admission   Current Facility-Administered Medications on File Prior to Visit  Medication Dose Route Frequency Provider Last Rate Last Admin  . nitroGLYCERIN (NITROSTAT) SL tablet 0.4 mg  0.4 mg Sublingual Once Lavonia Drafts, MD       Current Outpatient Medications on File Prior to Visit  Medication Sig Dispense Refill  . aspirin 81 MG tablet Take 81 mg by mouth daily.    Marland Kitchen atorvastatin (LIPITOR) 20 MG tablet TAKE 1 TABLET BY MOUTH  DAILY (Patient taking differently: 20 mg. ) 90 tablet 3  . finasteride (PROSCAR) 5 MG tablet Take 5 mg by mouth daily.    . fluticasone (FLONASE) 50 MCG/ACT nasal spray Place 1 spray into both nostrils daily.    . hydrochlorothiazide (MICROZIDE) 12.5 MG capsule TAKE 1 CAPSULE BY MOUTH  DAILY 90 capsule 3  . Krill Oil 1000 MG CAPS Take 1,000 mg by mouth daily.    . meclizine (ANTIVERT) 25 MG tablet Take 1 tablet (25 mg total) by mouth 3 (three) times daily as needed for dizziness. 30 tablet 0  . Multiple Vitamin (MULTIVITAMIN) tablet Take 1 tablet by mouth daily.    Marland Kitchen terazosin (HYTRIN) 5 MG capsule TAKE 1 CAPSULE BY MOUTH  DAILY AT BEDTIME 90 capsule 3

## 2019-12-16 NOTE — ED Triage Notes (Signed)
Pt reports pressure like CP to his left chest that started approx 8am this am. Pt denies pain that radiates, SOB, Nausea or other symptoms.

## 2019-12-16 NOTE — ED Provider Notes (Signed)
[facility] Emergency Department Provider Note   ____________________________________________    I have reviewed the triage vital signs and the nursing notes.   HISTORY  Chief Complaint Chest Pain     HPI Edward Gallagher is a 79 y.o. male who presents with complaints of chest pain.  Patient reports he woke up this morning and felt a mild pressure in the left side of his chest.  He took some aspirin and it seemed to help his symptoms.  He denies fevers or chills.  No shortness of breath.  No pleurisy.  No nausea vomiting or diaphoresis.  Is never had this before.  Is being worked up by his cardiologist for recent diagnosis of atrial fibrillation.  Is not on anticoagulation  Past Medical History:  Diagnosis Date  . A-fib ([place])   . Allergy   . Hyperlipidemia   . Hypertension   . Prostatic hypertrophy   . Sinus trouble   . Sleep apnea     Patient Active Problem List   Diagnosis Date Noted  . Sinus arrhythmia 11/02/2019  . Dizziness 11/02/2019  . PVC (premature ventricular contraction) 11/02/2019  . 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 OSA (obstructive sleep apnea) 12/25/2012    Past Surgical History:  Procedure Laterality Date  . HERNIA REPAIR    . VASECTOMY      Prior to Admission medications   Medication Sig Start Date End Date Taking? Authorizing Provider  aspirin 81 MG tablet Take 81 mg by mouth daily.    [provider]  atorvastatin (LIPITOR) 20 MG tablet TAKE 1 TABLET BY MOUTH  DAILY Patient taking differently: 20 mg.  09/06/19   Glendale Chard, MD  finasteride (PROSCAR) 5 MG tablet Take 5 mg by mouth daily.    [provider]  fluticasone (FLONASE) 50 MCG/ACT nasal spray Place 1 spray into both nostrils daily.    [provider]  hydrochlorothiazide (MICROZIDE) 12.5 MG capsule TAKE 1 CAPSULE BY MOUTH  DAILY 09/06/19   Glendale Chard, MD  Javier Docker Oil 1000 MG CAPS Take 1,000 mg by  mouth daily.    [provider]  meclizine (ANTIVERT) 25 MG tablet Take 1 tablet (25 mg total) by mouth 3 (three) times daily as needed for dizziness. 10/11/19   Harvest Dark, MD  Multiple Vitamin (MULTIVITAMIN) tablet Take 1 tablet by mouth daily.    [provider]  nitroGLYCERIN (NITROSTAT) 0.4 MG SL tablet Place 1 tablet (0.4 mg total) under the tongue every 5 (five) minutes as needed for chest pain. 12/16/19 12/15/20  Lavonia Drafts, MD  terazosin (HYTRIN) 5 MG capsule TAKE 1 CAPSULE BY MOUTH  DAILY AT BEDTIME 09/06/19   Glendale Chard, MD     Allergies Patient has no known allergies.  Family History  Problem Relation Age of Onset  . Hypertension Other   . Prostate cancer Father   . Testicular cancer Brother   . Prostate cancer Brother   . Lung cancer Brother   . Cancer Brother        double mynoma  . Healthy Mother     Social History Social History   Tobacco Use  . Smoking status: Former Smoker    Packs/day: 0.50    Years: 35.00    Pack years: 17.50    Types: Cigarettes    Quit date: 12/12/1997    Years since quitting: 22.0  . Smokeless tobacco: Never Used  . Tobacco comment: 12 cigs daily  Substance Use Topics  . Alcohol use: Yes    Comment: ocasional beer  . Drug use: No    Review of Systems  Constitutional: No fever/chills Eyes: No visual changes.  ENT: No sore throat. Cardiovascular: As above. Respiratory: Denies shortness of breath. Gastrointestinal: No abdominal pain.   Genitourinary: Negative for dysuria. Musculoskeletal: Negative for back pain. Skin: Negative for rash. Neurological: Negative for headache   ____________________________________________   PHYSICAL EXAM:  VITAL SIGNS: ED Triage Vitals  Enc Vitals Group     BP 12/16/19 1300 (!) 152/86     Pulse Rate 12/16/19 0926 65     Resp 12/16/19 0926 20     Temp 12/16/19 0926 99.1 F (37.3 C)     Temp Source 12/16/19 0926 Oral     SpO2 12/16/19 0926 97 %     Weight  12/16/19 0927 109.3 kg (241 lb)     Height 12/16/19 0927 1.905 m (6\' 3" )     Head Circumference --      Peak Flow --      Pain Score 12/16/19 0927 3     Pain Loc --      Pain Edu? --      Excl. in [place]? --     Constitutional: Alert and oriented. No acute distress.  Nose: No congestion/rhinnorhea. Mouth/Throat: Mucous membranes are moist.    Cardiovascular: Normal rate, regular rhythm Good peripheral circulation. Respiratory: Normal respiratory effort.  No retractions. Gastrointestinal: Soft and nontender. No distention.   Musculoskeletal: No lower extremity tenderness nor edema.  Warm and well perfused Neurologic:  Normal speech and language. No gross focal neurologic deficits are appreciated.  Skin:  Skin is warm, dry and intact. No rash noted. Psychiatric: Mood and affect are normal. Speech and behavior are normal.  ____________________________________________   LABS (all labs ordered are listed, but only abnormal results are displayed)  Labs Reviewed  BASIC METABOLIC PANEL - Abnormal; Notable for the following components:      Result Value   Glucose, Bld 106 (*)    GFR calc non Af Amer 58 (*)    All other components within normal limits  CBC - Abnormal; Notable for the following components:   WBC 3.2 (*)    Hemoglobin 11.5 (*)    HCT 35.9 (*)    MCV 77.9 (*)    MCH 24.9 (*)    RDW 15.6 (*)    All other components within normal limits  TROPONIN I (HIGH SENSITIVITY)  TROPONIN I (HIGH SENSITIVITY)   ____________________________________________  EKG  ED ECG REPORT I, Lavonia Drafts, the attending physician, personally viewed and interpreted this ECG.  Date: 12/16/2019  Rhythm: normal sinus rhythm QRS Axis: normal Intervals: normal ST/T Wave abnormalities: normal Narrative Interpretation: no evidence of acute ischemia  ____________________________________________  RADIOLOGY  Chest x-ray normal ____________________________________________   PROCEDURES   Procedure(s) performed: No  Procedures   Critical Care performed: No ____________________________________________   INITIAL IMPRESSION / ASSESSMENT AND PLAN / ED COURSE  Pertinent labs & imaging results that were available during my care of the patient were reviewed by me and considered in my medical decision making (see chart for details).  Patient well-appearing and in no acute distress, EKG is quite reassuring.  Initial troponin is 8, doubt ACS but will repeat troponin and trial nitroglycerin.  Repeat troponin is reassuring, patient had complete resolution of pain after nitroglycerin raising the possibility of angina.  He has cardiology follow-up in 2 days, given that he is  pain-free now with reassuring EKG he feels quite comfortable being discharged with outpatient follow-up with cardiology.  He knows to return if any development of chest pain.  Will Rx nitroglycerin as needed    ____________________________________________   FINAL CLINICAL IMPRESSION(S) / ED DIAGNOSES  Final diagnoses:  Nonspecific chest pain        Note:  This document was prepared using Dragon voice recognition software and may include unintentional dictation errors.   Lavonia Drafts, MD 12/16/19 [phone]

## 2019-12-17 NOTE — Progress Notes (Signed)
Follow up visit  Subjective:   Edward Gallagher, male    DOB: 1941-04-04, 79 y.o.   MRN: [id number]      HPI  79 y.o. African American male with hypertension, hyperlipidemia, OSA, referred for evaluation of lightheadedness.  Echocardiogram showed structurally normal heart, moderate mitral regurgitation. Carotid US showed minimal b/l carotid plaque. Event monitor showed 7 episodes of atrial fibrillation, as detailed below.  Since his visit with me, patient was seen at [facility] emergency department with chest pain. Two high-sensitivity troponins were 8 and 6 respectively.  He has consistently showed mild microcytic anemia on his blood work. He denies melena, hematochezia. He reportedly had a normal colonoscopy last year.    Current Outpatient Medications on File Prior to Visit  Medication Sig Dispense Refill  . aspirin 81 MG tablet Take 81 mg by mouth daily.    Marland Kitchen atorvastatin (LIPITOR) 20 MG tablet TAKE 1 TABLET BY MOUTH  DAILY (Patient taking differently: 20 mg. ) 90 tablet 3  . finasteride (PROSCAR) 5 MG tablet Take 5 mg by mouth daily.    . fluticasone (FLONASE) 50 MCG/ACT nasal spray Place 1 spray into both nostrils daily.    . hydrochlorothiazide (MICROZIDE) 12.5 MG capsule TAKE 1 CAPSULE BY MOUTH  DAILY 90 capsule 3  . Krill Oil 1000 MG CAPS Take 1,000 mg by mouth daily.    . meclizine (ANTIVERT) 25 MG tablet Take 1 tablet (25 mg total) by mouth 3 (three) times daily as needed for dizziness. 30 tablet 0  . Multiple Vitamin (MULTIVITAMIN) tablet Take 1 tablet by mouth daily.    . nitroGLYCERIN (NITROSTAT) 0.4 MG SL tablet Place 1 tablet (0.4 mg total) under the tongue every 5 (five) minutes as needed for chest pain. 100 tablet 3  . terazosin (HYTRIN) 5 MG capsule TAKE 1 CAPSULE BY MOUTH  DAILY AT BEDTIME 90 capsule 3   No current facility-administered medications on file prior to visit.    Cardiovascular & other pertient studies:  EKG 12/19/2019: Sinus rhythm 69 bpm. First  degree AV block. Frequent PAC's.   Outside EKG 10/11/2019: Sinus arrhythmia with first-degree AV block.  Carotid artery duplex  11/15/2019: Minimal stenosis in the right internal carotid artery (minimal). Minimal stenosis in the left internal carotid artery (minimal). Mild soft plaque bilateral carotid arteries. Antegrade right vertebral artery flow. Antegrade left vertebral artery flow.  Echocardiogram 11/15/2019: Left ventricle cavity is normal in size. Moderate concentric hypertrophy of the left ventricle. Normal LV systolic function with visual EF 50-55%. Normal global wall motion. Normal diastolic filling pattern. Trileaflet aortic valve.  Mild to moderate aortic regurgitation. Moderate (Grade II) mitral regurgitation. No evidence of pulmonary hypertension.  Event monitor 11/02/2019 - 12/01/2019: Diagnostic time: 100%  Dominant rhythm: Sinus. HR 36-136 bpm. Avg HR 64 bpm. Episodes of atrial fibrillation with RVR noted. Afib burden <1% NSVT up to 4 beats noted.  Sinus bradycardia with lowest HR 36 bpm, typically occurs during sleep hours.  Occasional PAC/PVC seen. No atrial flutter/SVT/high grade AV block, sinus pause >3sec noted. Symptoms reported: None  Recent labs: 12/16/2019: Glucose 106, BUN/Cr 16/1.2. EGFR >60. Na/K 138/3.8. Rest of the CMP normal H/H 11.5/35.9. MCV 77.9. Platelets 214 HbA1C 6.0%  02/2019: Chol 157, TG 75, HDL 52, LDL 91   Results for [facility] (MRN [id number]) as of 12/17/2019 15:34  Ref. Range 12/16/2019 09:32 12/16/2019 09:46 12/16/2019 12:47  Troponin I (High Sensitivity) Latest Ref Range: <18 ng/L 8  6  Review of Systems  Cardiovascular: Positive for chest pain (One episode of chest pressure at rest). Negative for dyspnea on exertion, leg swelling, palpitations and syncope.         Vitals:   12/19/19 0936  BP: 136/87  Pulse: 77  Temp: 98.1 F (36.7 C)  SpO2: 97%     Body mass index is 30.5 kg/m. Filed Weights   12/19/19  0936  Weight: 244 lb (110.7 kg)     Objective:   Physical Exam  Constitutional: He appears well-developed and well-nourished.  Neck: No JVD present.  Cardiovascular: Normal rate, regular rhythm, normal heart sounds and intact distal pulses.  No murmur heard. Pulmonary/Chest: Effort normal and breath sounds normal. He has no wheezes. He has no rales.  Musculoskeletal:        General: No edema.  Nursing note and vitals reviewed.         Assessment & Recommendations:   79 y.o. African American male with hypertension, hyperlipidemia, OSA, paroxysmal Afib, chest pain  Paroxysmal Afib: Afib burden <1%. Resting HR during sleep in 30s. Given symptoms are infrequent, would avoid scheduled AV nodal blocking agent or antiarrythmic therapy. Given his episodes of intermittent chest pain and NSVT, recommend Lexiscan nuclear stress test for ischemia evaluation, along with calcium score.  Emphasized the use of CPAP.  CHA2DS2VASc score 3, annual stroke risk 3.2%. While he does have mild microcytic anemia, he does not have any active bleeding. Recommend eliqus 5 mg bid with watchful monitoring for any bleeding issues.  Recommend anemia workup with PCP.  VV in 3-4 weeks.    Nigel Mormon, MD [facility] Pager: [phone] Office: [phone]

## 2019-12-18 ENCOUNTER — Telehealth: Payer: Medicare Other | Admitting: Cardiology

## 2019-12-19 ENCOUNTER — Other Ambulatory Visit: Payer: Self-pay

## 2019-12-19 ENCOUNTER — Telehealth: Payer: Medicare Other | Admitting: Cardiology

## 2019-12-19 ENCOUNTER — Encounter: Payer: Self-pay | Admitting: Cardiology

## 2019-12-19 VITALS — BP 136/87 | HR 77 | Temp 98.1°F | Ht 75.0 in | Wt 244.0 lb

## 2019-12-19 DIAGNOSIS — I480 Paroxysmal atrial fibrillation: Secondary | ICD-10-CM | POA: Diagnosis not present

## 2019-12-19 DIAGNOSIS — R0789 Other chest pain: Secondary | ICD-10-CM | POA: Diagnosis not present

## 2019-12-19 DIAGNOSIS — D649 Anemia, unspecified: Secondary | ICD-10-CM | POA: Diagnosis not present

## 2019-12-19 MED ORDER — APIXABAN 5 MG PO TABS: 5.0000 mg | ORAL_TABLET | Freq: Two times a day (BID) | ORAL | 3 refills | Status: DC

## 2019-12-25 ENCOUNTER — Ambulatory Visit (INDEPENDENT_AMBULATORY_CARE_PROVIDER_SITE_OTHER): Payer: Medicare Other

## 2019-12-25 ENCOUNTER — Other Ambulatory Visit: Payer: Self-pay

## 2019-12-25 DIAGNOSIS — I480 Paroxysmal atrial fibrillation: Secondary | ICD-10-CM

## 2019-12-30 DIAGNOSIS — G4733 Obstructive sleep apnea (adult) (pediatric): Secondary | ICD-10-CM | POA: Diagnosis not present

## 2020-01-08 ENCOUNTER — Telehealth: Payer: Medicare Other | Admitting: Cardiology

## 2020-01-08 ENCOUNTER — Other Ambulatory Visit: Payer: Self-pay

## 2020-01-08 DIAGNOSIS — I480 Paroxysmal atrial fibrillation: Secondary | ICD-10-CM | POA: Diagnosis not present

## 2020-01-08 DIAGNOSIS — Q254 Congenital malformation of aorta unspecified: Secondary | ICD-10-CM | POA: Diagnosis not present

## 2020-01-08 NOTE — Progress Notes (Signed)
Follow up visit  Subjective:   Edward Gallagher, male    DOB: 01/01/1941, 79 y.o.   MRN: [id number]   I connected with the patient on 01/08/2020 by a video enabled telemedicine application and verified that I am speaking with the correct person using two identifiers.     I discussed the limitations of evaluation and management by telemedicine and the availability of in person appointments. The patient expressed understanding and agreed to proceed.   This visit type was conducted due to national recommendations for restrictions regarding the COVID-19 Pandemic (e.g. social distancing).  This format is felt to be most appropriate for this patient at this time.  All issues noted in this document were discussed and addressed.  No physical exam was performed (except for noted visual exam findings with Tele health visits).  The patient has consented to conduct a Tele health visit and understands insurance will be billed.    HPI  79 y.o. African American male with hypertension, hyperlipidemia, OSA, paroxysmal Afib, chest pain  Patient is doing well. He denies chest pain, shortness of breath, palpitations, leg edema, orthopnea, PND, TIA/syncope. Also denies any bleeding issues.    Current Outpatient Medications on File Prior to Visit  Medication Sig Dispense Refill  . apixaban (ELIQUIS) 5 MG TABS tablet Take 1 tablet (5 mg total) by mouth 2 (two) times daily. 60 tablet 3  . atorvastatin (LIPITOR) 20 MG tablet TAKE 1 TABLET BY MOUTH  DAILY (Patient taking differently: 20 mg. ) 90 tablet 3  . finasteride (PROSCAR) 5 MG tablet Take 5 mg by mouth daily.    . fluticasone (FLONASE) 50 MCG/ACT nasal spray Place 1 spray into both nostrils daily.    . hydrochlorothiazide (MICROZIDE) 12.5 MG capsule TAKE 1 CAPSULE BY MOUTH  DAILY 90 capsule 3  . Krill Oil 1000 MG CAPS Take 1,000 mg by mouth daily.    . meclizine (ANTIVERT) 25 MG tablet Take 1 tablet (25 mg total) by mouth 3 (three) times daily as needed for  dizziness. 30 tablet 0  . Multiple Vitamin (MULTIVITAMIN) tablet Take 1 tablet by mouth daily.    . nitroGLYCERIN (NITROSTAT) 0.4 MG SL tablet Place 1 tablet (0.4 mg total) under the tongue every 5 (five) minutes as needed for chest pain. 100 tablet 3  . terazosin (HYTRIN) 5 MG capsule TAKE 1 CAPSULE BY MOUTH  DAILY AT BEDTIME 90 capsule 3   No current facility-administered medications on file prior to visit.    Cardiovascular & other pertient studies:  Lexiscan (Walking with mod Bruce)Tetrofosmin Stress Test  12/25/2019: Nondiagnostic ECG stress. Normal myocardial perfusion. Stress LV EF is mildly depressed at 42% with global hypokinesis.  No previous exam available for comparison. 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 Correlate with echocardiogram. Intermediate risk study due to low LVEF.   EKG 12/19/2019: Sinus rhythm 69 bpm. First degree AV block. Frequent PAC's.   Carotid artery duplex  11/15/2019: Minimal stenosis in the right internal carotid artery (minimal). Minimal stenosis in the left internal carotid artery (minimal). Mild soft plaque bilateral carotid arteries. Antegrade right vertebral artery flow. Antegrade left vertebral artery flow.  Echocardiogram 11/15/2019: Left ventricle cavity is normal in size. Moderate concentric hypertrophy of the left ventricle. Normal LV systolic function with visual EF 50-55%. Normal global wall motion. Normal diastolic filling pattern. Trileaflet aortic valve.  Mild to moderate aortic regurgitation. Moderate (Grade II) mitral regurgitation. No evidence of pulmonary hypertension.  Event monitor 11/02/2019 - 12/01/2019: Diagnostic time: 100%  Dominant rhythm: Sinus. HR 36-136 bpm. Avg HR 64 bpm. Episodes of atrial fibrillation with RVR noted. Afib burden <1% NSVT up to 4 beats noted.  Sinus bradycardia with lowest HR 36 bpm, typically occurs during sleep hours.  Occasional PAC/PVC seen. No atrial flutter/SVT/high grade AV  block, sinus pause >3sec noted. Symptoms reported: None  Recent labs: 12/16/2019: Glucose 106, BUN/Cr 16/1.2. EGFR >60. Na/K 138/3.8. Rest of the CMP normal H/H 11.5/35.9. MCV 77.9. Platelets 214 HbA1C 6.0%  02/2019: Chol 157, TG 75, HDL 52, LDL 91   Results for [facility] (MRN [id number]) as of 12/17/2019 15:34  Ref. Range 12/16/2019 09:32 12/16/2019 09:46 12/16/2019 12:47  Troponin I (High Sensitivity) Latest Ref Range: <18 ng/L 8  6     Review of Systems  Cardiovascular: Positive for chest pain (One episode of chest pressure at rest). Negative for dyspnea on exertion, leg swelling, palpitations and syncope.        Vitals not available.   Objective:   Physical Exam  Constitutional: He appears well-developed and well-nourished.  Neck: No JVD present.  Cardiovascular: Normal rate, regular rhythm, normal heart sounds and intact distal pulses.  No murmur heard. Pulmonary/Chest: Effort normal and breath sounds normal. He has no wheezes. He has no rales.  Musculoskeletal:        General: No edema.  Nursing note and vitals reviewed.         Assessment & Recommendations:   79 y.o. African American male with hypertension, hyperlipidemia, OSA, paroxysmal Afib, chest pain  Paroxysmal Afib: Afib burden <1%. Resting HR during sleep in 30s. Given symptoms are infrequent, would avoid scheduled AV nodal blocking agent or antiarrythmic therapy. No ischemic on stress test. Calcium score pending. Emphasized the use of CPAP.  CHA2DS2VASc score 3, annual stroke risk 3.2%. While he does have mild microcytic anemia, he does not have any active bleeding. Recommend eliqus 5 mg bid with watchful monitoring for any bleeding issues.  Recommend anemia follow up with PCP.  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 Will recheck aorta duplex.   F/u in 3 months   [place], MD [facility] Pager: [phone] Office: [phone]

## 2020-01-17 DIAGNOSIS — R0789 Other chest pain: Secondary | ICD-10-CM | POA: Diagnosis not present

## 2020-01-17 DIAGNOSIS — I2510 Atherosclerotic heart disease of native coronary artery without angina pectoris: Secondary | ICD-10-CM | POA: Diagnosis not present

## 2020-01-17 DIAGNOSIS — I4891 Unspecified atrial fibrillation: Secondary | ICD-10-CM | POA: Diagnosis not present

## 2020-01-21 ENCOUNTER — Other Ambulatory Visit: Payer: Self-pay

## 2020-01-21 ENCOUNTER — Ambulatory Visit (INDEPENDENT_AMBULATORY_CARE_PROVIDER_SITE_OTHER): Payer: Medicare Other

## 2020-01-21 DIAGNOSIS — I77811 Abdominal aortic ectasia: Secondary | ICD-10-CM | POA: Diagnosis not present

## 2020-01-21 DIAGNOSIS — Q254 Congenital malformation of aorta unspecified: Secondary | ICD-10-CM

## 2020-01-30 DIAGNOSIS — G4733 Obstructive sleep apnea (adult) (pediatric): Secondary | ICD-10-CM | POA: Diagnosis not present

## 2020-02-04 ENCOUNTER — Other Ambulatory Visit: Payer: Self-pay | Admitting: Internal Medicine

## 2020-02-13 DIAGNOSIS — R3914 Feeling of incomplete bladder emptying: Secondary | ICD-10-CM | POA: Diagnosis not present

## 2020-02-27 DIAGNOSIS — G4733 Obstructive sleep apnea (adult) (pediatric): Secondary | ICD-10-CM | POA: Diagnosis not present

## 2020-03-24 ENCOUNTER — Telehealth: Payer: Self-pay

## 2020-03-24 NOTE — Telephone Encounter (Signed)
Error

## 2020-03-25 ENCOUNTER — Ambulatory Visit (INDEPENDENT_AMBULATORY_CARE_PROVIDER_SITE_OTHER): Payer: Medicare Other | Admitting: Internal Medicine

## 2020-03-25 ENCOUNTER — Encounter: Payer: Self-pay | Admitting: Internal Medicine

## 2020-03-25 ENCOUNTER — Other Ambulatory Visit: Payer: Self-pay

## 2020-03-25 ENCOUNTER — Ambulatory Visit (INDEPENDENT_AMBULATORY_CARE_PROVIDER_SITE_OTHER): Payer: Medicare Other

## 2020-03-25 VITALS — BP 138/80 | HR 85 | Temp 98.5°F | Ht 75.0 in | Wt 250.2 lb

## 2020-03-25 VITALS — BP 138/80 | HR 85 | Temp 98.5°F | Ht 75.0 in | Wt 250.0 lb

## 2020-03-25 DIAGNOSIS — Z79899 Other long term (current) drug therapy: Secondary | ICD-10-CM

## 2020-03-25 DIAGNOSIS — Z0000 Encounter for general adult medical examination without abnormal findings: Secondary | ICD-10-CM | POA: Diagnosis not present

## 2020-03-25 DIAGNOSIS — D649 Anemia, unspecified: Secondary | ICD-10-CM | POA: Diagnosis not present

## 2020-03-25 DIAGNOSIS — I480 Paroxysmal atrial fibrillation: Secondary | ICD-10-CM | POA: Diagnosis not present

## 2020-03-25 DIAGNOSIS — E6609 Other obesity due to excess calories: Secondary | ICD-10-CM

## 2020-03-25 DIAGNOSIS — I129 Hypertensive chronic kidney disease with stage 1 through stage 4 chronic kidney disease, or unspecified chronic kidney disease: Secondary | ICD-10-CM

## 2020-03-25 DIAGNOSIS — N182 Chronic kidney disease, stage 2 (mild): Secondary | ICD-10-CM

## 2020-03-25 DIAGNOSIS — Z6831 Body mass index (BMI) 31.0-31.9, adult: Secondary | ICD-10-CM

## 2020-03-25 DIAGNOSIS — E7800 Pure hypercholesterolemia, unspecified: Secondary | ICD-10-CM | POA: Diagnosis not present

## 2020-03-25 NOTE — Progress Notes (Addendum)
This visit occurred during the SARS-CoV-2 public health emergency.  Safety protocols were in place, including screening questions prior to the visit, additional usage of staff PPE, and extensive cleaning of exam room while observing appropriate contact time as indicated for disinfecting solutions.  Subjective:   Edward Gallagher is a 79 y.o. male who presents for Medicare Annual/Subsequent preventive examination.  Review of Systems:  n/a Cardiac Risk Factors include: advanced age (>3men, >40 women);male gender;obesity (BMI >30kg/m2)     Objective:    Vitals: BP 138/80 (BP Location: Left Arm, Patient Position: Sitting)   Pulse 85   Temp 98.5 F (36.9 C) (Oral)   Ht 6\' 3"  (1.905 m)   Wt 250 lb (113.4 kg)   BMI 31.25 kg/m   Body mass index is 31.25 kg/m.  Advanced Directives 03/25/2020 12/16/2019 05/08/2019  Does Patient Have a Medical Advance Directive? No No No  Would patient like information on creating a medical advance directive? No - Patient declined - Yes (MAU/Ambulatory/Procedural Areas - Information given)    Tobacco Social History   Tobacco Use  Smoking Status Former Smoker  . Packs/day: 0.50  . Years: 35.00  . Pack years: 17.50  . Types: Cigarettes  . Quit date: 12/12/1997  . Years since quitting: 22.2  Smokeless Tobacco Never Used  Tobacco Comment   12 cigs daily     Counseling given: Not Answered Comment: 12 cigs daily   Clinical Intake:  Pre-visit preparation completed: Yes  Pain : No/denies pain     Nutritional Status: BMI > 30  Obese Nutritional Risks: None Diabetes: No  How often do you need to have someone help you when you read instructions, pamphlets, or other written materials from your doctor or pharmacy?: 1 - Never What is the last grade level you completed in school?: 12th grade  Interpreter Needed?: No  Information entered by :: NAllen LPN  Past Medical History:  Diagnosis Date  . A-fib ([place])   . Allergy   . Hyperlipidemia   .  Hypertension   . Prostatic hypertrophy   . Sinus trouble   . Sleep apnea    Past Surgical History:  Procedure Laterality Date  . HERNIA REPAIR    . VASECTOMY     Family History  Problem Relation Age of Onset  . Hypertension Other   . Prostate cancer Father   . Testicular cancer Brother   . Prostate cancer Brother   . Lung cancer Brother   . Cancer Brother        double mynoma  . Healthy Mother    Social History   Socioeconomic History  . Marital status: Married    Spouse name: Not on file  . Number of children: 0  . Years of education: Not on file  . Highest education level: Not on file  Occupational History  . Occupation: retired  Tobacco Use  . Smoking status: Former Smoker    Packs/day: 0.50    Years: 35.00    Pack years: 17.50    Types: Cigarettes    Quit date: 12/12/1997    Years since quitting: 22.2  . Smokeless tobacco: Never Used  . Tobacco comment: 12 cigs daily  Substance and Sexual Activity  . Alcohol use: Yes    Comment: ocasional beer  . Drug use: No  . Sexual activity: Not Currently  Other Topics Concern  . Not on file  Social History Narrative  . Not on file   Social Determinants of Health  Financial Resource Strain: Low Risk   . Difficulty of Paying Living Expenses: Not hard at all  Food Insecurity: No Food Insecurity  . Worried About Charity fundraiser in the Last Year: Never true  . Ran Out of Food in the Last Year: Never true  Transportation Needs: No Transportation Needs  . Lack of Transportation (Medical): No  . Lack of Transportation (Non-Medical): No  Physical Activity: Sufficiently Active  . Days of Exercise per Week: 4 days  . Minutes of Exercise per Session: 120 min  Stress: No Stress Concern Present  . Feeling of Stress : Not at all  Social Connections:   . Frequency of Communication with Friends and Family:   . Frequency of Social Gatherings with Friends and Family:   . Attends Religious Services:   . Active Member of  Clubs or Organizations:   . Attends Archivist Meetings:   Marland Kitchen Marital Status:     Outpatient Encounter Medications as of 03/25/2020  Medication Sig  . apixaban (ELIQUIS) 5 MG TABS tablet Take 1 tablet (5 mg total) by mouth 2 (two) times daily.  Marland Kitchen atorvastatin (LIPITOR) 20 MG tablet TAKE 1 TABLET BY MOUTH  DAILY (Patient taking differently: 20 mg. )  . finasteride (PROSCAR) 5 MG tablet Take 5 mg by mouth daily.  . fluticasone (FLONASE) 50 MCG/ACT nasal spray Place 1 spray into both nostrils daily.  . hydrochlorothiazide (MICROZIDE) 12.5 MG capsule TAKE 1 CAPSULE BY MOUTH  DAILY  . Krill Oil 1000 MG CAPS Take 1,000 mg by mouth daily.  . meclizine (ANTIVERT) 25 MG tablet Take 1 tablet (25 mg total) by mouth 3 (three) times daily as needed for dizziness.  . Multiple Vitamin (MULTIVITAMIN) tablet Take 1 tablet by mouth daily.  . nitroGLYCERIN (NITROSTAT) 0.4 MG SL tablet Place 1 tablet (0.4 mg total) under the tongue every 5 (five) minutes as needed for chest pain.  Marland Kitchen terazosin (HYTRIN) 5 MG capsule TAKE 1 CAPSULE BY MOUTH  DAILY AT BEDTIME   No facility-administered encounter medications on file as of 03/25/2020.    Activities of Daily Living In your present state of health, do you have any difficulty performing the following activities: 03/25/2020 05/08/2019  Hearing? N N  Vision? N N  Difficulty concentrating or making decisions? N N  Walking or climbing stairs? N N  Dressing or bathing? N N  Doing errands, shopping? N N  Preparing Food and eating ? N N  Using the Toilet? N N  In the past six months, have you accidently leaked urine? N N  Do you have problems with loss of bowel control? N N  Managing your Medications? N N  Managing your Finances? N N  Housekeeping or managing your Housekeeping? N N  Some recent data might be hidden    Patient Care Team: Glendale Chard, MD as PCP - General (Internal Medicine)   Assessment:   This is a routine wellness examination for  Edward Gallagher.  Exercise Activities and Dietary recommendations Current Exercise Habits: Home exercise routine, Type of exercise: walking(golf), Time (Minutes): > 60, Frequency (Times/Week): 4, Weekly Exercise (Minutes/Week): 0  Goals    . Weight (lb) < 200 lb (90.7 kg)     Wants to lose 20 pounds    . Weight (lb) < 200 lb (90.7 kg)     03/25/2020, wants to get to 220 pounds       Fall Risk Fall Risk  03/25/2020 09/25/2019 07/25/2019 05/08/2019 02/18/2019  Falls in  the past year? 0 0 0 0 0  Risk for fall due to : Medication side effect - - - -  Follow up Education provided;Falls prevention discussed;Falls evaluation completed - - Falls prevention discussed;Education provided -   Is the patient's home free of loose throw rugs in walkways, pet beds, electrical cords, etc?   yes      Grab bars in the bathroom? yes      Handrails on the stairs?   n/a      Adequate lighting?   yes  Timed Get Up and Go Performed: n/a  Depression Screen PHQ 2/9 Scores 03/25/2020 09/25/2019 07/25/2019 05/08/2019  PHQ - 2 Score 0 0 0 0  PHQ- 9 Score - - - 0    Cognitive Function     6CIT Screen 03/25/2020 05/08/2019  What Year? 0 points 0 points  What month? 0 points 0 points  What time? 0 points 0 points  Count back from 20 4 points 0 points  Months in reverse 0 points 0 points  Repeat phrase 0 points 0 points  Total Score 4 0    Immunization History  Administered Date(s) Administered  . Influenza-Unspecified 08/13/2015, 08/20/2018, 08/16/2019  . Pneumococcal Conjugate-13 08/13/2015  . Zoster 12/12/2013  . Zoster Recombinat (Shingrix) 08/16/2019    Qualifies for Shingles Vaccine? yes  Screening Tests Health Maintenance  Topic Date Due  . PNA vac Low Risk Adult (2 of 2 - PPSV23) 03/25/2021 (Originally 08/12/2016)  . INFLUENZA VACCINE  07/12/2020  . TETANUS/TDAP  12/25/2022   Cancer Screenings: Lung: Low Dose CT Chest recommended if Age 17-80 years, 30 pack-year currently smoking OR have quit w/in  15years. Patient does not qualify. Colorectal: not required  Additional Screenings:  Hepatitis C Screening:n/a      Plan:    Patient wants to get to 220 pounds.  I have personally reviewed and noted the following in the patient's chart:   . Medical and social history . Use of alcohol, tobacco or illicit drugs  . Current medications and supplements . Functional ability and status . Nutritional status . Physical activity . Advanced directives . List of other physicians . Hospitalizations, surgeries, and ER visits in previous 12 months . Vitals . Screenings to include cognitive, depression, and falls . Referrals and appointments  In addition, I have reviewed and discussed with patient certain preventive protocols, quality metrics, and best practice recommendations. A written personalized care plan for preventive services as well as general preventive health recommendations were provided to patient.     Kellie Simmering, LPN  [id number]

## 2020-03-25 NOTE — Patient Instructions (Signed)
Chronic Kidney Disease, Adult Chronic kidney disease (CKD) happens when the kidneys are damaged over a long period of time. The kidneys are two organs that help with:  Getting rid of waste and extra fluid from the blood.  Making hormones that maintain the amount of fluid in your tissues and blood vessels.  Making sure that the body has the right amount of fluids and chemicals. Most of the time, CKD does not go away, but it can usually be controlled. Steps must be taken to slow down the kidney damage or to stop it from getting worse. If this is not done, the kidneys may stop working. Follow these instructions at home: Medicines  Take over-the-counter and prescription medicines only as told by your doctor. You may need to change the amount of medicines you take.  Do not take any new medicines unless your doctor says it is okay. Many medicines can make your kidney damage worse.  Do not take any vitamin and supplements unless your doctor says it is okay. Many vitamins and supplements can make your kidney damage worse. General instructions  Follow a diet as told by your doctor. You may need to stay away from: ? Alcohol. ? Salty foods. ? Foods that are high in:  Potassium.  Calcium.  Protein.  Do not use any products that contain nicotine or tobacco, such as cigarettes and e-cigarettes. If you need help quitting, ask your doctor.  Keep track of your blood pressure at home. Tell your doctor about any changes.  If you have diabetes, keep track of your blood sugar as told by your doctor.  Try to stay at a healthy weight. If you need help, ask your doctor.  Exercise at least 30 minutes a day, 5 days a week.  Stay up-to-date with your shots (immunizations) as told by your doctor.  Keep all follow-up visits as told by your doctor. This is important. Contact a doctor if:  Your symptoms get worse.  You have new symptoms. Get help right away if:  You have symptoms of end-stage  kidney disease. These may include: ? Headaches. ? Numbness in your hands or feet. ? Easy bruising. ? Having hiccups often. ? Chest pain. ? Shortness of breath. ? Stopping of menstrual periods in women.  You have a fever.  You have very little pee (urine).  You have pain or bleeding when you pee. Summary  Chronic kidney disease (CKD) happens when the kidneys are damaged over a long period of time.  Most of the time, this condition does not go away, but it can usually be controlled. Steps must be taken to slow down the kidney damage or to stop it from getting worse.  Treatment may include a combination of medicines and lifestyle changes. This information is not intended to replace advice given to you by your health care provider. Make sure you discuss any questions you have with your health care provider. Document Revised: 11/10/2017 Document Reviewed: 01/02/2017 Elsevier Patient Education  2020 [employer]  

## 2020-03-25 NOTE — Patient Instructions (Signed)
Edward Gallagher , Thank you for taking time to come for your Medicare Wellness Visit. I appreciate your ongoing commitment to your health goals. Please review the following plan we discussed and let me know if I can assist you in the future.   Screening recommendations/referrals: Colonoscopy: not required Recommended yearly ophthalmology/optometry visit for glaucoma screening and checkup Recommended yearly dental visit for hygiene and checkup  Vaccinations: Influenza vaccine: 08/2019 Pneumococcal vaccine: 08/2015 Tdap vaccine: 12/2012 Shingles vaccine: 10/2019    Advanced directives: Advance directive discussed with you today. Even though you declined this today please call our office should you change your mind and we can give you the proper paperwork for you to fill out.   Conditions/risks identified: obesity  Next appointment: 10/06/2020 at 9:15  Preventive Care 50 Years and Older, Male Preventive care refers to lifestyle choices and visits with your health care provider that can promote health and wellness. What does preventive care include?  A yearly physical exam. This is also called an annual well check.  Dental exams once or twice a year.  Routine eye exams. Ask your health care provider how often you should have your eyes checked.  Personal lifestyle choices, including:  Daily care of your teeth and gums.  Regular physical activity.  Eating a healthy diet.  Avoiding tobacco and drug use.  Limiting alcohol use.  Practicing safe sex.  Taking low doses of aspirin every day.  Taking vitamin and mineral supplements as recommended by your health care provider. What happens during an annual well check? The services and screenings done by your health care provider during your annual well check will depend on your age, overall health, lifestyle risk factors, and family history of disease. Counseling  Your health care provider may ask you questions about your:  Alcohol  use.  Tobacco use.  Drug use.  Emotional well-being.  Home and relationship well-being.  Sexual activity.  Eating habits.  History of falls.  Memory and ability to understand (cognition).  Work and work Statistician. Screening  You may have the following tests or measurements:  Height, weight, and BMI.  Blood pressure.  Lipid and cholesterol levels. These may be checked every 5 years, or more frequently if you are over 12 years old.  Skin check.  Lung cancer screening. You may have this screening every year starting at age 26 if you have a 30-pack-year history of smoking and currently smoke or have quit within the past 15 years.  Fecal occult blood test (FOBT) of the stool. You may have this test every year starting at age 35.  Flexible sigmoidoscopy or colonoscopy. You may have a sigmoidoscopy every 5 years or a colonoscopy every 10 years starting at age 57.  Prostate cancer screening. Recommendations will vary depending on your family history and other risks.  Hepatitis C blood test.  Hepatitis B blood test.  Sexually transmitted disease (STD) testing.  Diabetes screening. This is done by checking your blood sugar (glucose) after you have not eaten for a while (fasting). You may have this done every 1-3 years.  Abdominal aortic aneurysm (AAA) screening. You may need this if you are a current or former smoker.  Osteoporosis. You may be screened starting at age 3 if you are at high risk. Talk with your health care provider about your test results, treatment options, and if necessary, the need for more tests. Vaccines  Your health care provider may recommend certain vaccines, such as:  Influenza vaccine. This is recommended every  year.  Tetanus, diphtheria, and acellular pertussis (Tdap, Td) vaccine. You may need a Td booster every 10 years.  Zoster vaccine. You may need this after age 42.  Pneumococcal 13-valent conjugate (PCV13) vaccine. One dose is  recommended after age 19.  Pneumococcal polysaccharide (PPSV23) vaccine. One dose is recommended after age 72. Talk to your health care provider about which screenings and vaccines you need and how often you need them. This information is not intended to replace advice given to you by your health care provider. Make sure you discuss any questions you have with your health care provider. Document Released: 12/25/2015 Document Revised: 08/17/2016 Document Reviewed: 09/29/2015 Elsevier Interactive Patient Education  2017 [employer] Prevention in the Home Falls can cause injuries. They can happen to people of all ages. There are many things you can do to make your home safe and to help prevent falls. What can I do on the outside of my home?  Regularly fix the edges of walkways and driveways and fix any cracks.  Remove anything that might make you trip as you walk through a door, such as a raised step or threshold.  Trim any bushes or trees on the path to your home.  Use bright outdoor lighting.  Clear any walking paths of anything that might make someone trip, such as rocks or tools.  Regularly check to see if handrails are loose or broken. Make sure that both sides of any steps have handrails.  Any raised decks and porches should have guardrails on the edges.  Have any leaves, snow, or ice cleared regularly.  Use sand or salt on walking paths during winter.  Clean up any spills in your garage right away. This includes oil or grease spills. What can I do in the bathroom?  Use night lights.  Install grab bars by the toilet and in the tub and shower. Do not use towel bars as grab bars.  Use non-skid mats or decals in the tub or shower.  If you need to sit down in the shower, use a plastic, non-slip stool.  Keep the floor dry. Clean up any water that spills on the floor as soon as it happens.  Remove soap buildup in the tub or shower regularly.  Attach bath mats  securely with double-sided non-slip rug tape.  Do not have throw rugs and other things on the floor that can make you trip. What can I do in the bedroom?  Use night lights.  Make sure that you have a light by your bed that is easy to reach.  Do not use any sheets or blankets that are too big for your bed. They should not hang down onto the floor.  Have a firm chair that has side arms. You can use this for support while you get dressed.  Do not have throw rugs and other things on the floor that can make you trip. What can I do in the kitchen?  Clean up any spills right away.  Avoid walking on wet floors.  Keep items that you use a lot in easy-to-reach places.  If you need to reach something above you, use a strong step stool that has a grab bar.  Keep electrical cords out of the way.  Do not use floor polish or wax that makes floors slippery. If you must use wax, use non-skid floor wax.  Do not have throw rugs and other things on the floor that can make you trip. What can  I do with my stairs?  Do not leave any items on the stairs.  Make sure that there are handrails on both sides of the stairs and use them. Fix handrails that are broken or loose. Make sure that handrails are as long as the stairways.  Check any carpeting to make sure that it is firmly attached to the stairs. Fix any carpet that is loose or worn.  Avoid having throw rugs at the top or bottom of the stairs. If you do have throw rugs, attach them to the floor with carpet tape.  Make sure that you have a light switch at the top of the stairs and the bottom of the stairs. If you do not have them, ask someone to add them for you. What else can I do to help prevent falls?  Wear shoes that:  Do not have high heels.  Have rubber bottoms.  Are comfortable and fit you well.  Are closed at the toe. Do not wear sandals.  If you use a stepladder:  Make sure that it is fully opened. Do not climb a closed  stepladder.  Make sure that both sides of the stepladder are locked into place.  Ask someone to hold it for you, if possible.  Clearly mark and make sure that you can see:  Any grab bars or handrails.  First and last steps.  Where the edge of each step is.  Use tools that help you move around (mobility aids) if they are needed. These include:  Canes.  Walkers.  Scooters.  Crutches.  Turn on the lights when you go into a dark area. Replace any light bulbs as soon as they burn out.  Set up your furniture so you have a clear path. Avoid moving your furniture around.  If any of your floors are uneven, fix them.  If there are any pets around you, be aware of where they are.  Review your medicines with your doctor. Some medicines can make you feel dizzy. This can increase your chance of falling. Ask your doctor what other things that you can do to help prevent falls. This information is not intended to replace advice given to you by your health care provider. Make sure you discuss any questions you have with your health care provider. Document Released: 09/24/2009 Document Revised: 05/05/2016 Document Reviewed: 01/02/2015 Elsevier Interactive Patient Education  2017 [employer].

## 2020-03-26 LAB — BMP8+EGFR
BUN/Creatinine Ratio: 25 — ABNORMAL HIGH (ref 10–24)
BUN: 18 mg/dL (ref 8–27)
CO2: 25 mmol/L (ref 20–29)
Calcium: 10.6 mg/dL — ABNORMAL HIGH (ref 8.6–10.2)
Chloride: 104 mmol/L (ref 96–106)
Creatinine, Ser: 0.71 mg/dL — ABNORMAL LOW (ref 0.76–1.27)
GFR calc Af Amer: 104 mL/min/{1.73_m2} (ref >59)
GFR calc non Af Amer: 90 mL/min/{1.73_m2} (ref >59)
Glucose: 83 mg/dL (ref 65–99)
Potassium: 4.7 mmol/L (ref 3.5–5.2)
Sodium: 141 mmol/L (ref 134–144)

## 2020-03-26 LAB — CBC WITH DIFFERENTIAL/PLATELET
Basophils Absolute: 0 10*3/uL (ref 0.0–0.2)
Basos: 1 %
EOS (ABSOLUTE): 0.2 10*3/uL (ref 0.0–0.4)
Eos: 7 %
Hematocrit: 37.4 % — ABNORMAL LOW (ref 37.5–51.0)
Hemoglobin: 13 g/dL (ref 13.0–17.7)
Immature Grans (Abs): 0 10*3/uL (ref 0.0–0.1)
Immature Granulocytes: 0 %
Lymphocytes Absolute: 1.5 10*3/uL (ref 0.7–3.1)
Lymphs: 44 %
MCH: 33.9 pg — ABNORMAL HIGH (ref 26.6–33.0)
MCHC: 34.8 g/dL (ref 31.5–35.7)
MCV: 98 fL — ABNORMAL HIGH (ref 79–97)
Monocytes Absolute: 0.2 10*3/uL (ref 0.1–0.9)
Monocytes: 7 %
Neutrophils Absolute: 1.4 10*3/uL (ref 1.4–7.0)
Neutrophils: 41 %
Platelets: 193 10*3/uL (ref 150–450)
RBC: 3.83 x10E6/uL — ABNORMAL LOW (ref 4.14–5.80)
RDW: 13.1 % (ref 11.6–15.4)
WBC: 3.3 10*3/uL — ABNORMAL LOW (ref 3.4–10.8)

## 2020-03-26 LAB — IRON AND TIBC
Iron Saturation: 43 % (ref 15–55)
Iron: 101 ug/dL (ref 38–169)
Total Iron Binding Capacity: 237 ug/dL — ABNORMAL LOW (ref 250–450)
UIBC: 136 ug/dL (ref 111–343)

## 2020-03-26 LAB — LIPID PANEL
Chol/HDL Ratio: 2.7 ratio (ref 0.0–5.0)
Cholesterol, Total: 163 mg/dL (ref 100–199)
HDL: 60 mg/dL (ref >39)
LDL Chol Calc (NIH): 92 mg/dL (ref 0–99)
Triglycerides: 52 mg/dL (ref 0–149)
VLDL Cholesterol Cal: 11 mg/dL (ref 5–40)

## 2020-03-26 LAB — FERRITIN: Ferritin: 223 ng/mL (ref 30–400)

## 2020-03-27 DIAGNOSIS — M25552 Pain in left hip: Secondary | ICD-10-CM | POA: Diagnosis not present

## 2020-03-28 MED ORDER — ATORVASTATIN CALCIUM 40 MG PO TABS: 40.0000 mg | ORAL_TABLET | Freq: Every day | ORAL | 1 refills | Status: DC

## 2020-03-28 NOTE — Progress Notes (Signed)
This visit occurred during the SARS-CoV-2 public health emergency.  Safety protocols were in place, including screening questions prior to the visit, additional usage of staff PPE, and extensive cleaning of exam room while observing appropriate contact time as indicated for disinfecting solutions.  Subjective:     Patient ID: Edward Gallagher , male    DOB: [birth year] , 79 y.o.   MRN: [id number]   Chief Complaint  Patient presents with  . Hypertension    HPI  He presents today for a blood pressure check.  He reports compliance with meds. He denies headaches, chest pain and shortness of breath.   Hypertension This is a chronic problem. The current episode started more than 1 year ago. The problem has been gradually improving since onset. The problem is controlled. Pertinent negatives include no blurred vision, chest pain, palpitations or shortness of breath. Risk factors for coronary artery disease include male gender and obesity.     Past Medical History:  Diagnosis Date  . A-fib ([place])   . Allergy   . Hyperlipidemia   . Hypertension   . Prostatic hypertrophy   . Sinus trouble   . Sleep apnea      Family History  Problem Relation Age of Onset  . Hypertension Other   . Prostate cancer Father   . Testicular cancer Brother   . Prostate cancer Brother   . Lung cancer Brother   . Cancer Brother        double mynoma  . Healthy Mother      Current Outpatient Medications:  .  apixaban (ELIQUIS) 5 MG TABS tablet, Take 1 tablet (5 mg total) by mouth 2 (two) times daily., Disp: 60 tablet, Rfl: 3 .  atorvastatin (LIPITOR) 20 MG tablet, TAKE 1 TABLET BY MOUTH  DAILY (Patient taking differently: 20 mg. ), Disp: 90 tablet, Rfl: 3 .  finasteride (PROSCAR) 5 MG tablet, Take 5 mg by mouth daily., Disp: , Rfl:  .  fluticasone (FLONASE) 50 MCG/ACT nasal spray, Place 1 spray into both nostrils daily., Disp: , Rfl:  .  hydrochlorothiazide (MICROZIDE) 12.5 MG capsule, TAKE 1 CAPSULE BY MOUTH   DAILY, Disp: 90 capsule, Rfl: 3 .  Krill Oil 1000 MG CAPS, Take 1,000 mg by mouth daily., Disp: , Rfl:  .  meclizine (ANTIVERT) 25 MG tablet, Take 1 tablet (25 mg total) by mouth 3 (three) times daily as needed for dizziness., Disp: 30 tablet, Rfl: 0 .  Multiple Vitamin (MULTIVITAMIN) tablet, Take 1 tablet by mouth daily., Disp: , Rfl:  .  nitroGLYCERIN (NITROSTAT) 0.4 MG SL tablet, Place 1 tablet (0.4 mg total) under the tongue every 5 (five) minutes as needed for chest pain., Disp: 100 tablet, Rfl: 3 .  terazosin (HYTRIN) 5 MG capsule, TAKE 1 CAPSULE BY MOUTH  DAILY AT BEDTIME, Disp: 90 capsule, Rfl: 3   No Known Allergies   Review of Systems  Constitutional: Negative.   Eyes: Negative for blurred vision.  Respiratory: Negative.  Negative for shortness of breath.   Cardiovascular: Negative.  Negative for chest pain and palpitations.  Gastrointestinal: Negative.   Neurological: Negative.   Psychiatric/Behavioral: Negative.      Today's Vitals   03/25/20 1008  BP: 138/80  Pulse: 85  Temp: 98.5 F (36.9 C)  TempSrc: Oral  SpO2: 95%  Weight: 250 lb 3.2 oz (113.5 kg)  Height: '6\' 3"'  (1.905 m)   Body mass index is 31.27 kg/m.   Objective:  Physical Exam Vitals and nursing note  reviewed.  Constitutional:      Appearance: Normal appearance.  Cardiovascular:     Rate and Rhythm: Normal rate and regular rhythm.     Heart sounds: Normal heart sounds.  Pulmonary:     Effort: Pulmonary effort is normal.     Breath sounds: Normal breath sounds.  Skin:    General: Skin is warm.  Neurological:     General: No focal deficit present.     Mental Status: He is alert.  Psychiatric:        Mood and Affect: Mood normal.         Assessment And Plan:     1. Hypertensive nephropathy  Chronic, fair control. He will continue with current meds. He is advised optimal BP is less than 130/80. I will check renal function.   - BMP8+EGFR  2. Chronic renal disease, stage II  Chronic. I  will check renal function today. He is encouraged to stay well hydrated.   3. Paroxysmal atrial fibrillation (HCC)  Chronic, yet stable. He is currently rate- controlled and properly anticoagulated.   4. Anemia, unspecified type  I will recheck CBC and iron levels. He was also given stool cards to complete. He was advised of all things included in stool card kit and advised of instructions. I will make further recommendations once his labs are available for review.   - CBC with Diff - Iron and IBC ([id number]) - Ferritin  5. Pure hypercholesterolemia  Chronic, I will check fasting lipid panel. He is currently taking atorvastatin 61m daily.   - Lipid panel  6. Drug therapy   7. Class 1 obesity due to excess calories with serious comorbidity and body mass index (BMI) of 31.0 to 31.9 in adult  He is encouraged to strive for BMI less than 28 to decrease cardiac risk.  He is advised to exercise 30 minutes five days per week.    RMaximino Greenland MD    THE PATIENT IS ENCOURAGED TO PRACTICE SOCIAL DISTANCING DUE TO THE COVID-19 PANDEMIC.

## 2020-03-29 DIAGNOSIS — G4733 Obstructive sleep apnea (adult) (pediatric): Secondary | ICD-10-CM | POA: Diagnosis not present

## 2020-03-31 ENCOUNTER — Other Ambulatory Visit (INDEPENDENT_AMBULATORY_CARE_PROVIDER_SITE_OTHER): Payer: Medicare Other

## 2020-03-31 DIAGNOSIS — D649 Anemia, unspecified: Secondary | ICD-10-CM

## 2020-03-31 LAB — HEMOCCULT GUIAC POC 1CARD (OFFICE)
Card #1 Date: 4152021
Card #2 Date: 4162021
Card #2 Fecal Occult Blod, POC: POSITIVE
Card #3 Date: 4172021
Card #3 Fecal Occult Blood, POC: POSITIVE
Fecal Occult Blood, POC: NEGATIVE

## 2020-03-31 NOTE — Progress Notes (Signed)
STO

## 2020-04-01 ENCOUNTER — Other Ambulatory Visit: Payer: Self-pay | Admitting: Internal Medicine

## 2020-04-01 DIAGNOSIS — R195 Other fecal abnormalities: Secondary | ICD-10-CM

## 2020-04-03 ENCOUNTER — Encounter: Payer: Self-pay | Admitting: Internal Medicine

## 2020-04-09 ENCOUNTER — Ambulatory Visit: Payer: Medicare Other | Admitting: Cardiology

## 2020-04-09 ENCOUNTER — Other Ambulatory Visit: Payer: Self-pay | Admitting: Cardiology

## 2020-04-09 DIAGNOSIS — I480 Paroxysmal atrial fibrillation: Secondary | ICD-10-CM

## 2020-04-13 DIAGNOSIS — I10 Essential (primary) hypertension: Secondary | ICD-10-CM | POA: Diagnosis not present

## 2020-04-13 DIAGNOSIS — R195 Other fecal abnormalities: Secondary | ICD-10-CM | POA: Diagnosis not present

## 2020-04-13 DIAGNOSIS — D509 Iron deficiency anemia, unspecified: Secondary | ICD-10-CM | POA: Diagnosis not present

## 2020-04-22 NOTE — Progress Notes (Signed)
Follow up visit  Subjective:   Edward Gallagher, male    DOB: 09-24-1941, 79 y.o.   MRN: [id number]   HPI  79 y.o. African American male with hypertension, hyperlipidemia, OSA, paroxysmal Afib, chest pain.  Patient has not had any recurrence of chest pain, palpitations, lightheadedness.  He is compliant with medical therapy.  He has had 2+ FOBT.  He is scheduled to undergo colonoscopy on 05/05/2020 by Dr. Veverly Fells.  He does not have any obvious melena.  Reviewed calcium score scan with the patient, details below.   Current Outpatient Medications on File Prior to Visit  Medication Sig Dispense Refill  . atorvastatin (LIPITOR) 40 MG tablet Take 1 tablet (40 mg total) by mouth daily. 90 tablet 1  . ELIQUIS 5 MG TABS tablet TAKE 1 TABLET(5 MG) BY MOUTH TWICE DAILY 180 tablet 3  . finasteride (PROSCAR) 5 MG tablet Take 5 mg by mouth daily.    . fluticasone (FLONASE) 50 MCG/ACT nasal spray Place 1 spray into both nostrils daily.    . hydrochlorothiazide (MICROZIDE) 12.5 MG capsule TAKE 1 CAPSULE BY MOUTH  DAILY 90 capsule 3  . Krill Oil 1000 MG CAPS Take 1,000 mg by mouth daily.    . meclizine (ANTIVERT) 25 MG tablet Take 1 tablet (25 mg total) by mouth 3 (three) times daily as needed for dizziness. 30 tablet 0  . Multiple Vitamin (MULTIVITAMIN) tablet Take 1 tablet by mouth daily.    . nitroGLYCERIN (NITROSTAT) 0.4 MG SL tablet Place 1 tablet (0.4 mg total) under the tongue every 5 (five) minutes as needed for chest pain. 100 tablet 3  . terazosin (HYTRIN) 5 MG capsule TAKE 1 CAPSULE BY MOUTH  DAILY AT BEDTIME 90 capsule 3   No current facility-administered medications on file prior to visit.    Cardiovascular & other pertient studies:  EKG 04/24/2020:  Sinus rhythm 72 bpm Occasional PAC  Calcium score 01/2020: - Total Calcium Score: 38. -- Left Main: 29. -- Right Coronary: 0 -- Left Anterior Descending: 9. -- Circumflex: 0.  Lexiscan (Walking with mod Bruce)Tetrofosmin  Stress Test  12/25/2019: Nondiagnostic ECG stress. Normal myocardial perfusion. Stress LV EF is mildly depressed at 42% with global hypokinesis.  No previous exam available for comparison. 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 Correlate with echocardiogram. Intermediate risk study due to low LVEF.   Carotid artery duplex  11/15/2019: Minimal stenosis in the right internal carotid artery (minimal). Minimal stenosis in the left internal carotid artery (minimal). Mild soft plaque bilateral carotid arteries. Antegrade right vertebral artery flow. Antegrade left vertebral artery flow.  Echocardiogram 11/15/2019: Left ventricle cavity is normal in size. Moderate concentric hypertrophy of the left ventricle. Normal LV systolic function with visual EF 50-55%. Normal global wall motion. Normal diastolic filling pattern. Trileaflet aortic valve.  Mild to moderate aortic regurgitation. Moderate (Grade II) mitral regurgitation. No evidence of pulmonary hypertension.  Event monitor 11/02/2019 - 12/01/2019: Diagnostic time: 100%  Dominant rhythm: Sinus. HR 36-136 bpm. Avg HR 64 bpm. Episodes of atrial fibrillation with RVR noted. Afib burden <1% NSVT up to 4 beats noted.  Sinus bradycardia with lowest HR 36 bpm, typically occurs during sleep hours.  Occasional PAC/PVC seen. No atrial flutter/SVT/high grade AV block, sinus pause >3sec noted. Symptoms reported: None  Recent labs:   Lab Results  Component Value Date   OCCULTBLD Negative 03/31/2020   OCCULTBLD Positive 03/31/2020   OCCULTBLD Positive 03/31/2020   03/25/2020: Glucose 83, BUN/Cr 18/0.71. EGFR 104. Na/K  141/4.7. Calcium 10.6. Rest of the CMP normal H/H 13/37.4. MCV 98. Platelets 193. WBC 3.3. RBC 3.83 Chol 157, TG 52, HDL 60, LDL 92  12/16/2019: Glucose 106, BUN/Cr 16/1.2. EGFR >60. Na/K 138/3.8. Rest of the CMP normal H/H 11.5/35.9. MCV 77.9. Platelets 214 HbA1C 6.0% Troponin I 6  02/2019: Chol 157, TG 75, HDL  52, LDL 91   Review of Systems  Cardiovascular: Positive for chest pain (One episode of chest pressure at rest). Negative for dyspnea on exertion, leg swelling, palpitations and syncope.         Vitals:   04/24/20 1336  BP: 140/70  Pulse: 79  Resp: 18  Temp: 98 F (36.7 C)  SpO2: 95%      Objective:   Physical Exam  Constitutional: He appears well-developed and well-nourished.  Neck: No JVD present.  Cardiovascular: Normal rate, regular rhythm, normal heart sounds and intact distal pulses.  No murmur heard. Pulmonary/Chest: Effort normal and breath sounds normal. He has no wheezes. He has no rales.  Musculoskeletal:        General: No edema.  Nursing note and vitals reviewed.         Assessment & Recommendations:   79 y.o. African American male with hypertension, hyperlipidemia, OSA, paroxysmal Afib, chest pain.  Paroxysmal Afib: Afib burden <1%. Resting HR during sleep in 30s. Given symptoms are infrequent, would avoid scheduled AV nodal blocking agent or antiarrythmic therapy. No ischemic on stress test. Calcium score pending. Emphasized the use of CPAP.  CHA2DS2VASc score 3, annual stroke risk 3.2%. While he has had positive FOBT, his hemoglobin has in fact improved.  No obvious melena noted.  Reasonable to continue Eliquis 5 mg twice daily at this time, which should be held for 2 days before his upcoming colonoscopy.  If he is found to have any significant abnormalities on colonoscopy, will readdress anticoagulation.  If not, continue Eliquis 5 mg twice daily.    Elevated calcium score: No significant angina symptoms at this time.  Continue risk factor modification, including statin.  Due to ongoing use of Eliquis, do not recommend aspirin.  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 Will recheck aorta duplex.   F/u in 6 months   Manish Esther Hardy, MD [facility] Pager: [phone] Office: [phone]

## 2020-04-24 ENCOUNTER — Ambulatory Visit: Payer: Medicare Other | Admitting: Cardiology

## 2020-04-24 ENCOUNTER — Encounter: Payer: Self-pay | Admitting: Cardiology

## 2020-04-24 ENCOUNTER — Other Ambulatory Visit: Payer: Self-pay

## 2020-04-24 VITALS — BP 140/70 | HR 79 | Temp 98.0°F | Resp 18 | Ht 75.0 in | Wt 251.0 lb

## 2020-04-24 DIAGNOSIS — R931 Abnormal findings on diagnostic imaging of heart and coronary circulation: Secondary | ICD-10-CM

## 2020-04-24 DIAGNOSIS — R0789 Other chest pain: Secondary | ICD-10-CM

## 2020-04-24 DIAGNOSIS — R195 Other fecal abnormalities: Secondary | ICD-10-CM

## 2020-04-24 DIAGNOSIS — I480 Paroxysmal atrial fibrillation: Secondary | ICD-10-CM

## 2020-04-28 DIAGNOSIS — G4733 Obstructive sleep apnea (adult) (pediatric): Secondary | ICD-10-CM | POA: Diagnosis not present

## 2020-05-05 DIAGNOSIS — K5730 Diverticulosis of large intestine without perforation or abscess without bleeding: Secondary | ICD-10-CM | POA: Diagnosis not present

## 2020-05-05 DIAGNOSIS — D509 Iron deficiency anemia, unspecified: Secondary | ICD-10-CM | POA: Diagnosis not present

## 2020-05-05 DIAGNOSIS — R195 Other fecal abnormalities: Secondary | ICD-10-CM | POA: Diagnosis not present

## 2020-05-05 LAB — HM COLONOSCOPY

## 2020-05-12 ENCOUNTER — Ambulatory Visit: Payer: Medicare Other

## 2020-05-25 ENCOUNTER — Encounter: Payer: Self-pay | Admitting: Internal Medicine

## 2020-07-29 ENCOUNTER — Other Ambulatory Visit: Payer: Self-pay | Admitting: Internal Medicine

## 2020-08-12 DIAGNOSIS — H2513 Age-related nuclear cataract, bilateral: Secondary | ICD-10-CM | POA: Diagnosis not present

## 2020-09-09 ENCOUNTER — Encounter: Payer: Self-pay | Admitting: Internal Medicine

## 2020-09-20 ENCOUNTER — Other Ambulatory Visit: Payer: Self-pay | Admitting: Internal Medicine

## 2020-10-06 ENCOUNTER — Ambulatory Visit: Payer: Self-pay

## 2020-10-06 ENCOUNTER — Ambulatory Visit (INDEPENDENT_AMBULATORY_CARE_PROVIDER_SITE_OTHER): Payer: Medicare Other | Admitting: Internal Medicine

## 2020-10-06 ENCOUNTER — Other Ambulatory Visit: Payer: Self-pay

## 2020-10-06 ENCOUNTER — Encounter: Payer: Self-pay | Admitting: Internal Medicine

## 2020-10-06 VITALS — BP 134/72 | HR 77 | Temp 98.1°F | Ht 76.0 in | Wt 246.4 lb

## 2020-10-06 DIAGNOSIS — N182 Chronic kidney disease, stage 2 (mild): Secondary | ICD-10-CM | POA: Diagnosis not present

## 2020-10-06 DIAGNOSIS — I129 Hypertensive chronic kidney disease with stage 1 through stage 4 chronic kidney disease, or unspecified chronic kidney disease: Secondary | ICD-10-CM | POA: Diagnosis not present

## 2020-10-06 DIAGNOSIS — R7309 Other abnormal glucose: Secondary | ICD-10-CM

## 2020-10-06 DIAGNOSIS — M1612 Unilateral primary osteoarthritis, left hip: Secondary | ICD-10-CM

## 2020-10-06 DIAGNOSIS — I77811 Abdominal aortic ectasia: Secondary | ICD-10-CM

## 2020-10-06 DIAGNOSIS — Z1159 Encounter for screening for other viral diseases: Secondary | ICD-10-CM

## 2020-10-06 DIAGNOSIS — E559 Vitamin D deficiency, unspecified: Secondary | ICD-10-CM | POA: Diagnosis not present

## 2020-10-06 DIAGNOSIS — Z0000 Encounter for general adult medical examination without abnormal findings: Secondary | ICD-10-CM

## 2020-10-06 LAB — POCT URINALYSIS DIPSTICK
Bilirubin, UA: NEGATIVE
Blood, UA: NEGATIVE
Glucose, UA: NEGATIVE
Ketones, UA: NEGATIVE
Leukocytes, UA: NEGATIVE
Nitrite, UA: NEGATIVE
Protein, UA: POSITIVE — AB
Spec Grav, UA: 1.02 (ref 1.010–1.025)
Urobilinogen, UA: 0.2 E.U./dL
pH, UA: 7 (ref 5.0–8.0)

## 2020-10-06 LAB — POCT UA - MICROALBUMIN
Creatinine, POC: 300 mg/dL
Microalbumin Ur, POC: 80 mg/L

## 2020-10-06 NOTE — Progress Notes (Deleted)
I,Isiaah Cuervo,acting as a Education administrator for Maximino Greenland, MD.,have documented all relevant documentation on the behalf of Maximino Greenland, MD,as directed by  Maximino Greenland, MD while in the presence of Maximino Greenland, MD.  This visit occurred during the SARS-CoV-2 public health emergency.  Safety protocols were in place, including screening questions prior to the visit, additional usage of staff PPE, and extensive cleaning of exam room while observing appropriate contact time as indicated for disinfecting solutions.  Subjective:     Patient ID: Edward Gallagher , male    DOB: 1941-05-19 , 79 y.o.   MRN: [id number]   Chief Complaint  Patient presents with  . Annual Exam    HPI  He is here today for a full physical examination  Hypertension This is a chronic problem. The current episode started more than 1 year ago. The problem has been gradually improving since onset. The problem is uncontrolled. Pertinent negatives include no blurred vision, chest pain or shortness of breath. Risk factors for coronary artery disease include male gender. Past treatments include lifestyle changes and diuretics. The current treatment provides moderate improvement.     Past Medical History:  Diagnosis Date  . A-fib ([place])   . Allergy   . Hyperlipidemia   . Hypertension   . Prostatic hypertrophy   . Sinus trouble   . Sleep apnea      Family History  Problem Relation Age of Onset  . Hypertension Other   . Prostate cancer Father   . Testicular cancer Brother   . Prostate cancer Brother   . Lung cancer Brother   . Cancer Brother        double mynoma  . Healthy Mother      Current Outpatient Medications:  .  atorvastatin (LIPITOR) 20 MG tablet, TAKE 1 TABLET BY MOUTH  DAILY, Disp: 90 tablet, Rfl: 3 .  atorvastatin (LIPITOR) 40 MG tablet, Take 0.5 tablets (20 mg total) by mouth daily., Disp: 90 tablet, Rfl: 1 .  ELIQUIS 5 MG TABS tablet, TAKE 1 TABLET(5 MG) BY MOUTH TWICE DAILY, Disp: 180  tablet, Rfl: 3 .  finasteride (PROSCAR) 5 MG tablet, Take 5 mg by mouth daily., Disp: , Rfl:  .  fluticasone (FLONASE) 50 MCG/ACT nasal spray, Place 1 spray into both nostrils daily., Disp: , Rfl:  .  hydrochlorothiazide (MICROZIDE) 12.5 MG capsule, TAKE 1 CAPSULE BY MOUTH  DAILY, Disp: 90 capsule, Rfl: 3 .  Krill Oil 1000 MG CAPS, Take 1,000 mg by mouth daily., Disp: , Rfl:  .  Multiple Vitamin (MULTIVITAMIN) tablet, Take 1 tablet by mouth daily., Disp: , Rfl:  .  nitroGLYCERIN (NITROSTAT) 0.4 MG SL tablet, Place 1 tablet (0.4 mg total) under the tongue every 5 (five) minutes as needed for chest pain. (Patient not taking: Reported on 04/24/2020), Disp: 100 tablet, Rfl: 3 .  terazosin (HYTRIN) 5 MG capsule, TAKE 1 CAPSULE BY MOUTH  DAILY AT BEDTIME, Disp: 90 capsule, Rfl: 3   No Known Allergies   Men's preventive visit. Patient Health Questionnaire (PHQ-2) is    Clinical Support from 03/25/2020 in [facility]  PHQ-2 Total Score 0    . Patient is on a *** diet. Marital status: Married. Relevant history for alcohol use is:  Social History   Substance and Sexual Activity  Alcohol Use Yes   Comment: ocasional beer  . Relevant history for tobacco use is:  Social History   Tobacco Use  Smoking Status Former Smoker  .  Packs/day: 0.50  . Years: 35.00  . Pack years: 17.50  . Types: Cigarettes  . Quit date: 12/12/1997  . Years since quitting: 22.8  Smokeless Tobacco Never Used  Tobacco Comment   12 cigs daily  .   Review of Systems  Constitutional: Negative.   HENT: Negative.   Eyes: Negative.  Negative for blurred vision.  Respiratory: Negative.  Negative for shortness of breath.   Cardiovascular: Negative.  Negative for chest pain.  Gastrointestinal: Negative.   Endocrine: Negative.   Genitourinary: Negative.   Musculoskeletal: Negative.   Skin: Negative.   Allergic/Immunologic: Negative.   Neurological: Negative.   Hematological: Negative.    Psychiatric/Behavioral: Negative.      Today's Vitals   10/06/20 0921  BP: 134/72  Pulse: 77  Temp: 98.1 F (36.7 C)  TempSrc: Oral  Weight: 246 lb 6.4 oz (111.8 kg)  Height: 6\' 4"  (1.93 m)   Body mass index is 29.99 kg/m.  Wt Readings from Last 3 Encounters:  10/06/20 246 lb 6.4 oz (111.8 kg)  04/24/20 251 lb (113.9 kg)  03/25/20 250 lb (113.4 kg)   Objective:  Physical Exam Vitals and nursing note reviewed.  Constitutional:      Appearance: Normal appearance. He is obese.  HENT:     Head: Normocephalic and atraumatic.  Cardiovascular:     Rate and Rhythm: Normal rate and regular rhythm.     Heart sounds: Normal heart sounds.  Pulmonary:     Breath sounds: Normal breath sounds.  Skin:    General: Skin is warm.  Neurological:     General: No focal deficit present.     Mental Status: He is alert and oriented to person, place, and time.         Assessment And Plan:    1. Encounter for annual physical exam  2. Hypertensive nephropathy  3. Encounter for hepatitis C screening test for low risk patient     Patient was given opportunity to ask questions. Patient verbalized understanding of the plan and was able to repeat key elements of the plan. All questions were answered to their satisfaction.   Michelle Nasuti, [place], Michelle Nasuti, CMA, have reviewed all documentation for this visit. The documentation on 10/06/20 for the exam, diagnosis, procedures, and orders are all accurate and complete.  THE PATIENT IS ENCOURAGED TO PRACTICE SOCIAL DISTANCING DUE TO THE COVID-19 PANDEMIC.

## 2020-10-06 NOTE — Patient Instructions (Addendum)
[facility], Dr. Drue Second Evaluation for laser therapy  Ginger/turmeric   Health Maintenance, Male Adopting a healthy lifestyle and getting preventive care are important in promoting health and wellness. Ask your health care provider about:  The right schedule for you to have regular tests and exams.  Things you can do on your own to prevent diseases and keep yourself healthy. What should I know about diet, weight, and exercise? Eat a healthy diet   Eat a diet that includes plenty of vegetables, fruits, low-fat dairy products, and lean protein.  Do not eat a lot of foods that are high in solid fats, added sugars, or sodium. Maintain a healthy weight Body mass index (BMI) is a measurement that can be used to identify possible weight problems. It estimates body fat based on height and weight. Your health care provider can help determine your BMI and help you achieve or maintain a healthy weight. Get regular exercise Get regular exercise. This is one of the most important things you can do for your health. Most adults should:  Exercise for at least 150 minutes each week. The exercise should increase your heart rate and make you sweat (moderate-intensity exercise).  Do strengthening exercises at least twice a week. This is in addition to the moderate-intensity exercise.  Spend less time sitting. Even light physical activity can be beneficial. Watch cholesterol and blood lipids Have your blood tested for lipids and cholesterol at 78 years of age, then have this test every 5 years. You may need to have your cholesterol levels checked more often if:  Your lipid or cholesterol levels are high.  You are older than 78 years of age.  You are at high risk for heart disease. What should I know about cancer screening? Many types of cancers can be detected early and may often be prevented. Depending on your health history and family history, you may need to have cancer screening  at various ages. This may include screening for:  Colorectal cancer.  Prostate cancer.  Skin cancer.  Lung cancer. What should I know about heart disease, diabetes, and high blood pressure? Blood pressure and heart disease  High blood pressure causes heart disease and increases the risk of stroke. This is more likely to develop in people who have high blood pressure readings, are of African descent, or are overweight.  Talk with your health care provider about your target blood pressure readings.  Have your blood pressure checked: ? Every 3-5 years if you are 55-89 years of age. ? Every year if you are 78 years old or older.  If you are between the ages of 75 and 58 and are a current or former smoker, ask your health care provider if you should have a one-time screening for abdominal aortic aneurysm (AAA). Diabetes Have regular diabetes screenings. This checks your fasting blood sugar level. Have the screening done:  Once every three years after age 80 if you are at a normal weight and have a low risk for diabetes.  More often and at a younger age if you are overweight or have a high risk for diabetes. What should I know about preventing infection? Hepatitis B If you have a higher risk for hepatitis B, you should be screened for this virus. Talk with your health care provider to find out if you are at risk for hepatitis B infection. Hepatitis C Blood testing is recommended for:  Everyone born from [birth year] through 1964.  Anyone with known risk factors for  hepatitis C. Sexually transmitted infections (STIs)  You should be screened each year for STIs, including gonorrhea and chlamydia, if: ? You are sexually active and are younger than 78 years of age. ? You are older than 78 years of age and your health care provider tells you that you are at risk for this type of infection. ? Your sexual activity has changed since you were last screened, and you are at increased risk for  chlamydia or gonorrhea. Ask your health care provider if you are at risk.  Ask your health care provider about whether you are at high risk for HIV. Your health care provider may recommend a prescription medicine to help prevent HIV infection. If you choose to take medicine to prevent HIV, you should first get tested for HIV. You should then be tested every 3 months for as long as you are taking the medicine. Follow these instructions at home: Lifestyle  Do not use any products that contain nicotine or tobacco, such as cigarettes, e-cigarettes, and chewing tobacco. If you need help quitting, ask your health care provider.  Do not use street drugs.  Do not share needles.  Ask your health care provider for help if you need support or information about quitting drugs. Alcohol use  Do not drink alcohol if your health care provider tells you not to drink.  If you drink alcohol: ? Limit how much you have to 0-2 drinks a day. ? Be aware of how much alcohol is in your drink. In the [place] one drink equals one 12 oz bottle of beer (355 mL), one 5 oz glass of wine (148 mL), or one 1 oz glass of hard liquor (44 mL). General instructions  Schedule regular health, dental, and eye exams.  Stay current with your vaccines.  Tell your health care provider if: ? You often feel depressed. ? You have ever been abused or do not feel safe at home. Summary  Adopting a healthy lifestyle and getting preventive care are important in promoting health and wellness.  Follow your health care provider's instructions about healthy diet, exercising, and getting tested or screened for diseases.  Follow your health care provider's instructions on monitoring your cholesterol and blood pressure. This information is not intended to replace advice given to you by your health care provider. Make sure you discuss any questions you have with your health care provider. Document Revised: 11/21/2018 Document Reviewed:  11/21/2018 Elsevier Patient Education  2020 [employer].

## 2020-10-06 NOTE — Progress Notes (Signed)
I,Tianna Badgett,acting as a Education administrator for Maximino Greenland, MD.,have documented all relevant documentation on the behalf of Maximino Greenland, MD,as directed by  Maximino Greenland, MD while in the presence of Maximino Greenland, MD.  This visit occurred during the SARS-CoV-2 public health emergency.  Safety protocols were in place, including screening questions prior to the visit, additional usage of staff PPE, and extensive cleaning of exam room while observing appropriate contact time as indicated for disinfecting solutions.  Subjective:     Patient ID: Edward Gallagher , male    DOB: [birth year] , 79 y.o.   MRN: [id number]   Chief Complaint  Patient presents with  . Annual Exam  . Hypertension    HPI  He is here today for a full physical examination. He is followed by Urology for his prostate exams. He has no specific concerns at this time.He denies headaches, chest pain and visual disturbances. Unfortunately, he has been unable to exercise as much as he likes due to left hip pain. He has been evaluated by Ortho, takes Tylenol prn.   Hypertension This is a chronic problem. The current episode started more than 1 year ago. The problem has been gradually improving since onset. The problem is uncontrolled. Pertinent negatives include no blurred vision, chest pain or shortness of breath. Risk factors for coronary artery disease include male gender. Past treatments include lifestyle changes and diuretics. The current treatment provides moderate improvement.     Past Medical History:  Diagnosis Date  . A-fib ([place])   . Allergy   . Hyperlipidemia   . Hypertension   . Prostatic hypertrophy   . Sinus trouble   . Sleep apnea      Family History  Problem Relation Age of Onset  . Hypertension Other   . Prostate cancer Father   . Testicular cancer Brother   . Prostate cancer Brother   . Lung cancer Brother   . Cancer Brother        double mynoma  . Healthy Mother      Current Outpatient  Medications:  .  atorvastatin (LIPITOR) 40 MG tablet, Take 0.5 tablets (20 mg total) by mouth daily., Disp: 90 tablet, Rfl: 1 .  ELIQUIS 5 MG TABS tablet, TAKE 1 TABLET(5 MG) BY MOUTH TWICE DAILY, Disp: 180 tablet, Rfl: 3 .  finasteride (PROSCAR) 5 MG tablet, Take 5 mg by mouth daily., Disp: , Rfl:  .  fluticasone (FLONASE) 50 MCG/ACT nasal spray, Place 1 spray into both nostrils daily., Disp: , Rfl:  .  hydrochlorothiazide (MICROZIDE) 12.5 MG capsule, TAKE 1 CAPSULE BY MOUTH  DAILY, Disp: 90 capsule, Rfl: 3 .  Krill Oil 1000 MG CAPS, Take 1,000 mg by mouth daily., Disp: , Rfl:  .  Multiple Vitamin (MULTIVITAMIN) tablet, Take 1 tablet by mouth daily., Disp: , Rfl:  .  nitroGLYCERIN (NITROSTAT) 0.4 MG SL tablet, Place 1 tablet (0.4 mg total) under the tongue every 5 (five) minutes as needed for chest pain. (Patient not taking: Reported on 04/24/2020), Disp: 100 tablet, Rfl: 3 .  terazosin (HYTRIN) 5 MG capsule, TAKE 1 CAPSULE BY MOUTH  DAILY AT BEDTIME, Disp: 90 capsule, Rfl: 3   No Known Allergies   Men's preventive visit. Patient Health Questionnaire (PHQ-2) is    Clinical Support from 03/25/2020 in [facility]  PHQ-2 Total Score 0    . Patient is on a healthy diet. Marital status: Married. Relevant history for alcohol use is:  Social History  Substance and Sexual Activity  Alcohol Use Yes   Comment: ocasional beer  . Relevant history for tobacco use is:  Social History   Tobacco Use  Smoking Status Former Smoker  . Packs/day: 0.50  . Years: 35.00  . Pack years: 17.50  . Types: Cigarettes  . Quit date: 12/12/1997  . Years since quitting: 22.8  Smokeless Tobacco Never Used  Tobacco Comment   12 cigs daily  .   Review of Systems  Constitutional: Negative.   HENT: Positive for postnasal drip.   Eyes: Negative.  Negative for blurred vision.  Respiratory: Negative.  Negative for shortness of breath.   Cardiovascular: Negative.  Negative for chest pain.   Gastrointestinal: Negative.   Endocrine: Negative.   Genitourinary: Negative.   Musculoskeletal: Positive for arthralgias.  Skin: Negative.   Allergic/Immunologic: Negative.   Neurological: Negative.   Hematological: Negative.   Psychiatric/Behavioral: Negative.      Today's Vitals   10/06/20 0921  BP: 134/72  Pulse: 77  Temp: 98.1 F (36.7 C)  TempSrc: Oral  Weight: 246 lb 6.4 oz (111.8 kg)  Height: '6\' 4"'  (1.93 m)   Body mass index is 29.99 kg/m.  Wt Readings from Last 3 Encounters:  10/06/20 246 lb 6.4 oz (111.8 kg)  04/24/20 251 lb (113.9 kg)  03/25/20 250 lb (113.4 kg)     Objective:  Physical Exam Vitals and nursing note reviewed.  Constitutional:      Appearance: Normal appearance.  HENT:     Head: Normocephalic and atraumatic.     Right Ear: Tympanic membrane, ear canal and external ear normal.     Left Ear: Tympanic membrane, ear canal and external ear normal.     Nose:     Comments: Deferred, masked    Mouth/Throat:     Comments: Deferred, masked Eyes:     Extraocular Movements: Extraocular movements intact.     Conjunctiva/sclera: Conjunctivae normal.     Pupils: Pupils are equal, round, and reactive to light.  Cardiovascular:     Rate and Rhythm: Normal rate and regular rhythm.     Pulses: Normal pulses.          Dorsalis pedis pulses are 2+ on the right side and 2+ on the left side.     Heart sounds: Normal heart sounds.  Pulmonary:     Effort: Pulmonary effort is normal.     Breath sounds: Normal breath sounds.  Chest:     Breasts:        Right: Normal. No swelling, bleeding, inverted nipple, mass or nipple discharge.        Left: Normal. No swelling, bleeding, inverted nipple, mass or nipple discharge.  Abdominal:     General: Bowel sounds are normal.     Palpations: Abdomen is soft.  Genitourinary:    Comments: Deferred Musculoskeletal:        General: Normal range of motion.     Cervical back: Normal range of motion and neck supple.   Skin:    General: Skin is warm.  Neurological:     General: No focal deficit present.     Mental Status: He is alert.  Psychiatric:        Mood and Affect: Mood normal.        Behavior: Behavior normal.         Assessment And Plan:    1. Encounter for annual physical exam Comments: A full exam was performed, DRE deferred. PATIENT IS ADVISED TO GET  30-45 MINUTES REGULAR EXERCISE NO LESS THAN FOUR TO FIVE DAYS PER WEEK - BOTH WEIGHTBEARING EXERCISES AND AEROBIC ARE RECOMMENDED.  PATIENT IS ADVISED TO FOLLOW A HEALTHY DIET WITH AT LEAST SIX FRUITS/VEGGIES PER DAY, DECREASE INTAKE OF RED MEAT, AND TO INCREASE FISH INTAKE TO TWO DAYS PER WEEK.  MEATS/FISH SHOULD NOT BE FRIED, BAKED OR BROILED IS PREFERABLE.  I SUGGEST WEARING SPF 50 SUNSCREEN ON EXPOSED PARTS AND ESPECIALLY WHEN IN THE DIRECT SUNLIGHT FOR AN EXTENDED PERIOD OF TIME.  PLEASE AVOID FAST FOOD RESTAURANTS AND INCREASE YOUR WATER INTAKE.   2. Hypertensive nephropathy Comments: Chronic, fair control.  He will continue with current meds for now. EKG not performed, he had one performed earlier this year. Previous ekg reviewed in full detail, NSR w/o acute changes. HE will rto in six months for re-evaluation.  - CBC - CMP14+EGFR - Lipid panel - POCT UA - Microalbumin - POCT Urinalysis Dipstick (81002)  3. Chronic renal disease, stage II Comments: Chronic, this has been stable. I will recheck GFR, Cr today. Encouraged to stay well hydrated and keep bp under optimal control to prevent progression.  4. Primary osteoarthritis of left hip Comments: Chronic, followed by Ortho. Currently taking Tylenol prn. He was advised to consider incorporating ginger/turmeric tea into his weekly routine.   5. Ectatic abdominal aorta (HCC) Comments: Previous u/s results reviewed in full detail. He is at risk for aneurysm development.  I will schedule him for f/u abdominal aorta ultrasound.  6. Other abnormal glucose Comments: His a1c has been  elevated in the past. I will recheck a1c, bmp today. Encouraged to limit intake of sugary beverages.  - Hemoglobin A1c  7. Vitamin D deficiency disease Comments: I will check vitamin D level and supplement as needed.  - VITAMIN D 25 Hydroxy (Vit-D Deficiency, Fractures)  8. Encounter for hepatitis C screening test for low risk patient Comments: I will check hepatitis c antibody. - Hepatitis C antibody      Patient was given opportunity to ask questions. Patient verbalized understanding of the plan and was able to repeat key elements of the plan. All questions were answered to their satisfaction.   Maximino Greenland, MD   I, Maximino Greenland, MD, have reviewed all documentation for this visit. The documentation on 10/18/20 for the exam, diagnosis, procedures, and orders are all accurate and complete.  THE PATIENT IS ENCOURAGED TO PRACTICE SOCIAL DISTANCING DUE TO THE COVID-19 PANDEMIC.

## 2020-10-07 LAB — CBC
Hematocrit: 32.7 % — ABNORMAL LOW (ref 37.5–51.0)
Hemoglobin: 10.2 g/dL — ABNORMAL LOW (ref 13.0–17.7)
MCH: 24.9 pg — ABNORMAL LOW (ref 26.6–33.0)
MCHC: 31.2 g/dL — ABNORMAL LOW (ref 31.5–35.7)
MCV: 80 fL (ref 79–97)
Platelets: 242 10*3/uL (ref 150–450)
RBC: 4.09 x10E6/uL — ABNORMAL LOW (ref 4.14–5.80)
RDW: 15.2 % (ref 11.6–15.4)
WBC: 3.3 10*3/uL — ABNORMAL LOW (ref 3.4–10.8)

## 2020-10-07 LAB — CMP14+EGFR
ALT: 22 IU/L (ref 0–44)
AST: 20 IU/L (ref 0–40)
Albumin/Globulin Ratio: 1.6 (ref 1.2–2.2)
Albumin: 4.6 g/dL (ref 3.7–4.7)
Alkaline Phosphatase: 86 IU/L (ref 44–121)
BUN/Creatinine Ratio: 13 (ref 10–24)
BUN: 17 mg/dL (ref 8–27)
Bilirubin Total: 0.7 mg/dL (ref 0.0–1.2)
CO2: 26 mmol/L (ref 20–29)
Calcium: 10.1 mg/dL (ref 8.6–10.2)
Chloride: 102 mmol/L (ref 96–106)
Creatinine, Ser: 1.28 mg/dL — ABNORMAL HIGH (ref 0.76–1.27)
GFR calc Af Amer: 62 mL/min/{1.73_m2} (ref >59)
GFR calc non Af Amer: 53 mL/min/{1.73_m2} — ABNORMAL LOW (ref >59)
Globulin, Total: 2.9 g/dL (ref 1.5–4.5)
Glucose: 101 mg/dL — ABNORMAL HIGH (ref 65–99)
Potassium: 4.3 mmol/L (ref 3.5–5.2)
Sodium: 142 mmol/L (ref 134–144)
Total Protein: 7.5 g/dL (ref 6.0–8.5)

## 2020-10-07 LAB — LIPID PANEL
Chol/HDL Ratio: 2.9 ratio (ref 0.0–5.0)
Cholesterol, Total: 154 mg/dL (ref 100–199)
HDL: 53 mg/dL (ref >39)
LDL Chol Calc (NIH): 85 mg/dL (ref 0–99)
Triglycerides: 86 mg/dL (ref 0–149)
VLDL Cholesterol Cal: 16 mg/dL (ref 5–40)

## 2020-10-07 LAB — HEMOGLOBIN A1C
Est. average glucose Bld gHb Est-mCnc: 126 mg/dL
Hgb A1c MFr Bld: 6 % — ABNORMAL HIGH (ref 4.8–5.6)

## 2020-10-07 LAB — HEPATITIS C ANTIBODY: Hep C Virus Ab: <0.1 s/co ratio (ref 0.0–0.9)

## 2020-10-07 LAB — VITAMIN D 25 HYDROXY (VIT D DEFICIENCY, FRACTURES): Vit D, 25-Hydroxy: 62.9 ng/mL (ref 30.0–100.0)

## 2020-10-22 ENCOUNTER — Encounter: Payer: Self-pay | Admitting: Internal Medicine

## 2020-10-29 ENCOUNTER — Ambulatory Visit: Payer: Medicare Other | Admitting: Cardiology

## 2020-11-03 ENCOUNTER — Ambulatory Visit (INDEPENDENT_AMBULATORY_CARE_PROVIDER_SITE_OTHER): Payer: Medicare Other

## 2020-11-03 ENCOUNTER — Other Ambulatory Visit: Payer: Self-pay

## 2020-11-03 VITALS — BP 138/82 | HR 87 | Temp 98.1°F | Ht 76.0 in | Wt 244.0 lb

## 2020-11-03 DIAGNOSIS — R809 Proteinuria, unspecified: Secondary | ICD-10-CM

## 2020-11-03 LAB — POCT URINALYSIS DIPSTICK
Bilirubin, UA: NEGATIVE
Blood, UA: NEGATIVE
Glucose, UA: NEGATIVE
Ketones, UA: NEGATIVE
Leukocytes, UA: NEGATIVE
Nitrite, UA: NEGATIVE
Protein, UA: POSITIVE — AB
Spec Grav, UA: >=1.03 — AB (ref 1.010–1.025)
Urobilinogen, UA: 0.2 E.U./dL
pH, UA: 7.5 (ref 5.0–8.0)

## 2020-11-03 NOTE — Progress Notes (Signed)
Pt here today for a repeat urine test

## 2020-11-04 ENCOUNTER — Ambulatory Visit: Payer: Medicare Other | Admitting: Cardiology

## 2020-11-17 NOTE — Progress Notes (Signed)
Follow up visit  Subjective:   Edward Gallagher, male    DOB: [birth year], 79 y.o.   MRN: [id number]   HPI  79 y.o. African American male with hypertension, hyperlipidemia, OSA, paroxysmal Afib  Hb down from 13 in 03/2020 to 10 in 09/2020. Patient denies any melena. He reportedly underwent colonoscopy by Dr. Benson Norway in 04/2020, after a heme positive stool, that reportedly did not show any significant abnormalities. He denies chest pain, shortness of breath, palpitations, leg edema, orthopnea, PND, TIA/syncope.    Current Outpatient Medications on File Prior to Visit  Medication Sig Dispense Refill  . atorvastatin (LIPITOR) 40 MG tablet Take 0.5 tablets (20 mg total) by mouth daily. 90 tablet 1  . ELIQUIS 5 MG TABS tablet TAKE 1 TABLET(5 MG) BY MOUTH TWICE DAILY 180 tablet 3  . finasteride (PROSCAR) 5 MG tablet Take 5 mg by mouth daily.    . fluticasone (FLONASE) 50 MCG/ACT nasal spray Place 1 spray into both nostrils daily.    . hydrochlorothiazide (MICROZIDE) 12.5 MG capsule TAKE 1 CAPSULE BY MOUTH  DAILY 90 capsule 3  . Krill Oil 1000 MG CAPS Take 1,000 mg by mouth daily.    . Multiple Vitamin (MULTIVITAMIN) tablet Take 1 tablet by mouth daily.    . nitroGLYCERIN (NITROSTAT) 0.4 MG SL tablet Place 1 tablet (0.4 mg total) under the tongue every 5 (five) minutes as needed for chest pain. (Patient not taking: Reported on 04/24/2020) 100 tablet 3  . terazosin (HYTRIN) 5 MG capsule TAKE 1 CAPSULE BY MOUTH  DAILY AT BEDTIME 90 capsule 3   No current facility-administered medications on file prior to visit.    Cardiovascular & other pertient studies:  EKG 11/18/2020: Sinus rhythm 71 bpm First degree A-V block Occasional PAC     Calcium score 01/2020: - Total Calcium Score: 38. -- Left Main: 29. -- Right Coronary: 0 -- Left Anterior Descending: 9. -- Circumflex: 0.  Lexiscan (Walking with mod Bruce)Tetrofosmin Stress Test  12/25/2019: Nondiagnostic ECG stress. Normal myocardial  perfusion. Stress LV EF is mildly depressed at 42% with global hypokinesis.  No previous exam available for comparison. 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 Correlate with echocardiogram. Intermediate risk study due to low LVEF.   Abdominal Aortic Duplex 01/21/2020:  The maximum aorta (sac) diameter is 2.35 cm (prox). Diffuse calcific  plaque observed in the proximal, mid and distal aorta. Normal flow  velocities noted.  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  Normal flow velocity in the aorta and bilateral IIA.  Consider rescan in 10 years.   Carotid artery duplex  11/15/2019: Minimal stenosis in the right internal carotid artery (minimal). Minimal stenosis in the left internal carotid artery (minimal). Mild soft plaque bilateral carotid arteries. Antegrade right vertebral artery flow. Antegrade left vertebral artery flow.  Echocardiogram 11/15/2019: Left ventricle cavity is normal in size. Moderate concentric hypertrophy of the left ventricle. Normal LV systolic function with visual EF 50-55%. Normal global wall motion. Normal diastolic filling pattern. Trileaflet aortic valve.  Mild to moderate aortic regurgitation. Moderate (Grade II) mitral regurgitation. No evidence of pulmonary hypertension.  Event monitor 11/02/2019 - 12/01/2019: Diagnostic time: 100%  Dominant rhythm: Sinus. HR 36-136 bpm. Avg HR 64 bpm. Episodes of atrial fibrillation with RVR noted. Afib burden <1% NSVT up to 4 beats noted.  Sinus bradycardia with lowest HR 36 bpm, typically occurs during sleep hours.  Occasional PAC/PVC seen. No atrial flutter/SVT/high grade AV  block, sinus pause >3sec noted. Symptoms reported: None  Recent labs: 10/06/2020: Glucose 101, BUN/Cr 17/1.28. EGFR 62. Na/K 142/4.3. Rest of the CMP normal H/H 10/32. MCV 80. Platelets 242 Chol 154, TG 86, HDL 53, LDL 85  12/16/2019: Glucose 106, BUN/Cr 16/1.2. EGFR >60. Na/K 138/3.8.  Rest of the CMP normal H/H 11.5/35.9. MCV 77.9. Platelets 214 HbA1C 6.0% Troponin I 6  02/2019: Chol 157, TG 75, HDL 52, LDL 91   Review of Systems  Cardiovascular: Positive for chest pain (One episode of chest pressure at rest). Negative for dyspnea on exertion, leg swelling, palpitations and syncope.         Vitals:   11/18/20 0941  BP: 139/68  Pulse: 71  Resp: 16  SpO2: 96%      Objective:   Physical Exam Vitals and nursing note reviewed.  Constitutional:      Appearance: He is well-developed.  Neck:     Vascular: No JVD.  Cardiovascular:     Rate and Rhythm: Normal rate and regular rhythm.     Pulses: Intact distal pulses.     Heart sounds: Normal heart sounds. No murmur heard.   Pulmonary:     Effort: Pulmonary effort is normal.     Breath sounds: Normal breath sounds. No wheezing or rales.         Assessment & Recommendations:   79 y.o. African American male with hypertension, hyperlipidemia, OSA, paroxysmal Afib, chest pain.  Paroxysmal Afib: Afib burden <1%. Resting HR during sleep in 30s. Given symptoms are infrequent, would avoid scheduled AV nodal blocking agent or antiarrythmic therapy. No ischemic on stress test. Calcium score 29. Emphasized the use of CPAP.  CHA2DS2VASc score 3, annual stroke risk 3.2%. Hb down from 13 in 03/2020 to 10 in 09/2020. Check CBC. If Hb still low, may need further workup.  Elevated calcium score: No significant angina symptoms at this time.  Continue risk factor modification, including statin.  Due to ongoing use of Eliquis, do not recommend aspirin.  Aorta ectasia: Mild  F/u in 6 months   Alesha Jaffee Esther Hardy, MD [facility] Pager: [phone] Office: [phone]

## 2020-11-18 ENCOUNTER — Other Ambulatory Visit: Payer: Self-pay

## 2020-11-18 ENCOUNTER — Ambulatory Visit: Payer: Medicare Other | Admitting: Cardiology

## 2020-11-18 ENCOUNTER — Encounter: Payer: Self-pay | Admitting: Cardiology

## 2020-11-18 VITALS — BP 139/68 | HR 71 | Resp 16 | Ht 76.0 in | Wt 244.0 lb

## 2020-11-18 DIAGNOSIS — I480 Paroxysmal atrial fibrillation: Secondary | ICD-10-CM

## 2020-11-18 DIAGNOSIS — D649 Anemia, unspecified: Secondary | ICD-10-CM | POA: Diagnosis not present

## 2020-11-18 DIAGNOSIS — R195 Other fecal abnormalities: Secondary | ICD-10-CM | POA: Diagnosis not present

## 2020-11-18 DIAGNOSIS — R931 Abnormal findings on diagnostic imaging of heart and coronary circulation: Secondary | ICD-10-CM

## 2020-11-19 LAB — CBC
Hematocrit: 32.3 % — ABNORMAL LOW (ref 37.5–51.0)
Hemoglobin: 10.3 g/dL — ABNORMAL LOW (ref 13.0–17.7)
MCH: 24.8 pg — ABNORMAL LOW (ref 26.6–33.0)
MCHC: 31.9 g/dL (ref 31.5–35.7)
MCV: 78 fL — ABNORMAL LOW (ref 79–97)
Platelets: 243 10*3/uL (ref 150–450)
RBC: 4.16 x10E6/uL (ref 4.14–5.80)
RDW: 14.8 % (ref 11.6–15.4)
WBC: 2.8 10*3/uL — ABNORMAL LOW (ref 3.4–10.8)

## 2020-12-17 ENCOUNTER — Ambulatory Visit: Payer: Medicare Other | Admitting: Internal Medicine

## 2020-12-17 ENCOUNTER — Encounter: Payer: Self-pay | Admitting: Nurse Practitioner

## 2020-12-17 ENCOUNTER — Ambulatory Visit (INDEPENDENT_AMBULATORY_CARE_PROVIDER_SITE_OTHER): Payer: Medicare Other | Admitting: Nurse Practitioner

## 2020-12-17 ENCOUNTER — Other Ambulatory Visit: Payer: Self-pay

## 2020-12-17 VITALS — BP 126/62 | HR 43 | Temp 98.1°F | Ht 73.0 in | Wt 243.6 lb

## 2020-12-17 DIAGNOSIS — Z79899 Other long term (current) drug therapy: Secondary | ICD-10-CM | POA: Diagnosis not present

## 2020-12-17 DIAGNOSIS — D649 Anemia, unspecified: Secondary | ICD-10-CM | POA: Diagnosis not present

## 2020-12-17 DIAGNOSIS — M79604 Pain in right leg: Secondary | ICD-10-CM

## 2020-12-17 DIAGNOSIS — M62838 Other muscle spasm: Secondary | ICD-10-CM

## 2020-12-17 MED ORDER — TIZANIDINE HCL 2 MG PO TABS: 2.0000 mg | ORAL_TABLET | Freq: Three times a day (TID) | ORAL | 0 refills | Status: DC | PRN

## 2020-12-17 NOTE — Patient Instructions (Signed)
Leg Cramps Leg cramps occur when one or more muscles tighten and you have no control over this tightening (involuntary muscle contraction). Muscle cramps can develop in any muscle, but the most common place is in the calf muscles of the leg. Those cramps can occur during exercise or when you are at rest. Leg cramps are painful, and they may last for a few seconds to a few minutes. Cramps may return several times before they finally stop. Usually, leg cramps are not caused by a serious medical problem. In many cases, the cause is not known. Some common causes include:  Excessive physical effort (overexertion), such as during intense exercise.  Overuse from repetitive motions, or doing the same thing over and over.  Staying in a certain position for a long period of time.  Improper preparation, form, or technique while performing a sport or an activity.  Dehydration.  Injury.  Side effects of certain medicines.  Abnormally low levels of minerals in your blood (electrolytes), especially potassium and calcium. This could result from: ? Pregnancy. ? Taking diuretic medicines. Follow these instructions at home: Eating and drinking  Drink enough fluid to keep your urine pale yellow. Staying hydrated may help prevent cramps.  Eat a healthy diet that includes plenty of nutrients to help your muscles function. A healthy diet includes fruits and vegetables, lean protein, whole grains, and low-fat or nonfat dairy products. Managing pain, stiffness, and swelling      Try massaging, stretching, and relaxing the affected muscle. Do this for several minutes at a time.  If directed, put ice on areas that are sore or painful after a cramp: ? Put ice in a plastic bag. ? Place a towel between your skin and the bag. ? Leave the ice on for 20 minutes, 2-3 times a day.  If directed, apply heat to muscles that are tense or tight. Do this before you exercise, or as often as told by your health care  provider. Use the heat source that your health care provider recommends, such as a moist heat pack or a heating pad. ? Place a towel between your skin and the heat source. ? Leave the heat on for 20-30 minutes. ? Remove the heat if your skin turns bright red. This is especially important if you are unable to feel pain, heat, or cold. You may have a greater risk of getting burned.  Try taking hot showers or baths to help relax tight muscles. General instructions  If you are having frequent leg cramps, avoid intense exercise for several days.  Take over-the-counter and prescription medicines only as told by your health care provider.  Keep all follow-up visits as told by your health care provider. This is important. Contact a health care provider if:  Your leg cramps get more severe or more frequent, or they do not improve over time.  Your foot becomes cold, numb, or blue. Summary  Muscle cramps can develop in any muscle, but the most common place is in the calf muscles of the leg.  Leg cramps are painful, and they may last for a few seconds to a few minutes.  Usually, leg cramps are not caused by a serious medical problem. Often, the cause is not known.  Stay hydrated and take over-the-counter and prescription medicines only as told by your health care provider. This information is not intended to replace advice given to you by your health care provider. Make sure you discuss any questions you have with your health care  provider. Document Revised: 11/10/2017 Document Reviewed: 09/07/2017 Elsevier Patient Education  2020 Elsevier Inc.  

## 2020-12-17 NOTE — Progress Notes (Signed)
I,Tianna Badgett,acting as a Education administrator for [employer], NP.,have documented all relevant documentation on the behalf of [employer], NP,as directed by  Bary Castilla, NP while in the presence of Bary Castilla, NP.  This visit occurred during the SARS-CoV-2 public health emergency.  Safety protocols were in place, including screening questions prior to the visit, additional usage of staff PPE, and extensive cleaning of exam room while observing appropriate contact time as indicated for disinfecting solutions.  Subjective:     Patient ID: Edward Gallagher , male    DOB: [birth year] , 80 y.o.   MRN: [id number]   Chief Complaint  Patient presents with  . Leg Pain    HPI  The patient is here for leg pain for the past 4 months in September. It feels like a leg cramp on his right leg. It happens occasionally. It happens sporadically. He has to rub his leg to get the spasm out. He already takes magnesium 500 mg daily.   Leg Pain  The incident occurred more than 1 week ago. There was no injury mechanism. The pain is present in the right leg. The quality of the pain is described as cramping. The pain is moderate. The pain has been intermittent since onset. Pertinent negatives include no loss of sensation, numbness or tingling. He has tried rest for the symptoms.     Past Medical History:  Diagnosis Date  . A-fib ([place])   . Allergy   . Hyperlipidemia   . Hypertension   . Prostatic hypertrophy   . Sinus trouble   . Sleep apnea      Family History  Problem Relation Age of Onset  . Hypertension Other   . Prostate cancer Father   . Testicular cancer Brother   . Prostate cancer Brother   . Lung cancer Brother   . Cancer Brother        double mynoma  . Healthy Mother      Current Outpatient Medications:  .  tiZANidine (ZANAFLEX) 2 MG tablet, Take 1 tablet (2 mg total) by mouth every 8 (eight) hours as needed for muscle spasms., Disp: 30 tablet, Rfl: 0 .  atorvastatin  (LIPITOR) 40 MG tablet, Take 0.5 tablets (20 mg total) by mouth daily. (Patient taking differently: Take 40 mg by mouth daily. ), Disp: 90 tablet, Rfl: 1 .  ELIQUIS 5 MG TABS tablet, TAKE 1 TABLET(5 MG) BY MOUTH TWICE DAILY, Disp: 180 tablet, Rfl: 3 .  finasteride (PROSCAR) 5 MG tablet, Take 5 mg by mouth daily., Disp: , Rfl:  .  fluticasone (FLONASE) 50 MCG/ACT nasal spray, Place 1 spray into both nostrils daily., Disp: , Rfl:  .  hydrochlorothiazide (MICROZIDE) 12.5 MG capsule, TAKE 1 CAPSULE BY MOUTH  DAILY, Disp: 90 capsule, Rfl: 3 .  Krill Oil 1000 MG CAPS, Take 1,000 mg by mouth daily., Disp: , Rfl:  .  Multiple Vitamin (MULTIVITAMIN) tablet, Take 1 tablet by mouth daily., Disp: , Rfl:  .  nitroGLYCERIN (NITROSTAT) 0.4 MG SL tablet, Place 1 tablet (0.4 mg total) under the tongue every 5 (five) minutes as needed for chest pain., Disp: 100 tablet, Rfl: 3 .  terazosin (HYTRIN) 5 MG capsule, TAKE 1 CAPSULE BY MOUTH  DAILY AT BEDTIME, Disp: 90 capsule, Rfl: 3   No Known Allergies   Review of Systems  Constitutional: Negative.   Respiratory: Negative.   Cardiovascular: Negative.  Negative for chest pain and leg swelling.  Gastrointestinal: Negative.   Musculoskeletal: Positive for myalgias.  Leg muscle spasms to the right leg.    Neurological: Negative.  Negative for tingling, weakness and numbness.     Today's Vitals   12/17/20 0851  BP: 126/62  Pulse: (!) 43  Temp: 98.1 F (36.7 C)  TempSrc: Oral  Weight: 243 lb 9.6 oz (110.5 kg)  Height: 6\' 1"  (1.854 m)   Body mass index is 32.14 kg/m.  Wt Readings from Last 3 Encounters:  12/17/20 243 lb 9.6 oz (110.5 kg)  11/18/20 244 lb (110.7 kg)  11/03/20 244 lb (110.7 kg)    Objective:  Physical Exam Constitutional:      Appearance: Normal appearance.  HENT:     Head: Normocephalic and atraumatic.  Cardiovascular:     Rate and Rhythm: Normal rate and regular rhythm.     Pulses: Normal pulses.     Heart sounds: Normal  heart sounds.  Pulmonary:     Effort: Pulmonary effort is normal. No respiratory distress.     Breath sounds: Normal breath sounds. No wheezing.  Musculoskeletal:        General: No swelling or tenderness.  Skin:    General: Skin is warm and dry.  Neurological:     Mental Status: He is alert.         Assessment And Plan:     1. Leg pain, anterior, right - Vitamin B12 - Magnesium - tiZANidine (ZANAFLEX) 2 MG tablet; Take 1 tablet (2 mg total) by mouth every 8 (eight) hours as needed for muscle spasms.  Dispense: 30 tablet; Refill: 0 -Advised patient to only use medicine if needed due to his age, comorbidities. Advised patient of the side-effects of med. Patient will monitor BP and HR.  Patient verbalized understanding.  -Educated patient on stretching his leg muscles.  -Educated patient on taking magnesium PO OTC Daily     2. Muscle spasm of right lower extremity -Advised patient to stretch, participate in physical activity to loosen his muscles.  -Advised patient on taking magnesium PO OTC daily  -Will check blood levels for deficiency.  - Vitamin B12 - Magnesium - tiZANidine (ZANAFLEX) 2 MG tablet; Take 1 tablet (2 mg total) by mouth every 8 (eight) hours as needed for muscle spasms.  Dispense: 30 tablet; Refill: 0 -Advised patient to only use medicine if needed due to his age, comorbidities. Advised patient of the side-effects of med. Patient will monitor BP and HR. Patient verbalized understanding and will call if he has any questions.   Patient was given opportunity to ask questions. Patient verbalized understanding of the plan and was able to repeat key elements of the plan. All questions were answered to their satisfaction.  11/05/20, NP   I, Charlesetta Ivory, NP, have reviewed all documentation for this visit. The documentation on 12/17/20 for the exam, diagnosis, procedures, and orders are all accurate and complete.  THE PATIENT IS ENCOURAGED TO PRACTICE SOCIAL  DISTANCING DUE TO THE COVID-19 PANDEMIC.

## 2020-12-18 LAB — VITAMIN B12: Vitamin B-12: 1169 pg/mL (ref 232–1245)

## 2020-12-18 LAB — MAGNESIUM: Magnesium: 2 mg/dL (ref 1.6–2.3)

## 2021-01-20 ENCOUNTER — Encounter: Payer: Self-pay | Admitting: Internal Medicine

## 2021-01-25 ENCOUNTER — Other Ambulatory Visit: Payer: Self-pay | Admitting: Internal Medicine

## 2021-01-27 ENCOUNTER — Ambulatory Visit (INDEPENDENT_AMBULATORY_CARE_PROVIDER_SITE_OTHER): Payer: Medicare Other | Admitting: Internal Medicine

## 2021-01-27 ENCOUNTER — Other Ambulatory Visit: Payer: Self-pay

## 2021-01-27 ENCOUNTER — Encounter: Payer: Self-pay | Admitting: Internal Medicine

## 2021-01-27 VITALS — BP 124/66 | HR 96 | Temp 98.4°F | Ht 73.0 in | Wt 241.4 lb

## 2021-01-27 DIAGNOSIS — I129 Hypertensive chronic kidney disease with stage 1 through stage 4 chronic kidney disease, or unspecified chronic kidney disease: Secondary | ICD-10-CM | POA: Diagnosis not present

## 2021-01-27 DIAGNOSIS — N182 Chronic kidney disease, stage 2 (mild): Secondary | ICD-10-CM

## 2021-01-27 DIAGNOSIS — M25552 Pain in left hip: Secondary | ICD-10-CM | POA: Diagnosis not present

## 2021-01-27 NOTE — Patient Instructions (Signed)
Hip Pain The hip is the joint between the upper legs and the lower pelvis. The bones, cartilage, tendons, and muscles of your hip joint support your body and allow you to move around. Hip pain can range from a minor ache to severe pain in one or both of your hips. The pain may be felt on the inside of the hip joint near the groin, or on the outside near the buttocks and upper thigh. You may also have swelling or stiffness in your hip area. Follow these instructions at home: Managing pain, stiffness, and swelling  If directed, put ice on the painful area. To do this: ? Put ice in a plastic bag. ? Place a towel between your skin and the bag. ? Leave the ice on for 20 minutes, 2-3 times a day.  If directed, apply heat to the affected area as often as told by your health care provider. Use the heat source that your health care provider recommends, such as a moist heat pack or a heating pad. ? Place a towel between your skin and the heat source. ? Leave the heat on for 20-30 minutes. ? Remove the heat if your skin turns bright red. This is especially important if you are unable to feel pain, heat, or cold. You may have a greater risk of getting burned.      Activity  Do exercises as told by your health care provider.  Avoid activities that cause pain. General instructions  Take over-the-counter and prescription medicines only as told by your health care provider.  Keep a journal of your symptoms. Write down: ? How often you have hip pain. ? The location of your pain. ? What the pain feels like. ? What makes the pain worse.  Sleep with a pillow between your legs on your most comfortable side.  Keep all follow-up visits as told by your health care provider. This is important.   Contact a health care provider if:  You cannot put weight on your leg.  Your pain or swelling continues or gets worse after one week.  It gets harder to walk.  You have a fever. Get help right away  if:  You fall.  You have a sudden increase in pain and swelling in your hip.  Your hip is red or swollen or very tender to touch. Summary  Hip pain can range from a minor ache to severe pain in one or both of your hips.  The pain may be felt on the inside of the hip joint near the groin, or on the outside near the buttocks and upper thigh.  Avoid activities that cause pain.  Write down how often you have hip pain, the location of the pain, what makes it worse, and what it feels like. This information is not intended to replace advice given to you by your health care provider. Make sure you discuss any questions you have with your health care provider. Document Revised: 04/15/2019 Document Reviewed: 04/15/2019 Elsevier Patient Education  2021 Elsevier Inc.  

## 2021-01-27 NOTE — Progress Notes (Signed)
I,Katawbba Wiggins,acting as a Education administrator for Maximino Greenland, MD.,have documented all relevant documentation on the behalf of Maximino Greenland, MD,as directed by  Maximino Greenland, MD while in the presence of Maximino Greenland, MD.  This visit occurred during the SARS-CoV-2 public health emergency.  Safety protocols were in place, including screening questions prior to the visit, additional usage of staff PPE, and extensive cleaning of exam room while observing appropriate contact time as indicated for disinfecting solutions.  Subjective:     Patient ID: Edward Gallagher , male    DOB: 06-Sep-1941 , 80 y.o.   MRN: [id number]   Chief Complaint  Patient presents with  . Arthritis    HPI  The patient is in for evaluation to have a rheumatology referral.  He c/o worsening hip pain. There is pain with ambulation. Initially evaluated by Ortho in April 2021. He was treated as musculoskeletal pain and advised to return to office should his sx persist. He has sought chiropractic care/laser therapy to try to relieve his sx. Unfortunately, his sx have persisted. He is now ambulatory with a cane.   Arthritis Presents for follow-up visit. He complains of pain.     Past Medical History:  Diagnosis Date  . A-fib ([place])   . Allergy   . Hyperlipidemia   . Hypertension   . Prostatic hypertrophy   . Sinus trouble   . Sleep apnea      Family History  Problem Relation Age of Onset  . Hypertension Other   . Prostate cancer Father   . Testicular cancer Brother   . Prostate cancer Brother   . Lung cancer Brother   . Cancer Brother        double mynoma  . Healthy Mother      Current Outpatient Medications:  .  atorvastatin (LIPITOR) 40 MG tablet, Take 0.5 tablets (20 mg total) by mouth daily. (Patient taking differently: Take 40 mg by mouth daily. ), Disp: 90 tablet, Rfl: 1 .  ELIQUIS 5 MG TABS tablet, TAKE 1 TABLET(5 MG) BY MOUTH TWICE DAILY, Disp: 180 tablet, Rfl: 3 .  finasteride (PROSCAR) 5 MG  tablet, Take 5 mg by mouth daily., Disp: , Rfl:  .  hydrochlorothiazide (MICROZIDE) 12.5 MG capsule, TAKE 1 CAPSULE BY MOUTH  DAILY, Disp: 90 capsule, Rfl: 3 .  Krill Oil 1000 MG CAPS, Take 1,000 mg by mouth daily., Disp: , Rfl:  .  Multiple Vitamin (MULTIVITAMIN) tablet, Take 1 tablet by mouth daily., Disp: , Rfl:  .  nitroGLYCERIN (NITROSTAT) 0.4 MG SL tablet, Place 1 tablet (0.4 mg total) under the tongue every 5 (five) minutes as needed for chest pain., Disp: 100 tablet, Rfl: 3 .  terazosin (HYTRIN) 5 MG capsule, TAKE 1 CAPSULE BY MOUTH  DAILY AT BEDTIME, Disp: 90 capsule, Rfl: 3 .  tiZANidine (ZANAFLEX) 2 MG tablet, Take 1 tablet (2 mg total) by mouth every 8 (eight) hours as needed for muscle spasms., Disp: 30 tablet, Rfl: 0   No Known Allergies   Review of Systems  Constitutional: Negative.   Respiratory: Negative.   Cardiovascular: Negative.   Gastrointestinal: Negative.   Musculoskeletal: Positive for arthralgias and arthritis.  Neurological: Negative.   Psychiatric/Behavioral: Negative.      Today's Vitals   01/27/21 1413  BP: 124/66  Pulse: 96  Temp: 98.4 F (36.9 C)  TempSrc: Oral  Weight: 241 lb 6.4 oz (109.5 kg)  Height: 6\' 1"  (1.854 m)  PainSc: 7   PainLoc:  Hip   Body mass index is 31.85 kg/m.  Wt Readings from Last 3 Encounters:  01/27/21 241 lb 6.4 oz (109.5 kg)  12/17/20 243 lb 9.6 oz (110.5 kg)  11/18/20 244 lb (110.7 kg)   Objective:  Physical Exam Vitals and nursing note reviewed.  Constitutional:      Appearance: Normal appearance.  HENT:     Head: Normocephalic and atraumatic.     Nose:     Comments: Masked     Mouth/Throat:     Comments: Masked  Cardiovascular:     Rate and Rhythm: Normal rate and regular rhythm.     Heart sounds: Normal heart sounds.  Pulmonary:     Breath sounds: Normal breath sounds.  Musculoskeletal:        General: Tenderness present.  Skin:    General: Skin is warm.  Neurological:     General: No focal deficit  present.     Mental Status: He is alert and oriented to person, place, and time.         Assessment And Plan:     1. Left hip pain Comments: I will check arthritis panel today. If positive, I will refer him to Rheum for further evaluation. If neg, plan to move forward with MRI left hip.  - ANA, IFA (with reflex) - CYCLIC CITRUL PEPTIDE ANTIBODY, IGG/IGA - Rheumatoid factor - Sedimentation rate - Uric acid  2. Hypertensive nephropathy Comments: Chronic, well controlled. He will c/w low sodium diet.   3. Chronic renal disease, stage II Comments: Chronic. Advised to stay well hydrated and keep BP well controlled.    Patient was given opportunity to ask questions. Patient verbalized understanding of the plan and was able to repeat key elements of the plan. All questions were answered to their satisfaction.   I, Maximino Greenland, MD, have reviewed all documentation for this visit. The documentation on 02/15/21 for the exam, diagnosis, procedures, and orders are all accurate and complete.  THE PATIENT IS ENCOURAGED TO PRACTICE SOCIAL DISTANCING DUE TO THE COVID-19 PANDEMIC.  There are no diagnoses linked to this encounter.

## 2021-01-29 LAB — CYCLIC CITRUL PEPTIDE ANTIBODY, IGG/IGA: Cyclic Citrullin Peptide Ab: 7 units (ref 0–19)

## 2021-01-29 LAB — ANTINUCLEAR ANTIBODIES, IFA: ANA Titer 1: NEGATIVE

## 2021-01-29 LAB — RHEUMATOID FACTOR: Rheumatoid fact SerPl-aCnc: 12.3 IU/mL (ref <14.0)

## 2021-01-29 LAB — SEDIMENTATION RATE: Sed Rate: 27 mm/hr (ref 0–30)

## 2021-01-29 LAB — URIC ACID: Uric Acid: 7.2 mg/dL (ref 3.8–8.4)

## 2021-02-01 ENCOUNTER — Other Ambulatory Visit: Payer: Self-pay | Admitting: Internal Medicine

## 2021-02-02 ENCOUNTER — Other Ambulatory Visit: Payer: Self-pay | Admitting: Internal Medicine

## 2021-02-02 ENCOUNTER — Encounter: Payer: Self-pay | Admitting: Internal Medicine

## 2021-02-03 ENCOUNTER — Encounter: Payer: Self-pay | Admitting: Internal Medicine

## 2021-02-03 ENCOUNTER — Other Ambulatory Visit: Payer: Self-pay | Admitting: Internal Medicine

## 2021-02-03 DIAGNOSIS — M25552 Pain in left hip: Secondary | ICD-10-CM

## 2021-02-04 ENCOUNTER — Ambulatory Visit
Admission: RE | Admit: 2021-02-04 | Discharge: 2021-02-04 | Disposition: A | Discharge status: Home / Self Care | Payer: Medicare Other | Source: Ambulatory Visit | Attending: Internal Medicine | Admitting: Internal Medicine

## 2021-02-04 DIAGNOSIS — M25552 Pain in left hip: Secondary | ICD-10-CM

## 2021-02-04 DIAGNOSIS — C7951 Secondary malignant neoplasm of bone: Secondary | ICD-10-CM | POA: Diagnosis not present

## 2021-02-05 ENCOUNTER — Other Ambulatory Visit: Payer: Self-pay | Admitting: Internal Medicine

## 2021-02-05 DIAGNOSIS — R9389 Abnormal findings on diagnostic imaging of other specified body structures: Secondary | ICD-10-CM

## 2021-02-07 ENCOUNTER — Other Ambulatory Visit: Payer: Self-pay | Admitting: Internal Medicine

## 2021-02-07 DIAGNOSIS — I129 Hypertensive chronic kidney disease with stage 1 through stage 4 chronic kidney disease, or unspecified chronic kidney disease: Secondary | ICD-10-CM

## 2021-02-08 ENCOUNTER — Other Ambulatory Visit: Payer: Medicare Other

## 2021-02-08 ENCOUNTER — Other Ambulatory Visit: Payer: Self-pay | Admitting: Internal Medicine

## 2021-02-08 ENCOUNTER — Ambulatory Visit: Payer: Self-pay | Admitting: Internal Medicine

## 2021-02-08 ENCOUNTER — Encounter: Payer: Self-pay | Admitting: Internal Medicine

## 2021-02-08 ENCOUNTER — Other Ambulatory Visit: Payer: Self-pay

## 2021-02-08 DIAGNOSIS — D649 Anemia, unspecified: Secondary | ICD-10-CM | POA: Diagnosis not present

## 2021-02-08 DIAGNOSIS — I129 Hypertensive chronic kidney disease with stage 1 through stage 4 chronic kidney disease, or unspecified chronic kidney disease: Secondary | ICD-10-CM

## 2021-02-08 DIAGNOSIS — C7989 Secondary malignant neoplasm of other specified sites: Secondary | ICD-10-CM

## 2021-02-09 LAB — CBC
Hematocrit: 32.1 % — ABNORMAL LOW (ref 37.5–51.0)
Hemoglobin: 10.2 g/dL — ABNORMAL LOW (ref 13.0–17.7)
MCH: 24.9 pg — ABNORMAL LOW (ref 26.6–33.0)
MCHC: 31.8 g/dL (ref 31.5–35.7)
MCV: 79 fL (ref 79–97)
Platelets: 277 10*3/uL (ref 150–450)
RBC: 4.09 x10E6/uL — ABNORMAL LOW (ref 4.14–5.80)
RDW: 16.2 % — ABNORMAL HIGH (ref 11.6–15.4)
WBC: 2.7 10*3/uL — ABNORMAL LOW (ref 3.4–10.8)

## 2021-02-09 LAB — PROTEIN ELECTROPHORESIS, SERUM
A/G Ratio: 1.1 (ref 0.7–1.7)
Albumin ELP: 3.7 g/dL (ref 2.9–4.4)
Alpha 1: 0.2 g/dL (ref 0.0–0.4)
Alpha 2: 0.7 g/dL (ref 0.4–1.0)
Beta: 1.1 g/dL (ref 0.7–1.3)
Gamma Globulin: 1.4 g/dL (ref 0.4–1.8)
Globulin, Total: 3.5 g/dL (ref 2.2–3.9)
M-Spike, %: 0.4 g/dL — ABNORMAL HIGH
Total Protein: 7.2 g/dL (ref 6.0–8.5)

## 2021-02-10 LAB — BASIC METABOLIC PANEL
BUN/Creatinine Ratio: 13 (ref 10–24)
BUN: 19 mg/dL (ref 8–27)
CO2: 20 mmol/L (ref 20–29)
Calcium: 11.8 mg/dL — ABNORMAL HIGH (ref 8.6–10.2)
Chloride: 106 mmol/L (ref 96–106)
Creatinine, Ser: 1.52 mg/dL — ABNORMAL HIGH (ref 0.76–1.27)
Glucose: 99 mg/dL (ref 65–99)
Potassium: 4.7 mmol/L (ref 3.5–5.2)
Sodium: 146 mmol/L — ABNORMAL HIGH (ref 134–144)
eGFR: 46 mL/min/{1.73_m2} — ABNORMAL LOW (ref >59)

## 2021-02-10 LAB — SPECIMEN STATUS REPORT

## 2021-02-11 ENCOUNTER — Other Ambulatory Visit: Payer: Self-pay | Admitting: Internal Medicine

## 2021-02-11 DIAGNOSIS — D472 Monoclonal gammopathy: Secondary | ICD-10-CM

## 2021-02-15 DIAGNOSIS — C7989 Secondary malignant neoplasm of other specified sites: Secondary | ICD-10-CM | POA: Diagnosis not present

## 2021-02-15 DIAGNOSIS — C801 Malignant (primary) neoplasm, unspecified: Secondary | ICD-10-CM | POA: Diagnosis not present

## 2021-02-15 DIAGNOSIS — K7689 Other specified diseases of liver: Secondary | ICD-10-CM | POA: Diagnosis not present

## 2021-02-15 DIAGNOSIS — N281 Cyst of kidney, acquired: Secondary | ICD-10-CM | POA: Diagnosis not present

## 2021-02-16 ENCOUNTER — Telehealth: Payer: Self-pay | Admitting: Internal Medicine

## 2021-02-16 ENCOUNTER — Encounter: Payer: Self-pay | Admitting: Internal Medicine

## 2021-02-16 NOTE — Telephone Encounter (Signed)
Contacted pt to discuss CT scan results performed at [facility]. No primary source identified. Questionable sarcoma.   He would like to see [facility] oncologist instead of [facility] as initially requested. Referral coordinator notified. Pt advised that he may not be able to see results since this was done at an outside facility.   RS

## 2021-02-17 ENCOUNTER — Other Ambulatory Visit: Payer: Self-pay | Admitting: Internal Medicine

## 2021-02-17 ENCOUNTER — Encounter: Payer: Self-pay | Admitting: Internal Medicine

## 2021-02-17 DIAGNOSIS — R9389 Abnormal findings on diagnostic imaging of other specified body structures: Secondary | ICD-10-CM

## 2021-02-17 DIAGNOSIS — R350 Frequency of micturition: Secondary | ICD-10-CM | POA: Diagnosis not present

## 2021-02-17 DIAGNOSIS — R3914 Feeling of incomplete bladder emptying: Secondary | ICD-10-CM | POA: Diagnosis not present

## 2021-02-17 DIAGNOSIS — D472 Monoclonal gammopathy: Secondary | ICD-10-CM

## 2021-02-22 ENCOUNTER — Other Ambulatory Visit: Payer: Self-pay | Admitting: Internal Medicine

## 2021-02-22 DIAGNOSIS — C7989 Secondary malignant neoplasm of other specified sites: Secondary | ICD-10-CM

## 2021-02-22 DIAGNOSIS — D472 Monoclonal gammopathy: Secondary | ICD-10-CM

## 2021-02-23 ENCOUNTER — Other Ambulatory Visit: Payer: Medicare Other

## 2021-02-23 ENCOUNTER — Other Ambulatory Visit: Payer: Self-pay

## 2021-02-23 DIAGNOSIS — C7989 Secondary malignant neoplasm of other specified sites: Secondary | ICD-10-CM | POA: Diagnosis not present

## 2021-02-23 DIAGNOSIS — D472 Monoclonal gammopathy: Secondary | ICD-10-CM | POA: Diagnosis not present

## 2021-02-24 LAB — IMMUNOFIXATION, SERUM
IgA/Immunoglobulin A, Serum: 137 mg/dL (ref 61–437)
IgG (Immunoglobin G), Serum: 1407 mg/dL (ref 603–1613)
IgM (Immunoglobulin M), Srm: 32 mg/dL (ref 15–143)

## 2021-02-24 LAB — SEDIMENTATION RATE: Sed Rate: 11 mm/hr (ref 0–30)

## 2021-02-24 LAB — C-REACTIVE PROTEIN: CRP: <1 mg/L (ref 0–10)

## 2021-02-26 ENCOUNTER — Other Ambulatory Visit: Payer: Medicare Other

## 2021-02-26 LAB — PROTEIN ELECTROPHORESIS, URINE REFLEX
.: 0
Albumin ELP, Urine: 7.6 %
Alpha-1-Globulin, U: 4.2 %
Alpha-2-Globulin, U: 7.6 %
Beta Globulin, U: 18 %
Gamma Globulin, U: 62.7 %
M Component, Ur: 52.5 % — ABNORMAL HIGH
Protein, Ur: 128.7 mg/dL

## 2021-02-26 LAB — IFE REFLEX, RANDOM URINE

## 2021-03-01 ENCOUNTER — Other Ambulatory Visit: Payer: Self-pay | Admitting: Internal Medicine

## 2021-03-01 ENCOUNTER — Encounter: Payer: Self-pay | Admitting: Internal Medicine

## 2021-03-01 MED ORDER — HYDROCODONE-ACETAMINOPHEN 5-325 MG PO TABS: 1.0000 | ORAL_TABLET | Freq: Four times a day (QID) | ORAL | 0 refills | Status: DC | PRN

## 2021-03-04 ENCOUNTER — Telehealth: Payer: Self-pay | Admitting: Internal Medicine

## 2021-03-04 DIAGNOSIS — D480 Neoplasm of uncertain behavior of bone and articular cartilage: Secondary | ICD-10-CM | POA: Diagnosis not present

## 2021-03-04 DIAGNOSIS — C9000 Multiple myeloma not having achieved remission: Secondary | ICD-10-CM | POA: Diagnosis not present

## 2021-03-04 NOTE — Telephone Encounter (Signed)
Raman, NP notified by answering service that pt had elevated calcium that was checked by [facility] provider earlier today. [facility] provider suggested iv hydration. Raman, NP notified patient to go to ER for hydration to address hypercalcemia, level 12.3. Pt currently being evaluated for MM.

## 2021-03-05 ENCOUNTER — Emergency Department
Admission: EM | Admit: 2021-03-05 | Discharge: 2021-03-05 | Disposition: A | Discharge status: Home / Self Care | Payer: Medicare Other | Attending: Emergency Medicine | Admitting: Emergency Medicine

## 2021-03-05 ENCOUNTER — Encounter: Payer: Self-pay | Admitting: Internal Medicine

## 2021-03-05 ENCOUNTER — Other Ambulatory Visit: Payer: Self-pay

## 2021-03-05 DIAGNOSIS — M25551 Pain in right hip: Secondary | ICD-10-CM | POA: Diagnosis not present

## 2021-03-05 DIAGNOSIS — Z79899 Other long term (current) drug therapy: Secondary | ICD-10-CM | POA: Diagnosis not present

## 2021-03-05 DIAGNOSIS — Z87891 Personal history of nicotine dependence: Secondary | ICD-10-CM | POA: Diagnosis not present

## 2021-03-05 DIAGNOSIS — I10 Essential (primary) hypertension: Secondary | ICD-10-CM | POA: Diagnosis not present

## 2021-03-05 DIAGNOSIS — M25552 Pain in left hip: Secondary | ICD-10-CM | POA: Diagnosis not present

## 2021-03-05 DIAGNOSIS — R9431 Abnormal electrocardiogram [ECG] [EKG]: Secondary | ICD-10-CM | POA: Diagnosis not present

## 2021-03-05 HISTORY — DX: Multiple myeloma not having achieved remission: C90.00

## 2021-03-05 LAB — CBC WITH DIFFERENTIAL/PLATELET
Abs Immature Granulocytes: 0.01 10*3/uL (ref 0.00–0.07)
Basophils Absolute: 0 10*3/uL (ref 0.0–0.1)
Basophils Relative: 1 %
Eosinophils Absolute: 0.1 10*3/uL (ref 0.0–0.5)
Eosinophils Relative: 2 %
HCT: 31.9 % — ABNORMAL LOW (ref 39.0–52.0)
Hemoglobin: 9.8 g/dL — ABNORMAL LOW (ref 13.0–17.0)
Immature Granulocytes: 0 %
Lymphocytes Relative: 28 %
Lymphs Abs: 0.9 10*3/uL (ref 0.7–4.0)
MCH: 25.2 pg — ABNORMAL LOW (ref 26.0–34.0)
MCHC: 30.7 g/dL (ref 30.0–36.0)
MCV: 82 fL (ref 80.0–100.0)
Monocytes Absolute: 0.5 10*3/uL (ref 0.1–1.0)
Monocytes Relative: 14 %
Neutro Abs: 1.8 10*3/uL (ref 1.7–7.7)
Neutrophils Relative %: 55 %
Platelets: 247 10*3/uL (ref 150–400)
RBC: 3.89 MIL/uL — ABNORMAL LOW (ref 4.22–5.81)
RDW: 16.6 % — ABNORMAL HIGH (ref 11.5–15.5)
WBC: 3.3 10*3/uL — ABNORMAL LOW (ref 4.0–10.5)
nRBC: 0 % (ref 0.0–0.2)

## 2021-03-05 LAB — COMPREHENSIVE METABOLIC PANEL
ALT: 27 U/L (ref 0–44)
AST: 29 U/L (ref 15–41)
Albumin: 4.1 g/dL (ref 3.5–5.0)
Alkaline Phosphatase: 75 U/L (ref 38–126)
Anion gap: 7 (ref 5–15)
BUN: 24 mg/dL — ABNORMAL HIGH (ref 8–23)
CO2: 26 mmol/L (ref 22–32)
Calcium: 12.4 mg/dL — ABNORMAL HIGH (ref 8.9–10.3)
Chloride: 105 mmol/L (ref 98–111)
Creatinine, Ser: 1.42 mg/dL — ABNORMAL HIGH (ref 0.61–1.24)
GFR, Estimated: 50 mL/min — ABNORMAL LOW (ref >60)
Glucose, Bld: 111 mg/dL — ABNORMAL HIGH (ref 70–99)
Potassium: 3.9 mmol/L (ref 3.5–5.1)
Sodium: 138 mmol/L (ref 135–145)
Total Bilirubin: 1.2 mg/dL (ref 0.3–1.2)
Total Protein: 7.3 g/dL (ref 6.5–8.1)

## 2021-03-05 LAB — MAGNESIUM: Magnesium: 2.2 mg/dL (ref 1.7–2.4)

## 2021-03-05 LAB — LIPASE, BLOOD: Lipase: 32 U/L (ref 11–51)

## 2021-03-05 LAB — PHOSPHORUS: Phosphorus: 3.5 mg/dL (ref 2.5–4.6)

## 2021-03-05 MED ORDER — SODIUM CHLORIDE 0.9 % IV BOLUS
1000.0000 mL | Freq: Once | INTRAVENOUS | Status: AC
  Administered 2021-03-05: 1000 mL via INTRAVENOUS

## 2021-03-05 NOTE — ED Triage Notes (Signed)
Pt states he was at [facility] yesterday for lab work, states he was called this morning with elevated Ca+ 12.3 . 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 Pt c/o left hip pain. Pt is a/ox4. Ambulatory with a cane with a steady gait.

## 2021-03-05 NOTE — ED Provider Notes (Signed)
[facility] Emergency Department Provider Note  ____________________________________________   Event Date/Time   First MD Initiated Contact with Patient 03/05/21 [phone]     (approximate)  I have reviewed the triage vital signs and the nursing notes.   HISTORY  Chief Complaint Abnormal Lab    HPI [name] is a 80 y.o. male with hypertension, hyperlipidemia, A. fib on Eliquis recent diagnosis of multiple myeloma who comes in for elevated calcium.  Patient was told that he need to come in for IV hydration.  Patient denies any new symptoms.  He states that he is not having hallucinations, confusions, new bone aches, back pain or signs of kidney stones.  He otherwise feels his baseline self.  He does have some bilateral hip pain but he is followed by oncology at [facility] who states that he has some lesions there that are consistent with his myeloma which is thought to be the cause of the pain.  This pain is not new, constant, worse with ambulation.  Patient still able to ambulate.  Patient denies any recent falls.  On review of records patient is seen by Dr. Nydia Bouton at [facility] and is being currently worked up for multiple myeloma.  He went yesterday for his initial tract has not been started on any kind of chemotherapy or radiation therapy.  On his note he did mention that patient's calcium was 12.3. They sent patient here for some IV fluids.          Past Medical History:  Diagnosis Date  . A-fib ([place])   . Allergy   . Hyperlipidemia   . Hypertension   . Prostatic hypertrophy   . Sinus trouble   . Sleep apnea     Patient Active Problem List   Diagnosis Date Noted  . 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 Positive occult stool blood test 04/24/2020  . 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 Anemia 12/19/2019  . Paroxysmal atrial fibrillation ([place]) 12/19/2019  . Atypical chest pain 12/19/2019  . Sinus arrhythmia 11/02/2019  . Dizziness 11/02/2019  .  PVC (premature ventricular contraction) 11/02/2019  . 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 OSA (obstructive sleep apnea) 12/25/2012    Past Surgical History:  Procedure Laterality Date  . HERNIA REPAIR    . VASECTOMY      Prior to Admission medications   Medication Sig Start Date End Date Taking? Authorizing Provider  atorvastatin (LIPITOR) 40 MG tablet Take 0.5 tablets (20 mg total) by mouth daily. Patient taking differently: Take 40 mg by mouth daily.  09/21/20   Glendale Chard, MD  ELIQUIS 5 MG TABS tablet TAKE 1 TABLET(5 MG) BY MOUTH TWICE DAILY 04/09/20   Patwardhan, Manish J, MD  finasteride (PROSCAR) 5 MG tablet Take 5 mg by mouth daily.    [provider]  hydrochlorothiazide (MICROZIDE) 12.5 MG capsule TAKE 1 CAPSULE BY MOUTH  DAILY 07/30/20   Glendale Chard, MD  HYDROcodone-acetaminophen (NORCO/VICODIN) 5-325 MG tablet Take 1 tablet by mouth every 6 (six) hours as needed for moderate pain. 03/01/21   Glendale Chard, MD  Javier Docker Oil 1000 MG CAPS Take 1,000 mg by mouth daily.    [provider]  Multiple Vitamin (MULTIVITAMIN) tablet Take 1 tablet by mouth daily.    [provider]  nitroGLYCERIN (NITROSTAT) 0.4 MG SL tablet Place 1 tablet (0.4 mg total) under the tongue every 5 (five) minutes as needed for chest pain. 12/16/19 12/15/20  Lavonia Drafts,  MD  terazosin (HYTRIN) 5 MG capsule TAKE 1 CAPSULE BY MOUTH  DAILY AT BEDTIME 07/30/20   Glendale Chard, MD  tiZANidine (ZANAFLEX) 2 MG tablet Take 1 tablet (2 mg total) by mouth every 8 (eight) hours as needed for muscle spasms. 12/17/20   Bary Castilla, NP    Allergies Patient has no known allergies.  Family History  Problem Relation Age of Onset  . Hypertension Other   . Prostate cancer Father   . Testicular cancer Brother   . Prostate cancer Brother   . Lung cancer Brother   . Cancer Brother        double mynoma  . Healthy Mother     Social History Social History    Tobacco Use  . Smoking status: Former Smoker    Packs/day: 0.50    Years: 35.00    Pack years: 17.50    Types: Cigarettes    Quit date: 12/12/1997    Years since quitting: 23.2  . Smokeless tobacco: Never Used  . Tobacco comment: 12 cigs daily  Vaping Use  . Vaping Use: Never used  Substance Use Topics  . Alcohol use: Yes    Comment: ocasional beer  . Drug use: No      Review of Systems Constitutional: No fever/chills, abnormal lab Eyes: No visual changes. ENT: No sore throat. Cardiovascular: Denies chest pain. Respiratory: Denies shortness of breath. Gastrointestinal: No abdominal pain.  No nausea, no vomiting.  No diarrhea.  No constipation. Genitourinary: Negative for dysuria. Musculoskeletal: Negative for back pain.  Bilateral hip pain Skin: Negative for rash. Neurological: Negative for headaches, focal weakness or numbness. All other ROS negative ____________________________________________   PHYSICAL EXAM:  VITAL SIGNS: Blood pressure (!) 147/79, pulse 92, temperature 97.9 F (36.6 C), temperature source Oral, resp. rate 18, height _0  (1.905 m), weight 108 kg, SpO2 98 %.   Constitutional: Alert and oriented. Well appearing and in no acute distress. Eyes: Conjunctivae are normal. EOMI. Head: Atraumatic. Nose: No congestion/rhinnorhea. Mouth/Throat: Mucous membranes are moist.   Neck: No stridor. Trachea Midline. FROM Cardiovascular: Normal rate, regular rhythm. Grossly normal heart sounds.  Good peripheral circulation. Respiratory: Normal respiratory effort.  No retractions. Lungs CTAB. Gastrointestinal: Soft and nontender. No distention. No abdominal bruits.  Musculoskeletal: No lower extremity tenderness nor edema.  No joint effusions.  Chronic bilateral hip pain.  Still able to ambulate with a cane. Neurologic:  Normal speech and language. No gross focal neurologic deficits are appreciated.  Skin:  Skin is warm, dry and intact. No rash  noted. Psychiatric: Mood and affect are normal. Speech and behavior are normal. GU: Deferred   ____________________________________________   LABS (all labs ordered are listed, but only abnormal results are displayed)  Labs Reviewed  CBC WITH DIFFERENTIAL/PLATELET - Abnormal; Notable for the following components:      Result Value   WBC 3.3 (*)    RBC 3.89 (*)    Hemoglobin 9.8 (*)    HCT 31.9 (*)    MCH 25.2 (*)    RDW 16.6 (*)    All other components within normal limits  COMPREHENSIVE METABOLIC PANEL - Abnormal; Notable for the following components:   Glucose, Bld 111 (*)    BUN 24 (*)    Creatinine, Ser 1.42 (*)    Calcium 12.4 (*)    GFR, Estimated 50 (*)    All other components within normal limits  PHOSPHORUS  MAGNESIUM  LIPASE, BLOOD  CALCIUM, IONIZED   ____________________________________________  ED ECG REPORT I, Vanessa Derry, the attending physician, personally viewed and interpreted this ECG.  EKG is normal sinus rate of 81, no ST elevation, no T wave inversions, normal intervals ____________________________________________   PROCEDURES  Procedure(s) performed (including Critical Care):  .1-3 Lead EKG Interpretation Performed by: Vanessa [place], MD Authorized by: Vanessa [place], MD     Interpretation: normal     ECG rate:  80-90s    ECG rate assessment: normal     Rhythm: sinus rhythm     Ectopy: none     Conduction: normal       ____________________________________________   INITIAL IMPRESSION / ASSESSMENT AND PLAN / ED COURSE  Kathlyn Sacramento was evaluated in Emergency Department on 03/05/2021 for the symptoms described in the history of present illness. He was evaluated in the context of the global COVID-19 pandemic, which necessitated consideration that the patient might be at risk for infection with the SARS-CoV-2 virus that causes COVID-19. Institutional protocols and algorithms that pertain to the evaluation of patients at risk for  COVID-19 are in a state of rapid change based on information released by regulatory bodies including the CDC and federal and state organizations. These policies and algorithms were followed during the patient's care in the ED.     Patient is a 80 year old who comes in with hypercalcemia without any new symptoms related to it.  We will check the rest of his electrolytes including magnesium, phosphorus, potassium.  We will keep patient on cardiac monitoring and EKG to evaluate for QTC and other arrhythmias.  At this time patient is asymptomatic and has only his baseline bone pain that has been worked up for and there working him up for multiple myeloma.  His hip pain could be related to lytic lesions on his hips.  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  Therefore I suspect that this is more likely the cause of the pain over the calcium level.  Patient still able to ambulate and denies any new falls to suggest [place] need to reimage to look for pathological fracture.    I suspect that his elevated calcium is related to this diagnosis of concern for multiple myeloma.  There is also a chance that his calcium level could be slightly elevated but his nonionized calcium could be normal this is due to paraproteins binding the calcium and multiple myeloma so I did send off an ionized calcium as well.  This will not result on my shift but I have alerted patient to follow-up with it on MyChart.  Typically you can sometimes treat the hypercalcemia with steroids but I would want to wait for an oncologist to start this given they have not finished working him up and have a final diagnosis and I do not want to interfere with  possible bone biopsy if 1 is required.  On review of records patient is also on hydrochlorothiazide which could be contributing to elevated calcium levels.  I told patient he should hold this until he gets follow-up  with his PCP        ____________________________________________   FINAL CLINICAL IMPRESSION(S) / ED DIAGNOSES   Final diagnoses:  Hypercalcemia      MEDICATIONS GIVEN DURING THIS VISIT:  Medications  sodium chloride 0.9 % bolus 1,000 mL (1,000 mLs Intravenous New Bag/Given 03/05/21 3382)     ED Discharge Orders    None  Note:  This document was prepared using Dragon voice recognition software and may include unintentional dictation errors.   Vanessa [place], MD 03/05/21 1004

## 2021-03-05 NOTE — Discharge Instructions (Addendum)
Your high calcium level is most likely secondary to your work-up for possible multiple myeloma.  You should talk to your primary doctor and your oncology doctor.  They may want to start you on some medication for this such as steroids but they will need to wait until after your biopsy typically.  You should stop taking your hydrochlorothiazide given that can also cause your calcium level to be elevated.  You will need to have a recheck of your blood pressure in 2 days with your primary care doctor and decide if they want to start you on something else.  Your ionized calcium is still pending and you will follow this up on MyChart.  Discussed this result with your primary care doctor as it can will be more accurate in evaluating your calcium level  Return to the ER if you develop confusion, fatigue or any other concerns

## 2021-03-06 DIAGNOSIS — C9000 Multiple myeloma not having achieved remission: Secondary | ICD-10-CM | POA: Diagnosis not present

## 2021-03-06 DIAGNOSIS — D480 Neoplasm of uncertain behavior of bone and articular cartilage: Secondary | ICD-10-CM | POA: Diagnosis not present

## 2021-03-07 LAB — CALCIUM, IONIZED: Calcium, Ionized, Serum: 7 mg/dL — ABNORMAL HIGH (ref 4.5–5.6)

## 2021-03-09 ENCOUNTER — Encounter: Payer: Self-pay | Admitting: Nurse Practitioner

## 2021-03-09 ENCOUNTER — Other Ambulatory Visit: Payer: Self-pay

## 2021-03-09 ENCOUNTER — Ambulatory Visit (INDEPENDENT_AMBULATORY_CARE_PROVIDER_SITE_OTHER): Payer: Medicare Other | Admitting: Nurse Practitioner

## 2021-03-09 NOTE — Progress Notes (Signed)
I,Davanee Klinkner,acting as a Education administrator for Minette Brine, FNP.,have documented all relevant documentation on the behalf of Minette Brine, FNP,as directed by  Minette Brine, FNP while in the presence of Minette Brine, [place].  This visit occurred during the SARS-CoV-2 public health emergency.  Safety protocols were in place, including screening questions prior to the visit, additional usage of staff PPE, and extensive cleaning of exam room while observing appropriate contact time as indicated for disinfecting solutions.  Subjective:     Patient ID: Edward Gallagher , male    DOB: 08-Nov-1941 , 80 y.o.   MRN: [id number]   Chief Complaint  Patient presents with  . ER f/u    HPI   Pt is here for an ED follow up.   He had ER visit on 3/25 for elevated calcium, was given IV fluids. He is taking HCTZ and finasteride.  He has a blood pressure at home.    Arthritis Presents for follow-up visit. He complains of pain.  Leg Pain  The incident occurred more than 1 week ago. There was no injury mechanism. The pain is present in the right leg. The quality of the pain is described as cramping. The pain is moderate. The pain has been intermittent since onset. Pertinent negatives include no loss of sensation, numbness or tingling. He has tried rest for the symptoms.     Past Medical History:  Diagnosis Date  . A-fib ([place])   . Allergy   . Hyperlipidemia   . Hypertension   . Multiple myeloma ([place])   . Prostatic hypertrophy   . Sinus trouble   . Sleep apnea      Family History  Problem Relation Age of Onset  . Hypertension Other   . Prostate cancer Father   . Testicular cancer Brother   . Prostate cancer Brother   . Lung cancer Brother   . Cancer Brother        double mynoma  . Healthy Mother      Current Outpatient Medications:  .  atorvastatin (LIPITOR) 40 MG tablet, Take 0.5 tablets (20 mg total) by mouth daily. (Patient taking differently: Take 40 mg by mouth daily.), Disp: 90 tablet, Rfl: 1 .   ELIQUIS 5 MG TABS tablet, TAKE 1 TABLET(5 MG) BY MOUTH TWICE DAILY, Disp: 180 tablet, Rfl: 3 .  finasteride (PROSCAR) 5 MG tablet, Take 5 mg by mouth daily., Disp: , Rfl:  .  hydrochlorothiazide (MICROZIDE) 12.5 MG capsule, TAKE 1 CAPSULE BY MOUTH  DAILY, Disp: 90 capsule, Rfl: 3 .  HYDROcodone-acetaminophen (NORCO/VICODIN) 5-325 MG tablet, Take 1 tablet by mouth every 6 (six) hours as needed for moderate pain., Disp: 30 tablet, Rfl: 0 .  Krill Oil 1000 MG CAPS, Take 1,000 mg by mouth daily., Disp: , Rfl:  .  Multiple Vitamin (MULTIVITAMIN) tablet, Take 1 tablet by mouth daily., Disp: , Rfl:  .  nitroGLYCERIN (NITROSTAT) 0.4 MG SL tablet, Place 1 tablet (0.4 mg total) under the tongue every 5 (five) minutes as needed for chest pain., Disp: 100 tablet, Rfl: 3 .  terazosin (HYTRIN) 5 MG capsule, TAKE 1 CAPSULE BY MOUTH  DAILY AT BEDTIME, Disp: 90 capsule, Rfl: 3 .  tiZANidine (ZANAFLEX) 2 MG tablet, Take 1 tablet (2 mg total) by mouth every 8 (eight) hours as needed for muscle spasms., Disp: 30 tablet, Rfl: 0   No Known Allergies   Review of Systems  Constitutional: Negative.   HENT: Negative.   Eyes: Negative.   Respiratory: Negative.  Cardiovascular: Negative.   Gastrointestinal: Negative.   Endocrine: Negative.   Genitourinary: Negative.   Musculoskeletal: Positive for arthritis.  Skin: Negative.   Allergic/Immunologic: Negative.   Neurological: Negative.  Negative for tingling and numbness.  Hematological: Negative.   Psychiatric/Behavioral: Negative.      Today's Vitals   03/09/21 1538  BP: 128/72  Pulse: 91  Temp: 98.3 F (36.8 C)  TempSrc: Oral  Weight: 239 lb 9.6 oz (108.7 kg)  Height: '6\' 3"'  (1.905 m)  PainSc: 0-No pain   Body mass index is 29.95 kg/m.   Objective:  Physical Exam Constitutional:      General: He is not in acute distress.    Appearance: Normal appearance.  Cardiovascular:     Rate and Rhythm: Normal rate and regular rhythm.     Pulses: Normal  pulses.     Heart sounds: Normal heart sounds. No murmur heard.   Pulmonary:     Effort: Pulmonary effort is normal. No respiratory distress.     Breath sounds: Normal breath sounds. No wheezing.  Musculoskeletal:        General: No tenderness.     Comments: Using rolling walker  Neurological:     General: No focal deficit present.     Mental Status: He is alert and oriented to person, place, and time.     Cranial Nerves: No cranial nerve deficit.  Psychiatric:        Mood and Affect: Mood normal.        Thought Content: Thought content normal.        Judgment: Judgment normal.         Assessment And Plan:     1. Hypercalcemia  He was seen in ER for IV infusion due to hypercalcemia, I will recheck calcium today and he is to continue to hold his HCTZ to see if this is the cause. Also his blood pressure is controlled at this time. He will let us know if his blood pressure is elevated at [facility]     Patient was given opportunity to ask questions. Patient verbalized understanding of the plan and was able to repeat key elements of the plan. All questions were answered to their satisfaction.  Minette Brine, FNP    I, Minette Brine, FNP, have reviewed all documentation for this visit. The documentation on 03/09/21 for the exam, diagnosis, procedures, and orders are all accurate and complete.  IF YOU HAVE BEEN REFERRED TO A SPECIALIST, IT MAY TAKE 1-2 WEEKS TO SCHEDULE/PROCESS THE REFERRAL. IF YOU HAVE NOT HEARD FROM US/SPECIALIST IN TWO WEEKS, PLEASE GIVE [place] A CALL AT [phone] X [phone].   THE PATIENT IS ENCOURAGED TO PRACTICE SOCIAL DISTANCING DUE TO THE COVID-19 PANDEMIC.

## 2021-03-09 NOTE — Patient Instructions (Signed)
   Check your blood pressure with your cuff at home this evening if it is similar to the blood pressure today continue using that cuff if not go to the local pharmacy and ask which cuff is best they offer.  Get your blood pressure while at [facility] and keep that reading, call on Thursday with your blood pressure readings and we can decide which medication to use.

## 2021-03-10 ENCOUNTER — Encounter: Payer: Self-pay | Admitting: Internal Medicine

## 2021-03-10 DIAGNOSIS — Z87891 Personal history of nicotine dependence: Secondary | ICD-10-CM | POA: Diagnosis not present

## 2021-03-10 DIAGNOSIS — M25551 Pain in right hip: Secondary | ICD-10-CM | POA: Diagnosis not present

## 2021-03-10 DIAGNOSIS — M899 Disorder of bone, unspecified: Secondary | ICD-10-CM | POA: Diagnosis not present

## 2021-03-10 DIAGNOSIS — Z5112 Encounter for antineoplastic immunotherapy: Secondary | ICD-10-CM | POA: Diagnosis not present

## 2021-03-10 DIAGNOSIS — M25552 Pain in left hip: Secondary | ICD-10-CM | POA: Diagnosis not present

## 2021-03-10 DIAGNOSIS — C9000 Multiple myeloma not having achieved remission: Secondary | ICD-10-CM | POA: Diagnosis not present

## 2021-03-10 LAB — BMP8+EGFR
BUN/Creatinine Ratio: 12 (ref 10–24)
BUN: 18 mg/dL (ref 8–27)
CO2: 25 mmol/L (ref 20–29)
Calcium: 12.3 mg/dL — ABNORMAL HIGH (ref 8.6–10.2)
Chloride: 103 mmol/L (ref 96–106)
Creatinine, Ser: 1.49 mg/dL — ABNORMAL HIGH (ref 0.76–1.27)
Glucose: 89 mg/dL (ref 65–99)
Potassium: 4.2 mmol/L (ref 3.5–5.2)
Sodium: 143 mmol/L (ref 134–144)
eGFR: 47 mL/min/{1.73_m2} — ABNORMAL LOW (ref >59)

## 2021-03-11 ENCOUNTER — Encounter: Payer: Self-pay | Admitting: Internal Medicine

## 2021-03-14 ENCOUNTER — Encounter: Payer: Self-pay | Admitting: Internal Medicine

## 2021-03-15 ENCOUNTER — Other Ambulatory Visit: Payer: Self-pay

## 2021-03-15 ENCOUNTER — Other Ambulatory Visit: Payer: Self-pay | Admitting: Internal Medicine

## 2021-03-15 ENCOUNTER — Encounter: Payer: Self-pay | Admitting: Internal Medicine

## 2021-03-15 ENCOUNTER — Other Ambulatory Visit: Payer: Medicare Other

## 2021-03-15 LAB — BMP8+EGFR
BUN/Creatinine Ratio: 10 (ref 10–24)
BUN: 13 mg/dL (ref 8–27)
CO2: 23 mmol/L (ref 20–29)
Calcium: 10 mg/dL (ref 8.6–10.2)
Chloride: 109 mmol/L — ABNORMAL HIGH (ref 96–106)
Creatinine, Ser: 1.34 mg/dL — ABNORMAL HIGH (ref 0.76–1.27)
Glucose: 96 mg/dL (ref 65–99)
Potassium: 4.9 mmol/L (ref 3.5–5.2)
Sodium: 146 mmol/L — ABNORMAL HIGH (ref 134–144)
eGFR: 54 mL/min/{1.73_m2} — ABNORMAL LOW (ref >59)

## 2021-03-15 NOTE — Addendum Note (Signed)
Addended by: Maximino Greenland on: 03/15/2021 09:54 AM   Modules accepted: Orders

## 2021-03-17 DIAGNOSIS — C9000 Multiple myeloma not having achieved remission: Secondary | ICD-10-CM | POA: Diagnosis not present

## 2021-03-19 ENCOUNTER — Other Ambulatory Visit: Payer: Self-pay | Admitting: Cardiology

## 2021-03-19 DIAGNOSIS — I480 Paroxysmal atrial fibrillation: Secondary | ICD-10-CM

## 2021-03-23 DIAGNOSIS — I10 Essential (primary) hypertension: Secondary | ICD-10-CM | POA: Diagnosis not present

## 2021-03-23 DIAGNOSIS — I4891 Unspecified atrial fibrillation: Secondary | ICD-10-CM | POA: Diagnosis not present

## 2021-03-23 DIAGNOSIS — C9000 Multiple myeloma not having achieved remission: Secondary | ICD-10-CM | POA: Diagnosis not present

## 2021-03-23 DIAGNOSIS — Z5112 Encounter for antineoplastic immunotherapy: Secondary | ICD-10-CM | POA: Diagnosis not present

## 2021-03-30 DIAGNOSIS — C9000 Multiple myeloma not having achieved remission: Secondary | ICD-10-CM | POA: Diagnosis not present

## 2021-03-31 ENCOUNTER — Other Ambulatory Visit: Payer: Self-pay

## 2021-03-31 ENCOUNTER — Ambulatory Visit (INDEPENDENT_AMBULATORY_CARE_PROVIDER_SITE_OTHER): Payer: Medicare Other

## 2021-03-31 ENCOUNTER — Encounter: Payer: Self-pay | Admitting: Internal Medicine

## 2021-03-31 ENCOUNTER — Ambulatory Visit (INDEPENDENT_AMBULATORY_CARE_PROVIDER_SITE_OTHER): Payer: Medicare Other | Admitting: Internal Medicine

## 2021-03-31 VITALS — BP 114/66 | HR 81 | Temp 98.6°F | Ht 75.0 in | Wt 238.8 lb

## 2021-03-31 VITALS — BP 114/66 | HR 81 | Temp 98.6°F | Ht 75.0 in | Wt 238.0 lb

## 2021-03-31 DIAGNOSIS — N182 Chronic kidney disease, stage 2 (mild): Secondary | ICD-10-CM | POA: Diagnosis not present

## 2021-03-31 DIAGNOSIS — I129 Hypertensive chronic kidney disease with stage 1 through stage 4 chronic kidney disease, or unspecified chronic kidney disease: Secondary | ICD-10-CM | POA: Diagnosis not present

## 2021-03-31 DIAGNOSIS — Z0000 Encounter for general adult medical examination without abnormal findings: Secondary | ICD-10-CM | POA: Diagnosis not present

## 2021-03-31 DIAGNOSIS — R931 Abnormal findings on diagnostic imaging of heart and coronary circulation: Secondary | ICD-10-CM

## 2021-03-31 DIAGNOSIS — C9000 Multiple myeloma not having achieved remission: Secondary | ICD-10-CM

## 2021-03-31 MED ORDER — NITROGLYCERIN 0.4 MG SL SUBL: 0.4000 mg | SUBLINGUAL_TABLET | SUBLINGUAL | 3 refills | Status: DC | PRN

## 2021-03-31 MED ORDER — ATORVASTATIN CALCIUM 40 MG PO TABS: 40.0000 mg | ORAL_TABLET | Freq: Every day | ORAL | 1 refills | Status: DC

## 2021-03-31 NOTE — Patient Instructions (Signed)
[name] , Thank you for taking time to come for your Medicare Wellness Visit. I appreciate your ongoing commitment to your health goals. Please review the following plan we discussed and let me know if I can assist you in the future.   Screening recommendations/referrals: Colonoscopy: not required Recommended yearly ophthalmology/optometry visit for glaucoma screening and checkup Recommended yearly dental visit for hygiene and checkup  Vaccinations: Influenza vaccine: completed 08/15/2020, due 07/12/2021 Pneumococcal vaccine: completed 2021 Tdap vaccine: completed 12/25/2012, due 12/25/2022 Shingles vaccine: completed   Covid-19:  09/14/2020, 01/24/2020, 01/03/2020  Advanced directives: Advance directive discussed with you today. Even though you declined this today please call our office should you change your mind and we can give you the proper paperwork for you to fill out.  Conditions/risks identified: none  Next appointment: Follow up in one year for your annual wellness visit.   Preventive Care 19 Years and Older, Male Preventive care refers to lifestyle choices and visits with your health care provider that can promote health and wellness. What does preventive care include?  A yearly physical exam. This is also called an annual well check.  Dental exams once or twice a year.  Routine eye exams. Ask your health care provider how often you should have your eyes checked.  Personal lifestyle choices, including:  Daily care of your teeth and gums.  Regular physical activity.  Eating a healthy diet.  Avoiding tobacco and drug use.  Limiting alcohol use.  Practicing safe sex.  Taking low doses of aspirin every day.  Taking vitamin and mineral supplements as recommended by your health care provider. What happens during an annual well check? The services and screenings done by your health care provider during your annual well check will depend on your age, overall health,  lifestyle risk factors, and family history of disease. Counseling  Your health care provider may ask you questions about your:  Alcohol use.  Tobacco use.  Drug use.  Emotional well-being.  Home and relationship well-being.  Sexual activity.  Eating habits.  History of falls.  Memory and ability to understand (cognition).  Work and work Statistician. Screening  You may have the following tests or measurements:  Height, weight, and BMI.  Blood pressure.  Lipid and cholesterol levels. These may be checked every 5 years, or more frequently if you are over 57 years old.  Skin check.  Lung cancer screening. You may have this screening every year starting at age 33 if you have a 30-pack-year history of smoking and currently smoke or have quit within the past 15 years.  Fecal occult blood test (FOBT) of the stool. You may have this test every year starting at age 63.  Flexible sigmoidoscopy or colonoscopy. You may have a sigmoidoscopy every 5 years or a colonoscopy every 10 years starting at age 23.  Prostate cancer screening. Recommendations will vary depending on your family history and other risks.  Hepatitis C blood test.  Hepatitis B blood test.  Sexually transmitted disease (STD) testing.  Diabetes screening. This is done by checking your blood sugar (glucose) after you have not eaten for a while (fasting). You may have this done every 1-3 years.  Abdominal aortic aneurysm (AAA) screening. You may need this if you are a current or former smoker.  Osteoporosis. You may be screened starting at age 73 if you are at high risk. Talk with your health care provider about your test results, treatment options, and if necessary, the need for more tests.  Vaccines  Your health care provider may recommend certain vaccines, such as:  Influenza vaccine. This is recommended every year.  Tetanus, diphtheria, and acellular pertussis (Tdap, Td) vaccine. You may need a Td booster  every 10 years.  Zoster vaccine. You may need this after age 45.  Pneumococcal 13-valent conjugate (PCV13) vaccine. One dose is recommended after age 84.  Pneumococcal polysaccharide (PPSV23) vaccine. One dose is recommended after age 36. Talk to your health care provider about which screenings and vaccines you need and how often you need them. This information is not intended to replace advice given to you by your health care provider. Make sure you discuss any questions you have with your health care provider. Document Released: 12/25/2015 Document Revised: 08/17/2016 Document Reviewed: 09/29/2015 Elsevier Interactive Patient Education  2017 [employer] Prevention in the Home Falls can cause injuries. They can happen to people of all ages. There are many things you can do to make your home safe and to help prevent falls. What can I do on the outside of my home?  Regularly fix the edges of walkways and driveways and fix any cracks.  Remove anything that might make you trip as you walk through a door, such as a raised step or threshold.  Trim any bushes or trees on the path to your home.  Use bright outdoor lighting.  Clear any walking paths of anything that might make someone trip, such as rocks or tools.  Regularly check to see if handrails are loose or broken. Make sure that both sides of any steps have handrails.  Any raised decks and porches should have guardrails on the edges.  Have any leaves, snow, or ice cleared regularly.  Use sand or salt on walking paths during winter.  Clean up any spills in your garage right away. This includes oil or grease spills. What can I do in the bathroom?  Use night lights.  Install grab bars by the toilet and in the tub and shower. Do not use towel bars as grab bars.  Use non-skid mats or decals in the tub or shower.  If you need to sit down in the shower, use a plastic, non-slip stool.  Keep the floor dry. Clean up any  water that spills on the floor as soon as it happens.  Remove soap buildup in the tub or shower regularly.  Attach bath mats securely with double-sided non-slip rug tape.  Do not have throw rugs and other things on the floor that can make you trip. What can I do in the bedroom?  Use night lights.  Make sure that you have a light by your bed that is easy to reach.  Do not use any sheets or blankets that are too big for your bed. They should not hang down onto the floor.  Have a firm chair that has side arms. You can use this for support while you get dressed.  Do not have throw rugs and other things on the floor that can make you trip. What can I do in the kitchen?  Clean up any spills right away.  Avoid walking on wet floors.  Keep items that you use a lot in easy-to-reach places.  If you need to reach something above you, use a strong step stool that has a grab bar.  Keep electrical cords out of the way.  Do not use floor polish or wax that makes floors slippery. If you must use wax, use non-skid floor wax.  Do not have throw rugs and other things on the floor that can make you trip. What can I do with my stairs?  Do not leave any items on the stairs.  Make sure that there are handrails on both sides of the stairs and use them. Fix handrails that are broken or loose. Make sure that handrails are as long as the stairways.  Check any carpeting to make sure that it is firmly attached to the stairs. Fix any carpet that is loose or worn.  Avoid having throw rugs at the top or bottom of the stairs. If you do have throw rugs, attach them to the floor with carpet tape.  Make sure that you have a light switch at the top of the stairs and the bottom of the stairs. If you do not have them, ask someone to add them for you. What else can I do to help prevent falls?  Wear shoes that:  Do not have high heels.  Have rubber bottoms.  Are comfortable and fit you well.  Are closed  at the toe. Do not wear sandals.  If you use a stepladder:  Make sure that it is fully opened. Do not climb a closed stepladder.  Make sure that both sides of the stepladder are locked into place.  Ask someone to hold it for you, if possible.  Clearly mark and make sure that you can see:  Any grab bars or handrails.  First and last steps.  Where the edge of each step is.  Use tools that help you move around (mobility aids) if they are needed. These include:  Canes.  Walkers.  Scooters.  Crutches.  Turn on the lights when you go into a dark area. Replace any light bulbs as soon as they burn out.  Set up your furniture so you have a clear path. Avoid moving your furniture around.  If any of your floors are uneven, fix them.  If there are any pets around you, be aware of where they are.  Review your medicines with your doctor. Some medicines can make you feel dizzy. This can increase your chance of falling. Ask your doctor what other things that you can do to help prevent falls. This information is not intended to replace advice given to you by your health care provider. Make sure you discuss any questions you have with your health care provider. Document Released: 09/24/2009 Document Revised: 05/05/2016 Document Reviewed: 01/02/2015 Elsevier Interactive Patient Education  2017 [employer].

## 2021-03-31 NOTE — Progress Notes (Signed)
I,Katawbba Wiggins,acting as a Education administrator for Maximino Greenland, MD.,have documented all relevant documentation on the behalf of Maximino Greenland, MD,as directed by  Maximino Greenland, MD while in the presence of Maximino Greenland, MD.  This visit occurred during the SARS-CoV-2 public health emergency.  Safety protocols were in place, including screening questions prior to the visit, additional usage of staff PPE, and extensive cleaning of exam room while observing appropriate contact time as indicated for disinfecting solutions.  Subjective:     Patient ID: Edward Gallagher , male    DOB: 10-02-1941 , 80 y.o.   MRN: [id number]   Chief Complaint  Patient presents with  . Hypertension    HPI  He presents today for a blood pressure check. He reports compliance with meds. Denies headaches, chest pain and shortness of breath.   Since his last visit, he has been diagnosed with MM. He is being treated at [facility]. His treatment consists of velcade and daratumumab. Thus far, he has tolerated meds without any serious adverse effects.   Hypertension This is a chronic problem. The current episode started more than 1 year ago. The problem has been gradually improving since onset. The problem is controlled. Pertinent negatives include no blurred vision, chest pain, palpitations or shortness of breath. Risk factors for coronary artery disease include male gender and obesity.     Past Medical History:  Diagnosis Date  . A-fib ([place])   . Allergy   . Hyperlipidemia   . Hypertension   . Multiple myeloma ([place])   . Prostatic hypertrophy   . Sinus trouble   . Sleep apnea      Family History  Problem Relation Age of Onset  . Hypertension Other   . Prostate cancer Father   . Testicular cancer Brother   . Prostate cancer Brother   . Lung cancer Brother   . Cancer Brother        double mynoma  . Healthy Mother      Current Outpatient Medications:  .  ELIQUIS 5 MG TABS tablet, TAKE 1 TABLET(5  MG) BY MOUTH TWICE DAILY, Disp: 180 tablet, Rfl: 3 .  finasteride (PROSCAR) 5 MG tablet, Take 5 mg by mouth daily., Disp: , Rfl:  .  Multiple Vitamin (MULTIVITAMIN) tablet, Take 1 tablet by mouth daily., Disp: , Rfl:  .  terazosin (HYTRIN) 5 MG capsule, TAKE 1 CAPSULE BY MOUTH  DAILY AT BEDTIME, Disp: 90 capsule, Rfl: 3 .  tiZANidine (ZANAFLEX) 2 MG tablet, Take 1 tablet (2 mg total) by mouth every 8 (eight) hours as needed for muscle spasms., Disp: 30 tablet, Rfl: 0 .  atorvastatin (LIPITOR) 40 MG tablet, Take 1 tablet (40 mg total) by mouth daily., Disp: 90 tablet, Rfl: 1 .  nitroGLYCERIN (NITROSTAT) 0.4 MG SL tablet, Place 1 tablet (0.4 mg total) under the tongue every 5 (five) minutes as needed for chest pain., Disp: 100 tablet, Rfl: 3   No Known Allergies   Review of Systems  Constitutional: Negative.   Eyes: Negative for blurred vision.  Respiratory: Negative.  Negative for shortness of breath.   Cardiovascular: Negative.  Negative for chest pain and palpitations.  Gastrointestinal: Negative.   Neurological: Negative.   Psychiatric/Behavioral: Negative.   All other systems reviewed and are negative.    Today's Vitals   03/31/21 0936  BP: 114/66  Pulse: 81  Temp: 98.6 F (37 C)  TempSrc: Oral  Weight: 238 lb 12.8 oz (108.3 kg)  Height:  _0  (1.905 m)  PainSc: 5   PainLoc: Hip   Body mass index is 29.85 kg/m.   Objective:  Physical Exam Vitals and nursing note reviewed.  Constitutional:      Appearance: Normal appearance.  HENT:     Head: Normocephalic and atraumatic.     Nose:     Comments: Masked     Mouth/Throat:     Comments: Masked  Cardiovascular:     Rate and Rhythm: Normal rate and regular rhythm.     Heart sounds: Normal heart sounds.  Pulmonary:     Effort: Pulmonary effort is normal.     Breath sounds: Normal breath sounds.  Skin:    General: Skin is warm.  Neurological:     General: No focal deficit present.     Mental Status: He is alert.   Psychiatric:        Mood and Affect: Mood normal.         Assessment And Plan:     1. Hypertensive nephropathy Comments: Chronic, well controlled .I will not make any medication changes at this time.   2. Chronic renal disease, stage II Comments: Chronic. He is encouraged to stay well hydrated.   3. Multiple myeloma not having achieved remission [facility]) Comments: Care Everywhere notes reviewed in full detail. Recent labs reviewed as well. No need to draw add'l labs at this time.   4. Elevated coronary artery calcium score Comments: He was given refill on sl NTG as requested.    Patient was given opportunity to ask questions. Patient verbalized understanding of the plan and was able to repeat key elements of the plan. All questions were answered to their satisfaction.   I, Maximino Greenland, MD, have reviewed all documentation for this visit. The documentation on 03/31/21 for the exam, diagnosis, procedures, and orders are all accurate and complete.   IF YOU HAVE BEEN REFERRED TO A SPECIALIST, IT MAY TAKE 1-2 WEEKS TO SCHEDULE/PROCESS THE REFERRAL. IF YOU HAVE NOT HEARD FROM US/SPECIALIST IN TWO WEEKS, PLEASE GIVE [place] A CALL AT [phone] X [phone].   THE PATIENT IS ENCOURAGED TO PRACTICE SOCIAL DISTANCING DUE TO THE COVID-19 PANDEMIC.

## 2021-03-31 NOTE — Patient Instructions (Signed)

## 2021-03-31 NOTE — Progress Notes (Signed)
This visit occurred during the SARS-CoV-2 public health emergency.  Safety protocols were in place, including screening questions prior to the visit, additional usage of staff PPE, and extensive cleaning of exam room while observing appropriate contact time as indicated for disinfecting solutions.  Subjective:   Edward Gallagher is a 80 y.o. male who presents for Medicare Annual/Subsequent preventive examination.  Review of Systems     Cardiac Risk Factors include: advanced age (>25mn, >>84women);male gender;sedentary lifestyle     Objective:    Today's Vitals   03/31/21 1007 03/31/21 1008  BP: 114/66   Pulse: 81   Temp: 98.6 F (37 C)   TempSrc: Oral   Weight: 238 lb (108 kg)   Height: _0  (1.905 m)   PainSc:  5    Body mass index is 29.75 kg/m.  Advanced Directives 03/31/2021 03/05/2021 03/25/2020 12/16/2019 05/08/2019  Does Patient Have a Medical Advance Directive? _1   Would patient like information on creating a medical advance directive? No - Patient declined No - Patient declined No - Patient declined - Yes (MAU/Ambulatory/Procedural Areas - Information given)    Current Medications (verified) Outpatient Encounter Medications as of 03/31/2021  Medication Sig  . atorvastatin (LIPITOR) 40 MG tablet Take 1 tablet (40 mg total) by mouth daily.  .Marland KitchenELIQUIS 5 MG TABS tablet TAKE 1 TABLET(5 MG) BY MOUTH TWICE DAILY  . finasteride (PROSCAR) 5 MG tablet Take 5 mg by mouth daily.  . Multiple Vitamin (MULTIVITAMIN) tablet Take 1 tablet by mouth daily.  . nitroGLYCERIN (NITROSTAT) 0.4 MG SL tablet Place 1 tablet (0.4 mg total) under the tongue every 5 (five) minutes as needed for chest pain.  .Marland Kitchenterazosin (HYTRIN) 5 MG capsule TAKE 1 CAPSULE BY MOUTH  DAILY AT BEDTIME  . tiZANidine (ZANAFLEX) 2 MG tablet Take 1 tablet (2 mg total) by mouth every 8 (eight) hours as needed for muscle spasms.   No facility-administered encounter medications on file as of 03/31/2021.     Allergies (verified) Patient has no known allergies.   History: Past Medical History:  Diagnosis Date  . A-fib ([place]   . Allergy   . Hyperlipidemia   . Hypertension   . Multiple myeloma ([place]   . Prostatic hypertrophy   . Sinus trouble   . Sleep apnea    Past Surgical History:  Procedure Laterality Date  . HERNIA REPAIR    . VASECTOMY     Family History  Problem Relation Age of Onset  . Hypertension Other   . Prostate cancer Father   . Testicular cancer Brother   . Prostate cancer Brother   . Lung cancer Brother   . Cancer Brother        double mynoma  . Healthy Mother    Social History   Socioeconomic History  . Marital status: Married    Spouse name: Not on file  . Number of children: 0  . Years of education: Not on file  . Highest education level: Not on file  Occupational History  . Occupation: retired  Tobacco Use  . Smoking status: Former Smoker    Packs/day: 0.50    Years: 35.00    Pack years: 17.50    Types: Cigarettes    Quit date: 12/12/1997    Years since quitting: 23.3  . Smokeless tobacco: Never Used  . Tobacco comment: 12 cigs daily  Vaping Use  . Vaping Use: Never used  Substance and Sexual Activity  . Alcohol  use: Yes    Comment: ocasional beer  . Drug use: No  . Sexual activity: Not Currently  Other Topics Concern  . Not on file  Social History Narrative  . Not on file   Social Determinants of Health   Financial Resource Strain: Low Risk   . Difficulty of Paying Living Expenses: Not hard at all  Food Insecurity: No Food Insecurity  . Worried About Charity fundraiser in the Last Year: Never true  . Ran Out of Food in the Last Year: Never true  Transportation Needs: No Transportation Needs  . Lack of Transportation (Medical): No  . Lack of Transportation (Non-Medical): No  Physical Activity: Inactive  . Days of Exercise per Week: 0 days  . Minutes of Exercise per Session: 0 min  Stress: No Stress Concern Present  .  Feeling of Stress : Not at all  Social Connections: Not on file    Tobacco Counseling Counseling given: Not Answered Comment: 12 cigs daily   Clinical Intake:  Pre-visit preparation completed: Yes  Pain : 0-10 Pain Score: 5  Pain Location: Hip Pain Orientation: Left Pain Descriptors / Indicators: Aching Pain Onset: More than a month ago Pain Frequency: Constant     Nutritional Status: BMI 25 -29 Overweight Nutritional Risks: None Diabetes: No  How often do you need to have someone help you when you read instructions, pamphlets, or other written materials from your doctor or pharmacy?: 1 - Never What is the last grade level you completed in school?: 14 years  Diabetic? no     Information entered by :: NAllen LPN   Activities of Daily Living In your present state of health, do you have any difficulty performing the following activities: 03/31/2021 03/31/2021  Hearing? N N  Vision? N N  Difficulty concentrating or making decisions? N N  Walking or climbing stairs? Y Y  Dressing or bathing? N N  Doing errands, shopping? N N  Preparing Food and eating ? N -  Using the Toilet? N -  In the past six months, have you accidently leaked urine? N -  Do you have problems with loss of bowel control? N -  Managing your Medications? N -  Managing your Finances? N -  Housekeeping or managing your Housekeeping? N -  Some recent data might be hidden    Patient Care Team: Glendale Chard, MD as PCP - General (Internal Medicine)  Indicate any recent Medical Services you may have received from other than [facility] providers in the past year (date may be approximate).     Assessment:   This is a routine wellness examination for Edward Gallagher.  Hearing/Vision screen  Hearing Screening   _0  _1  _2  _3  _4  _5  _6  _7  _8   Right ear:           Left ear:           Vision Screening Comments: Regular eye exams, [facility]  Dietary issues and exercise  activities discussed: Current Exercise Habits: The patient does not participate in regular exercise at present  Goals    . Weight (lb) < 200 lb (90.7 kg)     Wants to lose 20 pounds    . Weight (lb) < 200 lb (90.7 kg)     03/25/2020, wants to get to 220 pounds      Depression Screen PHQ 2/9 Scores 03/31/2021 03/31/2021 03/25/2020 09/25/2019 07/25/2019 05/08/2019 02/18/2019  PHQ - 2 Score 0 0 0 0 0 0 0  PHQ- 9 Score - - - - - 0 -    Fall Risk Fall Risk  03/31/2021 03/31/2021 03/25/2020 09/25/2019 07/25/2019  Falls in the past year? 0 0 0 0 0  Risk for fall due to : Medication side effect - Medication side effect - -  Follow up Falls evaluation completed;Education provided;Falls prevention discussed - Education provided;Falls prevention discussed;Falls evaluation completed - -    FALL RISK PREVENTION PERTAINING TO THE HOME:  Any stairs in or around the home? No  If so, are there any without handrails? n/a Home free of loose throw rugs in walkways, pet beds, electrical cords, etc? Yes  Adequate lighting in your home to reduce risk of falls? Yes   ASSISTIVE DEVICES UTILIZED TO PREVENT FALLS:  Life alert? No  Use of a cane, walker or w/c? Yes  Grab bars in the bathroom? Yes  Shower chair or bench in shower? No  Elevated toilet seat or a handicapped toilet? No   TIMED UP AND GO:  Was the test performed? No .    Gait slow and steady with assistive device  Cognitive Function:     6CIT Screen 03/31/2021 03/25/2020 05/08/2019  What Year? 0 points 0 points 0 points  What month? 0 points 0 points 0 points  What time? 0 points 0 points 0 points  Count back from 20 0 points 4 points 0 points  Months in reverse 0 points 0 points 0 points  Repeat phrase 2 points 0 points 0 points  Total Score 2 4 0    Immunizations Immunization History  Administered Date(s) Administered  . Fluad Quad(high Dose 65+) 08/15/2020  . Influenza, High Dose Seasonal PF 08/14/2019  . Influenza-Unspecified  08/13/2015, 08/20/2018, 08/16/2019  . PFIZER(Purple Top)SARS-COV-2 Vaccination 01/03/2020, 01/24/2020, 09/14/2020  . Pneumococcal Conjugate-13 08/13/2015  . Zoster 12/12/2013  . Zoster Recombinat (Shingrix) 08/14/2019, 10/13/2019    TDAP status: Up to date  Flu Vaccine status: Up to date  Pneumococcal vaccine status: Up to date  Covid-19 vaccine status: Completed vaccines  Qualifies for Shingles Vaccine? Yes   Zostavax completed Yes   Shingrix Completed?: Yes  Screening Tests Health Maintenance  Topic Date Due  . COVID-19 Vaccine (4 - Booster for Pfizer series) 03/15/2021  . PNA vac Low Risk Adult (2 of 2 - PPSV23) 03/31/2022 (Originally 08/12/2016)  . INFLUENZA VACCINE  07/12/2021  . TETANUS/TDAP  12/25/2022  . Hepatitis C Screening  Completed  . HPV VACCINES  Aged Out    Health Maintenance  Health Maintenance Due  Topic Date Due  . COVID-19 Vaccine (4 - Booster for Pfizer series) 03/15/2021    Colorectal cancer screening: No longer required.   Lung Cancer Screening: (Low Dose CT Chest recommended if Age 20-80 years, 30 pack-year currently smoking OR have quit w/in 15years.) does not qualify.   Lung Cancer Screening Referral: no  Additional Screening:  Hepatitis C Screening: does qualify; Completed 10/06/2020  Vision Screening: Recommended annual ophthalmology exams for early detection of glaucoma and other disorders of the eye. Is the patient up to date with their annual eye exam?  Yes  Who is the provider or what is the name of the office in which the patient attends annual eye exams? [facility] If pt is not established with a provider, would they like to be referred to a provider to establish care? No .   Dental Screening: Recommended annual dental exams for proper oral hygiene  Community Resource Referral / Chronic Care Management: CRR  required this visit?  No   CCM required this visit?  No      Plan:     I have personally reviewed and noted  the following in the patient's chart:   . Medical and social history . Use of alcohol, tobacco or illicit drugs  . Current medications and supplements . Functional ability and status . Nutritional status . Physical activity . Advanced directives . List of other physicians . Hospitalizations, surgeries, and ER visits in previous 12 months . Vitals . Screenings to include cognitive, depression, and falls . Referrals and appointments  In addition, I have reviewed and discussed with patient certain preventive protocols, quality metrics, and best practice recommendations. A written personalized care plan for preventive services as well as general preventive health recommendations were provided to patient.     Kellie Simmering, LPN   3/76/2831   Nurse Notes:

## 2021-04-06 DIAGNOSIS — C9000 Multiple myeloma not having achieved remission: Secondary | ICD-10-CM | POA: Diagnosis not present

## 2021-04-13 DIAGNOSIS — C9000 Multiple myeloma not having achieved remission: Secondary | ICD-10-CM | POA: Diagnosis not present

## 2021-04-20 DIAGNOSIS — Z7952 Long term (current) use of systemic steroids: Secondary | ICD-10-CM | POA: Diagnosis not present

## 2021-04-20 DIAGNOSIS — Z7901 Long term (current) use of anticoagulants: Secondary | ICD-10-CM | POA: Diagnosis not present

## 2021-04-20 DIAGNOSIS — R768 Other specified abnormal immunological findings in serum: Secondary | ICD-10-CM | POA: Diagnosis not present

## 2021-04-20 DIAGNOSIS — C9000 Multiple myeloma not having achieved remission: Secondary | ICD-10-CM | POA: Diagnosis not present

## 2021-04-20 DIAGNOSIS — Z5112 Encounter for antineoplastic immunotherapy: Secondary | ICD-10-CM | POA: Diagnosis not present

## 2021-04-20 DIAGNOSIS — I10 Essential (primary) hypertension: Secondary | ICD-10-CM | POA: Diagnosis not present

## 2021-04-20 DIAGNOSIS — K5900 Constipation, unspecified: Secondary | ICD-10-CM | POA: Diagnosis not present

## 2021-04-20 DIAGNOSIS — R600 Localized edema: Secondary | ICD-10-CM | POA: Diagnosis not present

## 2021-04-20 DIAGNOSIS — K5903 Drug induced constipation: Secondary | ICD-10-CM | POA: Diagnosis not present

## 2021-04-20 DIAGNOSIS — I4891 Unspecified atrial fibrillation: Secondary | ICD-10-CM | POA: Diagnosis not present

## 2021-04-20 DIAGNOSIS — D649 Anemia, unspecified: Secondary | ICD-10-CM | POA: Diagnosis not present

## 2021-04-27 DIAGNOSIS — C9000 Multiple myeloma not having achieved remission: Secondary | ICD-10-CM | POA: Diagnosis not present

## 2021-05-04 DIAGNOSIS — C9000 Multiple myeloma not having achieved remission: Secondary | ICD-10-CM | POA: Diagnosis not present

## 2021-05-11 DIAGNOSIS — C9000 Multiple myeloma not having achieved remission: Secondary | ICD-10-CM | POA: Diagnosis not present

## 2021-05-12 ENCOUNTER — Encounter: Payer: Self-pay | Admitting: Internal Medicine

## 2021-05-13 ENCOUNTER — Encounter: Payer: Self-pay | Admitting: Internal Medicine

## 2021-05-18 DIAGNOSIS — C9000 Multiple myeloma not having achieved remission: Secondary | ICD-10-CM | POA: Diagnosis not present

## 2021-05-18 DIAGNOSIS — Z5112 Encounter for antineoplastic immunotherapy: Secondary | ICD-10-CM | POA: Diagnosis not present

## 2021-05-18 DIAGNOSIS — Z5111 Encounter for antineoplastic chemotherapy: Secondary | ICD-10-CM | POA: Diagnosis not present

## 2021-05-18 DIAGNOSIS — Z7952 Long term (current) use of systemic steroids: Secondary | ICD-10-CM | POA: Diagnosis not present

## 2021-05-18 DIAGNOSIS — D649 Anemia, unspecified: Secondary | ICD-10-CM | POA: Diagnosis not present

## 2021-05-18 DIAGNOSIS — M25552 Pain in left hip: Secondary | ICD-10-CM | POA: Diagnosis not present

## 2021-05-18 DIAGNOSIS — K5900 Constipation, unspecified: Secondary | ICD-10-CM | POA: Diagnosis not present

## 2021-05-18 DIAGNOSIS — Z7901 Long term (current) use of anticoagulants: Secondary | ICD-10-CM | POA: Diagnosis not present

## 2021-05-18 DIAGNOSIS — R600 Localized edema: Secondary | ICD-10-CM | POA: Diagnosis not present

## 2021-05-18 DIAGNOSIS — R531 Weakness: Secondary | ICD-10-CM | POA: Diagnosis not present

## 2021-05-18 DIAGNOSIS — I10 Essential (primary) hypertension: Secondary | ICD-10-CM | POA: Diagnosis not present

## 2021-05-18 DIAGNOSIS — I4891 Unspecified atrial fibrillation: Secondary | ICD-10-CM | POA: Diagnosis not present

## 2021-05-21 ENCOUNTER — Other Ambulatory Visit: Payer: Self-pay

## 2021-05-21 ENCOUNTER — Ambulatory Visit: Payer: Medicare Other | Admitting: Cardiology

## 2021-05-21 ENCOUNTER — Encounter: Payer: Self-pay | Admitting: Cardiology

## 2021-05-21 VITALS — BP 146/65 | HR 58 | Temp 98.0°F | Resp 16 | Ht 75.0 in | Wt 244.0 lb

## 2021-05-21 DIAGNOSIS — I480 Paroxysmal atrial fibrillation: Secondary | ICD-10-CM

## 2021-05-21 DIAGNOSIS — I10 Essential (primary) hypertension: Secondary | ICD-10-CM | POA: Diagnosis not present

## 2021-05-21 MED ORDER — CHLORTHALIDONE 25 MG PO TABS: 12.5000 mg | ORAL_TABLET | Freq: Every day | ORAL | 3 refills | Status: DC

## 2021-05-21 NOTE — Progress Notes (Signed)
Follow up visit  Subjective:   Edward Gallagher, male    DOB: [birth year], 80 y.o.   MRN: [id number]   HPI  80 y.o. African American male with hypertension, hyperlipidemia, OSA, paroxysmal Afib  Patient was diagnosed with multiple myeloma.  He is currently on treatment at [facility].  He has not had any recurrent GI bleeding. He denies chest pain, shortness of breath, palpitations, orthopnea, PND, TIA/syncope. He has mild leg edema.    Current Outpatient Medications on File Prior to Visit  Medication Sig Dispense Refill   Acetaminophen 500 MG capsule Take by mouth.     atorvastatin (LIPITOR) 40 MG tablet Take 1 tablet (40 mg total) by mouth daily. 90 tablet 1   calcium carbonate (OS-CAL - DOSED IN MG OF ELEMENTAL CALCIUM) 1250 (500 Ca) MG tablet Take by mouth.     Cholecalciferol 25 MCG (1000 UT) tablet Take by mouth.     dexamethasone (DECADRON) 4 MG tablet Take by mouth.     ELIQUIS 5 MG TABS tablet TAKE 1 TABLET(5 MG) BY MOUTH TWICE DAILY 180 tablet 3   ferrous sulfate 325 (65 FE) MG EC tablet Take 1 tablet by mouth daily with breakfast.     finasteride (PROSCAR) 5 MG tablet Take 5 mg by mouth daily.     lenalidomide (REVLIMID) 15 MG capsule Take by mouth.     magnesium oxide (MAG-OX) 400 MG tablet Take by mouth.     Multiple Vitamin (MULTIVITAMIN) tablet Take 1 tablet by mouth daily.     nitroGLYCERIN (NITROSTAT) 0.4 MG SL tablet Place 1 tablet (0.4 mg total) under the tongue every 5 (five) minutes as needed for chest pain. 100 tablet 3   prochlorperazine (COMPAZINE) 5 MG tablet Take by mouth.     terazosin (HYTRIN) 5 MG capsule TAKE 1 CAPSULE BY MOUTH  DAILY AT BEDTIME 90 capsule 3   tiZANidine (ZANAFLEX) 2 MG tablet Take 1 tablet (2 mg total) by mouth every 8 (eight) hours as needed for muscle spasms. 30 tablet 0   traMADol (ULTRAM) 50 MG tablet Take by mouth.     No current facility-administered medications on file prior to visit.    Cardiovascular & other pertient  studies:  EKG 05/21/2021: Sinus rhythm 58 bpm Normal EKG  Calcium score 01/2020: - Total Calcium Score: 38. -- Left Main: 29. -- Right Coronary: 0 -- Left Anterior Descending: 9. -- Circumflex: 0.  Lexiscan (Walking with mod Bruce)Tetrofosmin Stress Test  12/25/2019: Nondiagnostic ECG stress. Normal myocardial perfusion. Stress LV EF is mildly depressed at 42% with global hypokinesis.  No previous exam available for comparison. 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 Correlate with echocardiogram. Intermediate risk study due to low LVEF.   Abdominal Aortic Duplex  01/21/2020:  The maximum aorta (sac) diameter is 2.35 cm (prox). Diffuse calcific  plaque observed in the proximal, mid and distal aorta. Normal flow  velocities noted.  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  Normal flow velocity in the aorta and bilateral IIA.  Consider rescan in 10 years.   Carotid artery duplex  11/15/2019: Minimal stenosis in the right internal carotid artery (minimal). Minimal stenosis in the left internal carotid artery (minimal). Mild soft plaque bilateral carotid arteries. Antegrade right vertebral artery flow. Antegrade left vertebral artery flow.  Echocardiogram 11/15/2019: Left ventricle cavity is normal in size. Moderate concentric hypertrophy of the left ventricle. Normal LV systolic function with visual EF 50-55%. Normal global  wall motion. Normal diastolic filling pattern. Trileaflet aortic valve.  Mild to moderate aortic regurgitation. Moderate (Grade II) mitral regurgitation. No evidence of pulmonary hypertension.  Event monitor 11/02/2019 - 12/01/2019: Diagnostic time: 100%  Dominant rhythm: Sinus. HR 36-136 bpm. Avg HR 64 bpm. Episodes of atrial fibrillation with RVR noted. Afib burden <1% NSVT up to 4 beats noted.  Sinus bradycardia with lowest HR 36 bpm, typically occurs during sleep hours.  Occasional PAC/PVC seen. No  atrial flutter/SVT/high grade AV block, sinus pause >3sec noted. Symptoms reported: None   Recent labs: 05/18/2021: Glucose 88, BUN/Cr 14/1.0. EGFR 77. Na/K 141/4.8. Rest of the CMP normal H/H 8.5/28. MCV 83.   03/15/2021: Glucose 96, BUN/Cr 13/1.34. EGFR 54. Na/K 146/4.9. Rest of the CMP normal H/H 9.8/31.9. MCV 82. Platelets 247  10/06/2020: Glucose 101, BUN/Cr 17/1.28. EGFR 62. Na/K 142/4.3. Rest of the CMP normal H/H 10/32. MCV 80. Platelets 242 Chol 154, TG 86, HDL 53, LDL 85  12/16/2019: Glucose 106, BUN/Cr 16/1.2. EGFR >60. Na/K 138/3.8. Rest of the CMP normal H/H 11.5/35.9. MCV 77.9. Platelets 214 HbA1C 6.0% Troponin I 6  02/2019: Chol 157, TG 75, HDL 52, LDL 91   Review of Systems  Cardiovascular:  Positive for leg swelling. Negative for chest pain, dyspnea on exertion, palpitations and syncope.        Vitals:   05/21/21 0922  BP: (!) 146/65  Pulse: (!) 58  Resp: 16  Temp: 98 F (36.7 C)  SpO2: 95%      Objective:   Physical Exam Vitals and nursing note reviewed.  Constitutional:      General: He is not in acute distress. Neck:     Vascular: No JVD.  Cardiovascular:     Rate and Rhythm: Normal rate and regular rhythm.     Heart sounds: Normal heart sounds. No murmur heard. Pulmonary:     Effort: Pulmonary effort is normal.     Breath sounds: Normal breath sounds. No wheezing or rales.  Musculoskeletal:     Right lower leg: Edema (Trace) present.     Left lower leg: Edema (Trace) present.        Assessment & Recommendations:    80 y.o. African American male with hypertension, hyperlipidemia, OSA, paroxysmal Afib  Paroxysmal Afib: Afib burden <1%. Resting HR during sleep in 30s. Given symptoms are infrequent, would avoid scheduled AV nodal blocking agent or antiarrythmic therapy. No ischemia on stress test. Calcium score 29 (01/2020). Emphasized the use of CPAP.  CHA2DS2VASc score 3, annual stroke risk 3.2%. Anemia with hemoglobin 8.5 in the  setting of multiple myeloma.  No recent GI bleeding.  Tolerating apixaban.    Hypertension: Started chlorthalidone 12.5 mg daily.  Elevated calcium score: No significant angina symptoms at this time.  Continue risk factor modification, including statin.  Due to ongoing use of Eliquis, do not recommend aspirin.  Aorta ectasia: Mild  F/u in 6 months   Uldine Fuster Esther Hardy, MD [facility] Pager: [phone] Office: [phone]

## 2021-05-25 DIAGNOSIS — C9000 Multiple myeloma not having achieved remission: Secondary | ICD-10-CM | POA: Diagnosis not present

## 2021-05-26 ENCOUNTER — Other Ambulatory Visit: Payer: Self-pay | Admitting: Cardiology

## 2021-05-26 ENCOUNTER — Telehealth: Payer: Self-pay | Admitting: Pharmacist

## 2021-05-26 ENCOUNTER — Other Ambulatory Visit: Payer: Self-pay | Admitting: Pharmacist

## 2021-05-26 DIAGNOSIS — I10 Essential (primary) hypertension: Secondary | ICD-10-CM

## 2021-05-26 MED ORDER — ISOSORB DINITRATE-HYDRALAZINE 20-37.5 MG PO TABS: 1.0000 | ORAL_TABLET | Freq: Three times a day (TID) | ORAL | 1 refills | Status: DC

## 2021-05-26 NOTE — Telephone Encounter (Signed)
Given 50% increase with only low dose chlorthalidone 12.5 mg daily, I recommend stopping it. Recommend increased hydration and repeat BMP check in 1 week (Will order). Recommend amlodipine 5 mg daily, instead.  Thanks MJP

## 2021-05-26 NOTE — Telephone Encounter (Signed)
Reviewed with pt. Pt reports that he currently has persistent lower extremity edema secondary to being on Revlimid and Dexamethasone. Reports that the edema has been bothersome and requires pt to wear compression stockings daily and elevating his feet. Amlodipine may possibly exacerbate pt's concerns of worsening edema and thus pt hesitant. Pt agreeable to try BiDil 20/37.5 mg TID and monitoring his BP readings at home. Rx pended. Pt is already scheduled to get blood work done at [facility] next Tuesday (06/01/21). Will follow up with pt next week to review lab results and home BP readings

## 2021-05-26 NOTE — Telephone Encounter (Signed)
Pt called to discuss recent lab results through his oncologist that showed mild Scr elevation from 1.0 to 1.5 since starting chlorthalidone 12.5 mg. Per pt, it has only been 3-4 days since starting chlorthalidone. Reports that he checks his home BP periodically, with the most recent reading of 132/66. Pt planing on getting repeat labs next week. Pt agreeable to continue current medications  and continuing to monitor home BP readings. Instructed pt to increase his fluid intake, avoid OTC NSAIDs, and avoid EtOH. Will follow up with pt next week to review home BP readings and follow up lab results with pt.

## 2021-05-28 ENCOUNTER — Telehealth: Payer: Self-pay

## 2021-05-28 NOTE — Telephone Encounter (Signed)
Telephone encounter:  Reason for call: patient called and said that he fell/passed out this morning while trying to go to the bathroom. I had him take his BP it is 106/50 HR 72, no other symptoms except he does feel dizzy   Usual provider: mp  Last office visit: 05/21/21  Next office visit: 11/22/21   Last hospitalization: na   Current Outpatient Medications on File Prior to Visit  Medication Sig Dispense Refill   Acetaminophen 500 MG capsule Take by mouth.     atorvastatin (LIPITOR) 40 MG tablet Take 1 tablet (40 mg total) by mouth daily. 90 tablet 1   calcium carbonate (OS-CAL - DOSED IN MG OF ELEMENTAL CALCIUM) 1250 (500 Ca) MG tablet Take by mouth.     Cholecalciferol 25 MCG (1000 UT) tablet Take by mouth.     dexamethasone (DECADRON) 4 MG tablet Take by mouth.     ELIQUIS 5 MG TABS tablet TAKE 1 TABLET(5 MG) BY MOUTH TWICE DAILY 180 tablet 3   ferrous sulfate 325 (65 FE) MG EC tablet Take 1 tablet by mouth daily with breakfast.     finasteride (PROSCAR) 5 MG tablet Take 5 mg by mouth daily.     isosorbide-hydrALAZINE (BIDIL) 20-37.5 MG tablet Take 1 tablet by mouth 3 (three) times daily. 90 tablet 1   lenalidomide (REVLIMID) 15 MG capsule Take by mouth.     magnesium oxide (MAG-OX) 400 MG tablet Take by mouth.     Multiple Vitamin (MULTIVITAMIN) tablet Take 1 tablet by mouth daily.     nitroGLYCERIN (NITROSTAT) 0.4 MG SL tablet Place 1 tablet (0.4 mg total) under the tongue every 5 (five) minutes as needed for chest pain. 100 tablet 3   prochlorperazine (COMPAZINE) 5 MG tablet Take by mouth.     terazosin (HYTRIN) 5 MG capsule TAKE 1 CAPSULE BY MOUTH  DAILY AT BEDTIME 90 capsule 3   tiZANidine (ZANAFLEX) 2 MG tablet Take 1 tablet (2 mg total) by mouth every 8 (eight) hours as needed for muscle spasms. 30 tablet 0   traMADol (ULTRAM) 50 MG tablet Take by mouth.     No current facility-administered medications on file prior to visit.

## 2021-05-28 NOTE — Telephone Encounter (Signed)
Agree. Thanks TJ.

## 2021-05-28 NOTE — Telephone Encounter (Signed)
Called and discussed with pt. Pt stopped chlorthalidone and started BiDil 20/37.5 mg TID starting yesterday. Took his morning BiDil tablet at ~8:45 AM. Pt reported that ~10:30 AM, while getting up from his commode, pt reported feeling lightheaded and dizzy. Pt reported that he stumbled and slipped, but was able to grab on to his shower handle to avoid falling or injuring himself. Denies any head trauma or any other significant injuries. States that he felt a little flushed and nauseous following the episode. Denies syncope, CP, SOB, loss of bowel movements, seizure. Home BP reading yesterday of 135/73 and this morning following the episode of 106/50.   Instructed pt to hold BiDil and to continue monitoring his BP readings. Encouraged pt to be cautious when changing positions, increasing his fluid intake, and holding on and resting if he has any repeat episodes. Pt agreeable to continue monitoring his home BP readings and getting lab work completed at [facility] next Tuesday. Pt to notify the office if symptoms continue to persist or worsen

## 2021-05-28 NOTE — Telephone Encounter (Signed)
Did this happen after taking Bidil? Did he lose consciousness completely? If yes, please hold Bidil. Recommend 2 week live monitor. If he did not lose consciousness, most likely due to Bidil. Monitor blood pressure for now without any antihypertensive agent.  Thanks MJP

## 2021-05-29 ENCOUNTER — Emergency Department
Admission: EM | Admit: 2021-05-29 | Discharge: 2021-05-29 | Disposition: A | Discharge status: Home / Self Care | Payer: Medicare Other | Source: Home / Self Care | Attending: Emergency Medicine | Admitting: Emergency Medicine

## 2021-05-29 ENCOUNTER — Emergency Department: Payer: Medicare Other

## 2021-05-29 ENCOUNTER — Other Ambulatory Visit: Payer: Self-pay

## 2021-05-29 ENCOUNTER — Encounter: Payer: Self-pay | Admitting: Emergency Medicine

## 2021-05-29 DIAGNOSIS — Z7952 Long term (current) use of systemic steroids: Secondary | ICD-10-CM | POA: Diagnosis not present

## 2021-05-29 DIAGNOSIS — I480 Paroxysmal atrial fibrillation: Secondary | ICD-10-CM | POA: Insufficient documentation

## 2021-05-29 DIAGNOSIS — E785 Hyperlipidemia, unspecified: Secondary | ICD-10-CM | POA: Insufficient documentation

## 2021-05-29 DIAGNOSIS — Z8579 Personal history of other malignant neoplasms of lymphoid, hematopoietic and related tissues: Secondary | ICD-10-CM | POA: Insufficient documentation

## 2021-05-29 DIAGNOSIS — D649 Anemia, unspecified: Secondary | ICD-10-CM | POA: Insufficient documentation

## 2021-05-29 DIAGNOSIS — R002 Palpitations: Secondary | ICD-10-CM

## 2021-05-29 DIAGNOSIS — Z20822 Contact with and (suspected) exposure to covid-19: Secondary | ICD-10-CM | POA: Diagnosis not present

## 2021-05-29 DIAGNOSIS — K219 Gastro-esophageal reflux disease without esophagitis: Secondary | ICD-10-CM | POA: Diagnosis not present

## 2021-05-29 DIAGNOSIS — Z87891 Personal history of nicotine dependence: Secondary | ICD-10-CM | POA: Insufficient documentation

## 2021-05-29 DIAGNOSIS — Z8679 Personal history of other diseases of the circulatory system: Secondary | ICD-10-CM | POA: Insufficient documentation

## 2021-05-29 DIAGNOSIS — Z79891 Long term (current) use of opiate analgesic: Secondary | ICD-10-CM | POA: Diagnosis not present

## 2021-05-29 DIAGNOSIS — C9000 Multiple myeloma not having achieved remission: Secondary | ICD-10-CM | POA: Insufficient documentation

## 2021-05-29 DIAGNOSIS — Z9221 Personal history of antineoplastic chemotherapy: Secondary | ICD-10-CM | POA: Insufficient documentation

## 2021-05-29 DIAGNOSIS — I4891 Unspecified atrial fibrillation: Secondary | ICD-10-CM | POA: Diagnosis not present

## 2021-05-29 DIAGNOSIS — I10 Essential (primary) hypertension: Secondary | ICD-10-CM | POA: Insufficient documentation

## 2021-05-29 DIAGNOSIS — R079 Chest pain, unspecified: Secondary | ICD-10-CM | POA: Diagnosis not present

## 2021-05-29 DIAGNOSIS — Z79899 Other long term (current) drug therapy: Secondary | ICD-10-CM | POA: Insufficient documentation

## 2021-05-29 DIAGNOSIS — Z7901 Long term (current) use of anticoagulants: Secondary | ICD-10-CM | POA: Insufficient documentation

## 2021-05-29 DIAGNOSIS — N400 Enlarged prostate without lower urinary tract symptoms: Secondary | ICD-10-CM | POA: Diagnosis not present

## 2021-05-29 DIAGNOSIS — R55 Syncope and collapse: Principal | ICD-10-CM | POA: Insufficient documentation

## 2021-05-29 LAB — BASIC METABOLIC PANEL
Anion gap: 7 (ref 5–15)
BUN: 23 mg/dL (ref 8–23)
CO2: 27 mmol/L (ref 22–32)
Calcium: 8.1 mg/dL — ABNORMAL LOW (ref 8.9–10.3)
Chloride: 102 mmol/L (ref 98–111)
Creatinine, Ser: 1.36 mg/dL — ABNORMAL HIGH (ref 0.61–1.24)
GFR, Estimated: 53 mL/min — ABNORMAL LOW (ref >60)
Glucose, Bld: 114 mg/dL — ABNORMAL HIGH (ref 70–99)
Potassium: 3.5 mmol/L (ref 3.5–5.1)
Sodium: 136 mmol/L (ref 135–145)

## 2021-05-29 LAB — CBC
HCT: 28.4 % — ABNORMAL LOW (ref 39.0–52.0)
Hemoglobin: 8.7 g/dL — ABNORMAL LOW (ref 13.0–17.0)
MCH: 24.5 pg — ABNORMAL LOW (ref 26.0–34.0)
MCHC: 30.6 g/dL (ref 30.0–36.0)
MCV: 80 fL (ref 80.0–100.0)
Platelets: 140 10*3/uL — ABNORMAL LOW (ref 150–400)
RBC: 3.55 MIL/uL — ABNORMAL LOW (ref 4.22–5.81)
RDW: 16.7 % — ABNORMAL HIGH (ref 11.5–15.5)
WBC: 2.7 10*3/uL — ABNORMAL LOW (ref 4.0–10.5)
nRBC: 0 % (ref 0.0–0.2)

## 2021-05-29 LAB — TROPONIN I (HIGH SENSITIVITY): Troponin I (High Sensitivity): 9 ng/L (ref <18)

## 2021-05-29 NOTE — ED Triage Notes (Signed)
Pt to ED via POV with c/o palpations since 0900 this morning, pt states hx of A-fib at this time. Pt states recently started on a new BP medication, c/o L thigh pain from fall yesterday while using the bathroom. Pt states is currently undergoing treatment for multiple myeloma, is doing oral chemo at home, last dose Wednesday. Pt states does take a blood thinner for A-fib.

## 2021-05-29 NOTE — ED Notes (Signed)
No signature pad in room to sign for d/c. Patient verbally consented MD Kinner answered all questions for discharge

## 2021-05-29 NOTE — ED Provider Notes (Signed)
[facility] Emergency Department Provider Note   ____________________________________________    I have reviewed the triage vital signs and the nursing notes.   HISTORY  Chief Complaint Palpitations     HPI Edward Gallagher is a 80 y.o. male with history of paroxysmal atrial fibrillation, additional history as detailed below who presents with complaints of palpitations.  Patient reports that approximately 7 AM this morning he felt palpitations, these have since resolved, lasted approximately 15 minutes.  No chest pain.  No nausea or diaphoresis.  No shortness of breath calf pain or swelling.  Feels well now.  Is undergoing chemotherapy for multiple myeloma.  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 Authorizing Provider  Acetaminophen 500 MG capsule Take by mouth.    [provider]  atorvastatin (LIPITOR) 40 MG tablet Take 1 tablet (40 mg total) by mouth daily. 03/31/21   Dorothyann Peng, MD  calcium carbonate (OS-CAL - DOSED IN MG OF ELEMENTAL CALCIUM) 1250 (500 Ca) MG tablet Take by mouth.    [provider]  Cholecalciferol 25  MCG (1000 UT) tablet Take by mouth.    [provider]  dexamethasone (DECADRON) 4 MG tablet Take by mouth. 03/31/21   [provider]  ELIQUIS 5 MG TABS tablet TAKE 1 TABLET(5 MG) BY MOUTH TWICE DAILY 03/19/21   Patwardhan, Anabel Bene, MD  ferrous sulfate 325 (65 FE) MG EC tablet Take 1 tablet by mouth daily with breakfast. 05/18/21 05/18/22  [provider]  finasteride (PROSCAR) 5 MG tablet Take 5 mg by mouth daily.    [provider]  isosorbide-hydrALAZINE (BIDIL) 20-37.5 MG tablet Take 1 tablet by mouth 3 (three) times daily. 05/26/21   Patwardhan, Anabel Bene, MD  lenalidomide (REVLIMID) 15 MG capsule Take by mouth. 05/11/21 06/01/21  [provider]  magnesium oxide (MAG-OX) 400 MG tablet Take by mouth.    [provider]  Multiple Vitamin (MULTIVITAMIN) tablet Take 1 tablet by mouth daily.    [provider]  nitroGLYCERIN (NITROSTAT) 0.4 MG SL tablet Place 1 tablet (0.4 mg total) under the tongue every 5 (five) minutes as needed for chest pain. 03/31/21 03/31/22  Dorothyann Peng, MD  prochlorperazine (COMPAZINE) 5 MG tablet Take by mouth. 03/17/21   [provider]  terazosin (HYTRIN) 5 MG capsule TAKE 1 CAPSULE BY MOUTH  DAILY AT BEDTIME 07/30/20   Dorothyann Peng, MD  tiZANidine (ZANAFLEX) 2 MG tablet Take 1 tablet (2 mg total) by mouth every 8 (eight) hours as needed for muscle spasms. 12/17/20   Charlesetta Ivory, NP  traMADol (ULTRAM) 50 MG tablet Take by mouth. 04/13/21   [provider]     Allergies Patient has no known allergies.  Family History  Problem Relation Age of Onset   Healthy Mother    Prostate cancer Father    Testicular cancer Brother    Prostate cancer Brother    Lung cancer Brother    Cancer Brother        double mynoma   Hypertension Other     Social History Social History   Tobacco Use   Smoking status: Former    Packs/day: 0.50    Years: 35.00    Pack years: 17.50    Types: Cigarettes     Quit date: 12/12/1997    Years since quitting: 23.4   Smokeless tobacco: Never   Tobacco comments:    12 cigs daily  Vaping Use   Vaping Use: Never used  Substance Use Topics   Alcohol use: Yes    Comment: ocasional beer   Drug use: No    Review of Systems  Constitutional: No fever/chills Eyes: No visual changes.  ENT: No sore throat. Cardiovascular: Denies chest pain.  As above Respiratory: Denies shortness of breath. Gastrointestinal: No abdominal pain.  No nausea, no vomiting.   Genitourinary: Negative for dysuria. Musculoskeletal: Negative for back pain. Skin: Negative for rash. Neurological: Negative for headaches or weakness   ____________________________________________   PHYSICAL EXAM:  VITAL SIGNS: ED Triage Vitals [05/29/21 1032]  Enc Vitals Group     BP (!) 132/54     Pulse Rate 71     Resp (!) 22     Temp 98.8 F (37.1 C)     Temp Source Oral     SpO2 98 %     Weight 107.5 kg (237 lb)     Height 1.905 m (_0 )     Head Circumference      Peak Flow      Pain Score 2     Pain Loc      Pain Edu?      Excl. in [place]?     Constitutional: Alert and oriented. No acute distress. Pleasant and interactive  Nose: No congestion/rhinnorhea. Mouth/Throat: Mucous membranes are moist.    Cardiovascular: Normal rate, regular rhythm. Grossly normal heart sounds.  Good peripheral circulation. Respiratory: Normal respiratory effort.  No retractions. Lungs CTAB. Gastrointestinal: Soft and nontender. No distention.  No CVA tenderness. Genitourinary: deferred Musculoskeletal: No lower extremity tenderness nor edema.  Warm and well perfused Neurologic:  Normal speech and language. No gross focal neurologic deficits are appreciated.  Skin:  Skin is warm, dry and intact. No rash noted. Psychiatric: Mood and affect are normal. Speech and behavior are normal.  ____________________________________________   LABS (all labs ordered are listed, but only abnormal results  are displayed)  Labs Reviewed  BASIC METABOLIC PANEL - Abnormal; Notable for the following components:      Result Value   Glucose, Bld 114 (*)    Creatinine, Ser 1.36 (*)    Calcium 8.1 (*)    GFR, Estimated 53 (*)    All other components within normal limits  CBC - Abnormal; Notable for the following components:   WBC 2.7 (*)    RBC 3.55 (*)    Hemoglobin 8.7 (*)    HCT 28.4 (*)    MCH 24.5 (*)    RDW 16.7 (*)    Platelets 140 (*)    All other components within normal limits  TROPONIN I (HIGH SENSITIVITY)   ____________________________________________  EKG  ED ECG REPORT I, Lavonia Drafts, the attending physician, personally viewed and interpreted this ECG.  Date: 05/29/2021  Rhythm: normal sinus rhythm QRS Axis: normal Intervals: normal ST/T Wave abnormalities: normal Narrative Interpretation: no evidence of acute ischemia  ____________________________________________  RADIOLOGY  Chest x-ray read by me, no acute abnormality ____________________________________________   PROCEDURES  Procedure(s) performed: No  Procedures   Critical Care performed: No ____________________________________________   INITIAL IMPRESSION / ASSESSMENT AND PLAN / ED COURSE  Pertinent labs & imaging results that were available during my care of the patient were reviewed by me and considered in my medical decision making (see chart for details).   Patient overall well-appearing and asymptomatic at this time, vital signs are reassuring.  EKG without evidence of atrial fibrillation or arrhythmia.  Lab work reassuring, normal high sensitive troponin, minimal elevation of BUN/creatinine ratio.  Slightly decreased white blood cell count hemoglobin and platelets as expected  No negation for admission at this time, discharge with outpatient follow-up with his cardiologist.  Return precautions discussed.    ____________________________________________   FINAL CLINICAL  IMPRESSION(S) / ED DIAGNOSES  Final diagnoses:  Palpitations        Note:  This document was prepared using Dragon voice recognition software and may include unintentional dictation errors.    Lavonia Drafts, MD 05/29/21 1536

## 2021-05-30 ENCOUNTER — Observation Stay
Admit: 2021-05-30 | Discharge: 2021-05-30 | Disposition: A | Discharge status: Home / Self Care | Payer: Medicare Other | Attending: Family Medicine | Admitting: Family Medicine

## 2021-05-30 ENCOUNTER — Observation Stay
Admission: EM | Admit: 2021-05-30 | Discharge: 2021-05-30 | Disposition: A | Discharge status: Home / Self Care | Payer: Medicare Other | Attending: Internal Medicine | Admitting: Internal Medicine

## 2021-05-30 ENCOUNTER — Emergency Department: Payer: Medicare Other

## 2021-05-30 ENCOUNTER — Observation Stay: Payer: Medicare Other

## 2021-05-30 ENCOUNTER — Other Ambulatory Visit: Payer: Self-pay

## 2021-05-30 DIAGNOSIS — E785 Hyperlipidemia, unspecified: Secondary | ICD-10-CM

## 2021-05-30 DIAGNOSIS — D649 Anemia, unspecified: Secondary | ICD-10-CM

## 2021-05-30 DIAGNOSIS — C9001 Multiple myeloma in remission: Secondary | ICD-10-CM

## 2021-05-30 DIAGNOSIS — R079 Chest pain, unspecified: Secondary | ICD-10-CM | POA: Diagnosis not present

## 2021-05-30 DIAGNOSIS — R55 Syncope and collapse: Secondary | ICD-10-CM | POA: Diagnosis not present

## 2021-05-30 DIAGNOSIS — I6523 Occlusion and stenosis of bilateral carotid arteries: Secondary | ICD-10-CM | POA: Diagnosis not present

## 2021-05-30 DIAGNOSIS — R002 Palpitations: Secondary | ICD-10-CM | POA: Diagnosis not present

## 2021-05-30 DIAGNOSIS — I10 Essential (primary) hypertension: Secondary | ICD-10-CM | POA: Diagnosis not present

## 2021-05-30 DIAGNOSIS — I4891 Unspecified atrial fibrillation: Secondary | ICD-10-CM | POA: Diagnosis not present

## 2021-05-30 LAB — BASIC METABOLIC PANEL
Anion gap: 4 — ABNORMAL LOW (ref 5–15)
Anion gap: 4 — ABNORMAL LOW (ref 5–15)
BUN: 24 mg/dL — ABNORMAL HIGH (ref 8–23)
BUN: 27 mg/dL — ABNORMAL HIGH (ref 8–23)
CO2: 26 mmol/L (ref 22–32)
CO2: 27 mmol/L (ref 22–32)
Calcium: 7.3 mg/dL — ABNORMAL LOW (ref 8.9–10.3)
Calcium: 7.9 mg/dL — ABNORMAL LOW (ref 8.9–10.3)
Chloride: 106 mmol/L (ref 98–111)
Chloride: 108 mmol/L (ref 98–111)
Creatinine, Ser: 1.24 mg/dL (ref 0.61–1.24)
Creatinine, Ser: 1.57 mg/dL — ABNORMAL HIGH (ref 0.61–1.24)
GFR, Estimated: 45 mL/min — ABNORMAL LOW (ref >60)
GFR, Estimated: 59 mL/min — ABNORMAL LOW (ref >60)
Glucose, Bld: 114 mg/dL — ABNORMAL HIGH (ref 70–99)
Glucose, Bld: 133 mg/dL — ABNORMAL HIGH (ref 70–99)
Potassium: 3.4 mmol/L — ABNORMAL LOW (ref 3.5–5.1)
Potassium: 3.5 mmol/L (ref 3.5–5.1)
Sodium: 137 mmol/L (ref 135–145)
Sodium: 138 mmol/L (ref 135–145)

## 2021-05-30 LAB — URINALYSIS, COMPLETE (UACMP) WITH MICROSCOPIC
Bacteria, UA: NONE SEEN
Bilirubin Urine: NEGATIVE
Glucose, UA: NEGATIVE mg/dL
Hgb urine dipstick: NEGATIVE
Ketones, ur: NEGATIVE mg/dL
Leukocytes,Ua: NEGATIVE
Nitrite: NEGATIVE
Protein, ur: NEGATIVE mg/dL
Specific Gravity, Urine: 1.014 (ref 1.005–1.030)
pH: 5 (ref 5.0–8.0)

## 2021-05-30 LAB — CBC
HCT: 24.5 % — ABNORMAL LOW (ref 39.0–52.0)
HCT: 28.4 % — ABNORMAL LOW (ref 39.0–52.0)
Hemoglobin: 7.5 g/dL — ABNORMAL LOW (ref 13.0–17.0)
Hemoglobin: 8.6 g/dL — ABNORMAL LOW (ref 13.0–17.0)
MCH: 24.5 pg — ABNORMAL LOW (ref 26.0–34.0)
MCH: 24.6 pg — ABNORMAL LOW (ref 26.0–34.0)
MCHC: 30.3 g/dL (ref 30.0–36.0)
MCHC: 30.6 g/dL (ref 30.0–36.0)
MCV: 80.3 fL (ref 80.0–100.0)
MCV: 80.9 fL (ref 80.0–100.0)
Platelets: 118 10*3/uL — ABNORMAL LOW (ref 150–400)
Platelets: 150 10*3/uL (ref 150–400)
RBC: 3.05 MIL/uL — ABNORMAL LOW (ref 4.22–5.81)
RBC: 3.51 MIL/uL — ABNORMAL LOW (ref 4.22–5.81)
RDW: 16.7 % — ABNORMAL HIGH (ref 11.5–15.5)
RDW: 16.8 % — ABNORMAL HIGH (ref 11.5–15.5)
WBC: 3.2 10*3/uL — ABNORMAL LOW (ref 4.0–10.5)
WBC: 3.6 10*3/uL — ABNORMAL LOW (ref 4.0–10.5)
nRBC: 0 % (ref 0.0–0.2)
nRBC: 0 % (ref 0.0–0.2)

## 2021-05-30 LAB — ECHOCARDIOGRAM COMPLETE
AR max vel: 3.33 cm2
AV Area VTI: 3.33 cm2
AV Area mean vel: 2.94 cm2
AV Mean grad: 5 mmHg
AV Peak grad: 11.3 mmHg
Ao pk vel: 1.68 m/s
Height: 75 in
P 1/2 time: 494 msec
S' Lateral: 3.4 cm
Weight: 3774.28 oz

## 2021-05-30 LAB — CBG MONITORING, ED: Glucose-Capillary: 115 mg/dL — ABNORMAL HIGH (ref 70–99)

## 2021-05-30 LAB — TSH: TSH: 2.771 u[IU]/mL (ref 0.350–4.500)

## 2021-05-30 LAB — RESP PANEL BY RT-PCR (FLU A&B, COVID) ARPGX2
Influenza A by PCR: NEGATIVE
Influenza B by PCR: NEGATIVE
SARS Coronavirus 2 by RT PCR: NEGATIVE

## 2021-05-30 LAB — TROPONIN I (HIGH SENSITIVITY)
Troponin I (High Sensitivity): 11 ng/L (ref <18)
Troponin I (High Sensitivity): 11 ng/L (ref <18)

## 2021-05-30 LAB — MAGNESIUM: Magnesium: 2.5 mg/dL — ABNORMAL HIGH (ref 1.7–2.4)

## 2021-05-30 MED ORDER — ONDANSETRON HCL 4 MG PO TABS: 4.0000 mg | ORAL_TABLET | Freq: Four times a day (QID) | ORAL | Status: DC | PRN

## 2021-05-30 MED ORDER — ADULT MULTIVITAMIN W/MINERALS CH
1.0000 | ORAL_TABLET | Freq: Every day | ORAL | Status: DC
  Filled 2021-05-30: qty 1

## 2021-05-30 MED ORDER — ONDANSETRON HCL 4 MG/2ML IJ SOLN: 4.0000 mg | Freq: Four times a day (QID) | INTRAMUSCULAR | Status: DC | PRN

## 2021-05-30 MED ORDER — ACETAMINOPHEN 325 MG PO TABS: 650.0000 mg | ORAL_TABLET | Freq: Four times a day (QID) | ORAL | Status: DC | PRN

## 2021-05-30 MED ORDER — PANTOPRAZOLE SODIUM 40 MG PO TBEC
40.0000 mg | DELAYED_RELEASE_TABLET | Freq: Every day | ORAL | Status: DC
  Filled 2021-05-30: qty 1

## 2021-05-30 MED ORDER — NITROGLYCERIN 0.4 MG SL SUBL: 0.4000 mg | SUBLINGUAL_TABLET | SUBLINGUAL | Status: DC | PRN

## 2021-05-30 MED ORDER — MAGNESIUM OXIDE -MG SUPPLEMENT 400 (240 MG) MG PO TABS
400.0000 mg | ORAL_TABLET | Freq: Every day | ORAL | Status: DC
  Filled 2021-05-30: qty 1

## 2021-05-30 MED ORDER — PROCHLORPERAZINE MALEATE 5 MG PO TABS
5.0000 mg | ORAL_TABLET | Freq: Four times a day (QID) | ORAL | Status: DC | PRN
  Filled 2021-05-30: qty 1

## 2021-05-30 MED ORDER — TRAZODONE HCL 50 MG PO TABS: 25.0000 mg | ORAL_TABLET | Freq: Every evening | ORAL | Status: DC | PRN

## 2021-05-30 MED ORDER — FERROUS SULFATE 325 (65 FE) MG PO TABS
325.0000 mg | ORAL_TABLET | Freq: Every day | ORAL | Status: DC
  Administered 2021-05-30: 325 mg via ORAL
  Filled 2021-05-30: qty 1

## 2021-05-30 MED ORDER — TERAZOSIN HCL 5 MG PO CAPS
5.0000 mg | ORAL_CAPSULE | Freq: Every day | ORAL | Status: DC
  Filled 2021-05-30: qty 1

## 2021-05-30 MED ORDER — PERFLUTREN LIPID MICROSPHERE
1.0000 mL | INTRAVENOUS | Status: AC | PRN
  Administered 2021-05-30: 3 mL via INTRAVENOUS
  Filled 2021-05-30: qty 10

## 2021-05-30 MED ORDER — TIZANIDINE HCL 2 MG PO TABS
2.0000 mg | ORAL_TABLET | Freq: Three times a day (TID) | ORAL | Status: DC | PRN
  Filled 2021-05-30: qty 1

## 2021-05-30 MED ORDER — SODIUM CHLORIDE 0.9 % IV SOLN: INTRAVENOUS | Status: DC

## 2021-05-30 MED ORDER — MAGNESIUM HYDROXIDE 400 MG/5ML PO SUSP: 30.0000 mL | Freq: Every day | ORAL | Status: DC | PRN

## 2021-05-30 MED ORDER — ATORVASTATIN CALCIUM 20 MG PO TABS
40.0000 mg | ORAL_TABLET | Freq: Every day | ORAL | Status: DC
  Filled 2021-05-30: qty 2

## 2021-05-30 MED ORDER — LENALIDOMIDE 15 MG PO CAPS: 15.0000 mg | ORAL_CAPSULE | Freq: Every day | ORAL | Status: DC

## 2021-05-30 MED ORDER — ISOSORB DINITRATE-HYDRALAZINE 20-37.5 MG PO TABS
1.0000 | ORAL_TABLET | Freq: Three times a day (TID) | ORAL | Status: DC
  Filled 2021-05-30 (×3): qty 1

## 2021-05-30 MED ORDER — ACYCLOVIR 200 MG PO CAPS
400.0000 mg | ORAL_CAPSULE | Freq: Two times a day (BID) | ORAL | Status: DC
  Administered 2021-05-30: 400 mg via ORAL
  Filled 2021-05-30 (×2): qty 2

## 2021-05-30 MED ORDER — APIXABAN 5 MG PO TABS
5.0000 mg | ORAL_TABLET | Freq: Two times a day (BID) | ORAL | Status: DC
  Administered 2021-05-30: 5 mg via ORAL
  Filled 2021-05-30: qty 1

## 2021-05-30 MED ORDER — FINASTERIDE 5 MG PO TABS
5.0000 mg | ORAL_TABLET | Freq: Every day | ORAL | Status: DC
  Filled 2021-05-30: qty 1

## 2021-05-30 MED ORDER — ACETAMINOPHEN 650 MG RE SUPP
650.0000 mg | Freq: Four times a day (QID) | RECTAL | Status: DC | PRN
Start: 2021-05-30 — End: 2021-05-30

## 2021-05-30 MED ORDER — SODIUM CHLORIDE 0.9 % IV BOLUS (SEPSIS)
1000.0000 mL | Freq: Once | INTRAVENOUS | Status: AC
  Administered 2021-05-30: 1000 mL via INTRAVENOUS

## 2021-05-30 MED ORDER — TRAMADOL HCL 50 MG PO TABS
50.0000 mg | ORAL_TABLET | Freq: Four times a day (QID) | ORAL | Status: DC | PRN
Start: 2021-05-30 — End: 2021-05-30

## 2021-05-30 NOTE — ED Triage Notes (Signed)
Pt here with complaints of a fib. Pt lost consciousness in triage during exam. Pt states has been having chest pain. Pt diaphoretic.

## 2021-05-30 NOTE — ED Notes (Signed)
XR at bedside

## 2021-05-30 NOTE — ED Notes (Signed)
Breakfast tray given. °

## 2021-05-30 NOTE — ED Provider Notes (Signed)
[facility] Emergency Department Provider Note  ____________________________________________   Event Date/Time   First MD Initiated Contact with Patient 05/30/21 0004     (approximate)  I have reviewed the triage vital signs and the nursing notes.   HISTORY  Chief Complaint No chief complaint on file.    HPI Edward Gallagher is a 80 y.o. male with history of paroxysmal A. fib, hypertension, hyperlipidemia, multiple myeloma who presents to the emergency department with palpitations that started tonight and lasted about 35 minutes and have resolved.  No associated chest pain, shortness of breath, diaphoresis, dizziness, nausea or vomiting.  Presented to the emergency department earlier today for similar symptoms and was in a normal sinus rhythm with an unremarkable work-up.  While in triage, patient had a syncopal event which was witnessed by nursing staff.  They report that he was sitting in a wheelchair and became unresponsive for about 10 seconds and diaphoretic.  Patient states he does not recall this and denies any preceding symptoms.  There was no witnessed seizure activity.  Patient denies any recent fevers, chills, cough, diarrhea.  No recent changes in medication.        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 Authorizing Provider  Acetaminophen 500 MG capsule Take by mouth.    [provider]  atorvastatin (LIPITOR) 40 MG tablet Take 1 tablet (40 mg total) by mouth daily. 03/31/21   Glendale Chard, MD  calcium carbonate (OS-CAL - DOSED IN MG OF ELEMENTAL CALCIUM) 1250 (500 Ca) MG tablet Take by mouth.    [provider]  Cholecalciferol 25 MCG (1000 UT) tablet Take by mouth.    [provider]  dexamethasone (DECADRON) 4 MG tablet Take by mouth. 03/31/21   [provider]  ELIQUIS 5 MG TABS tablet TAKE 1 TABLET(5 MG) BY MOUTH TWICE DAILY 03/19/21   Patwardhan, Reynold Bowen, MD  ferrous sulfate 325 (65 FE) MG EC tablet Take 1 tablet by mouth daily with breakfast. 05/18/21 05/18/22  [provider]  finasteride (PROSCAR) 5 MG tablet Take 5 mg by mouth daily.    [provider]  isosorbide-hydrALAZINE (BIDIL) 20-37.5 MG tablet Take 1 tablet by mouth 3 (three) times daily. 05/26/21   Patwardhan, Reynold Bowen, MD  lenalidomide (REVLIMID) 15 MG capsule Take by mouth. 05/11/21 06/01/21  [provider]  magnesium oxide (MAG-OX) 400 MG tablet Take by mouth.    [provider]  Multiple Vitamin (MULTIVITAMIN) tablet Take 1 tablet by mouth daily.    [provider]  nitroGLYCERIN (NITROSTAT) 0.4 MG  SL tablet Place 1 tablet (0.4 mg total) under the tongue every 5 (five) minutes as needed for chest pain. 03/31/21 03/31/22  Glendale Chard, MD  prochlorperazine (COMPAZINE) 5 MG tablet Take by mouth. 03/17/21   [provider]  terazosin (HYTRIN) 5 MG capsule TAKE 1 CAPSULE BY MOUTH  DAILY AT BEDTIME 07/30/20   Glendale Chard, MD  tiZANidine (ZANAFLEX) 2 MG tablet Take 1 tablet (2 mg total) by mouth every 8 (eight) hours as needed for muscle spasms. 12/17/20   Bary Castilla, NP   traMADol (ULTRAM) 50 MG tablet Take by mouth. 04/13/21   [provider]    Allergies Patient has no known allergies.  Family History  Problem Relation Age of Onset   Healthy Mother    Prostate cancer Father    Testicular cancer Brother    Prostate cancer Brother    Lung cancer Brother    Cancer Brother        double mynoma   Hypertension Other     Social History Social History   Tobacco Use   Smoking status: Former    Packs/day: 0.50    Years: 35.00    Pack years: 17.50    Types: Cigarettes    Quit date: 12/12/1997    Years since quitting: 23.4   Smokeless tobacco: Never   Tobacco comments:    12 cigs daily  Vaping Use   Vaping Use: Never used  Substance Use Topics   Alcohol use: Yes    Comment: ocasional beer   Drug use: No    Review of Systems Constitutional: No fever. Eyes: No visual changes. ENT: No sore throat. Cardiovascular: Denies chest pain. Respiratory: Denies shortness of breath. Gastrointestinal: No nausea, vomiting, diarrhea. Genitourinary: Negative for dysuria. Musculoskeletal: Negative for back pain. Skin: Negative for rash. Neurological: Negative for focal weakness or numbness.  ____________________________________________   PHYSICAL EXAM:  VITAL SIGNS: ED Triage Vitals  Enc Vitals Group     BP 05/29/21 2356 (!) 72/43     Pulse Rate 05/29/21 2356 64     Resp 05/29/21 2356 16     Temp 05/30/21 0007 99 F (37.2 C)     Temp Source 05/30/21 0007 Oral     SpO2 05/29/21 2356 98 %     Weight 05/30/21 0003 235 lb 14.3 oz (107 kg)     Height 05/30/21 0003 '6\' 3"'  (1.905 m)     Head Circumference --      Peak Flow --      Pain Score --      Pain Loc --      Pain Edu? --      Excl. in [place]? --    CONSTITUTIONAL: Alert and oriented and responds appropriately to questions. Well-appearing; well-nourished HEAD: Normocephalic EYES: Conjunctivae clear, pupils appear equal, EOM appear intact ENT: normal nose; moist mucous  membranes NECK: Supple, normal ROM CARD: RRR; S1 and S2 appreciated; no murmurs, no clicks, no rubs, no gallops RESP: Normal chest excursion without splinting or tachypnea; breath sounds clear and equal bilaterally; no wheezes, no rhonchi, no rales, no hypoxia or respiratory distress, speaking full sentences ABD/GI: Normal bowel sounds; non-distended; soft, non-tender, no rebound, no guarding, no peritoneal signs, no hepatosplenomegaly RECTAL:  Normal rectal tone, no gross blood or melena, guaiac NEGATIVE, no hemorrhoids appreciated, nontender rectal exam, no fecal impaction. Chaperone present. BACK: The back appears normal EXT: Normal ROM in all joints; no deformity noted, no edema; no cyanosis, no calf tenderness or calf  swelling SKIN: Normal color for age and race; warm; no rash on exposed skin NEURO: Moves all extremities equally, normal speech PSYCH: The patient's mood and manner are appropriate.  ____________________________________________   LABS (all labs ordered are listed, but only abnormal results are displayed)  Labs Reviewed  BASIC METABOLIC PANEL - Abnormal; Notable for the following components:      Result Value   Potassium 3.4 (*)    Glucose, Bld 133 (*)    BUN 27 (*)    Creatinine, Ser 1.57 (*)    Calcium 7.9 (*)    GFR, Estimated 45 (*)    Anion gap 4 (*)    All other components within normal limits  CBC - Abnormal; Notable for the following components:   WBC 3.6 (*)    RBC 3.51 (*)    Hemoglobin 8.6 (*)    HCT 28.4 (*)    MCH 24.5 (*)    RDW 16.8 (*)    All other components within normal limits  MAGNESIUM - Abnormal; Notable for the following components:   Magnesium 2.5 (*)    All other components within normal limits  CBG MONITORING, ED - Abnormal; Notable for the following components:   Glucose-Capillary 115 (*)    All other components within normal limits  RESP PANEL BY RT-PCR (FLU A&B, COVID) ARPGX2  TSH  URINALYSIS, COMPLETE (UACMP) WITH MICROSCOPIC   TROPONIN I (HIGH SENSITIVITY)   ____________________________________________  EKG   Date: 05/30/2021 00:11  Rate: 64  Rhythm: normal sinus rhythm  QRS Axis: normal  Intervals: normal  ST/T Wave abnormalities: normal  Conduction Disutrbances: none  Narrative Interpretation: unremarkable    ____________________________________________  RADIOLOGY I, Durga Saldarriaga, personally viewed and evaluated these images (plain radiographs) as part of my medical decision making, as well as reviewing the written report by the radiologist.  ED MD interpretation:  Chest x-ray clear.  No cardiomegaly, widened mediastinum.  Official radiology report(s): DG Chest 2 View  Result Date: 05/29/2021 CLINICAL DATA:  Palpitations. EXAM: CHEST - 2 VIEW COMPARISON:  12/16/2019 FINDINGS: Heart size and vascularity normal. Mild scarring in the lung bases unchanged. No acute infiltrate or effusion. IMPRESSION: No active cardiopulmonary disease. Electronically Signed   By: Franchot Gallo M.D.   On: 05/29/2021 11:22   DG Chest Portable 1 View  Result Date: 05/30/2021 CLINICAL DATA:  Atrial fibrillation. Palpitations. Syncope. Chest pain. EXAM: PORTABLE CHEST 1 VIEW COMPARISON:  05/29/2021 FINDINGS: The heart size and mediastinal contours are within normal limits. Both lungs are clear. The visualized skeletal structures are unremarkable. IMPRESSION: No active disease. Electronically Signed   By: Lucienne Capers M.D.   On: 05/30/2021 00:45    ____________________________________________   PROCEDURES  Procedure(s) performed (including Critical Care):  Procedures  CRITICAL CARE Performed by: Cyril Mourning Morayma [place]   Total critical care time: 45 minutes  Critical care time was exclusive of separately billable procedures and treating other patients.  Critical care was necessary to treat or prevent imminent or life-threatening deterioration.  Critical care was time spent personally by me on the following  activities: development of treatment plan with patient and/or surrogate as well as nursing, discussions with consultants, evaluation of patient's response to treatment, examination of patient, obtaining history from patient or surrogate, ordering and performing treatments and interventions, ordering and review of laboratory studies, ordering and review of radiographic studies, pulse oximetry and re-evaluation of patient's condition.  ____________________________________________   INITIAL IMPRESSION / ASSESSMENT AND PLAN / ED COURSE  As part of  my medical decision making, I reviewed the following data within the [facility] notes reviewed and incorporated, Labs reviewed , EKG interpreted , Old EKG reviewed, Old chart reviewed, Radiograph reviewed , Discussed with admitting physician , and Notes from prior ED visits         Patient here with recurrent palpitations that have resolved.  Has known history of paroxysmal atrial fibrillation.  While in the waiting room had a syncopal event without seizure-like activity and was hypotensive.  Patient does not not recall the this and denies any preceding symptoms.  Recheck blood pressure was improved.  Will obtain CBG.  No injury as he was sitting in a wheelchair when this happened he did not fall to the ground.  Oral temperature of 99.  Will check rectal temperature to ensure no sepsis.  Will repeat labs including hemoglobin, electrolytes, cardiac enzymes and obtain repeat chest x-ray, urinalysis.  Will give IV fluids.  EKG is nonischemic, normal sinus rhythm.  He is on the cardiac monitor.  ED PROGRESS  Patient continues to be in a sinus rhythm and hemodynamically stable.  No acute complaints at this time.  Electrolytes within normal limits.  Normal TSH.  Troponin negative.  Rectal temperature normal.  COVID, flu negative.  Glucose normal.  Patient's hemoglobin has been dropping since March 2022.  He is on Eliquis.  Denies any bloody  stools, melena.  He is Hemoccult negative here.  Will discuss with hospitalist for admission for palpitations, syncope.   1:43 AM Discussed patient's case with hospitalist, Dr. Sidney Ace.  I have recommended admission and patient (and family if present) agree with this plan. Admitting physician will place admission orders.   I reviewed all nursing notes, vitals, pertinent previous records and reviewed/interpreted all EKGs, lab and urine results, imaging (as available).  ____________________________________________   FINAL CLINICAL IMPRESSION(S) / ED DIAGNOSES  Final diagnoses:  Palpitations  Syncope, unspecified syncope type  Anemia, unspecified type     ED Discharge Orders     None       *Please note:  JAMERSON VONBARGEN was evaluated in Emergency Department on 05/30/2021 for the symptoms described in the history of present illness. He was evaluated in the context of the global COVID-19 pandemic, which necessitated consideration that the patient might be at risk for infection with the SARS-CoV-2 virus that causes COVID-19. Institutional protocols and algorithms that pertain to the evaluation of patients at risk for COVID-19 are in a state of rapid change based on information released by regulatory bodies including the CDC and federal and state organizations. These policies and algorithms were followed during the patient's care in the ED.  Some ED evaluations and interventions may be delayed as a result of limited staffing during and the pandemic.*   Note:  This document was prepared using Dragon voice recognition software and may include unintentional dictation errors.    Brahim Dolman, Delice Bison, DO 05/30/21 [phone]

## 2021-05-30 NOTE — ED Notes (Signed)
US at bedside

## 2021-05-30 NOTE — ED Notes (Signed)
Report called to Ashley, RN.

## 2021-05-30 NOTE — ED Notes (Signed)
While triaging pt and asking pt questions and obtaining blood pressure, pt stopped responding to RN. Pt with syncopal episode in triage, lasted approx 10 seconds.

## 2021-05-30 NOTE — H&P (Signed)
[name]   PATIENT NAME: Edward Gallagher    MR#:  [id number]  DATE OF BIRTH:  Oct 03, 1941  DATE OF ADMISSION:  05/30/2021  PRIMARY CARE PHYSICIAN: Glendale Chard, MD   Patient is coming from: Home  REQUESTING/REFERRING PHYSICIAN: Ward, Delice Bison, DO  CHIEF COMPLAINT:  Palpitations  HISTORY OF PRESENT ILLNESS:  Edward Gallagher is a 80 y.o. African-American male with medical history significant for atrial fibrillation on Eliquis, hypertension, dyslipidemia, multiple myeloma, BPH and obstructive sleep apnea, who presented to the ER with acute onset of palpitations with associated chest pain and had a syncopal episode for about 10 seconds while sitting his wheelchair and was diaphoretic in the waiting room in the ER triage with associated hypotension and systolic blood pressure of 70.  His palpitations lasted about 35 minutes tonight.  No witnessed seizures during his syncopal episode.  No fever chills nausea or vomiting or abdominal pain.  No cough or wheezing or hemoptysis.  No dysuria, oliguria or hematuria or flank pain.  ED Course: When he came to the ER blood pressure was 132/54 with respiratory to 22 and during his syncope his blood pressure was 72/43 and was later up to 129/62.  CBC showed anemia slightly worse than previous level and stool Hemoccult came back negative.  BMP showed a BUN of 27 and creatinine 1.57 compared with 23/1.27.  Potassium 3.4.  TSH was 2.77.   EKG as reviewed by me : Showed normal sinus rhythm with rate of 73 w repeat EKG showed no significant changes, with minimal voltage criteria for LVH.  Repeat EKG showed no acute changes. Imaging: 2 view chest ray showed no acute cardiopulmonary disease.  He later had a portable chest x-ray that was negative.  The patient was given 1 L bolus of IV normal saline.  He will be admitted to an observation progressive unit bed for further evaluation and management.  PAST MEDICAL HISTORY:   Past Medical History:   Diagnosis Date   A-fib ([place])    Allergy    Hyperlipidemia    Hypertension    Multiple myeloma ([facility])    Prostatic hypertrophy    Sinus trouble    Sleep apnea     PAST SURGICAL HISTORY:   Past Surgical History:  Procedure Laterality Date   HERNIA REPAIR     VASECTOMY      SOCIAL HISTORY:   Social History   Tobacco Use   Smoking status: Former    Packs/day: 0.50    Years: 35.00    Pack years: 17.50    Types: Cigarettes    Quit date: 12/12/1997    Years since quitting: 23.4   Smokeless tobacco: Never   Tobacco comments:    12 cigs daily  Substance Use Topics   Alcohol use: Yes    Comment: ocasional beer    FAMILY HISTORY:   Family History  Problem Relation Age of Onset   Healthy Mother    Prostate cancer Father    Testicular cancer Brother    Prostate cancer Brother    Lung cancer Brother    Cancer Brother        double mynoma   Hypertension Other     DRUG ALLERGIES:  No Known Allergies  REVIEW OF SYSTEMS:   ROS As per history of present illness. All pertinent systems were reviewed above. Constitutional, HEENT, cardiovascular, respiratory, GI, GU, musculoskeletal, neuro, psychiatric, endocrine, integumentary and hematologic systems were reviewed and are otherwise negative/unremarkable except  for positive findings mentioned above in the HPI.   MEDICATIONS AT HOME:   Prior to Admission medications   Medication Sig Start Date End Date Taking? Authorizing Provider  Acetaminophen 500 MG capsule Take by mouth.    [provider]  atorvastatin (LIPITOR) 40 MG tablet Take 1 tablet (40 mg total) by mouth daily. 03/31/21   Glendale Chard, MD  calcium carbonate (OS-CAL - DOSED IN MG OF ELEMENTAL CALCIUM) 1250 (500 Ca) MG tablet Take by mouth.    [provider]  Cholecalciferol 25 MCG (1000 UT) tablet Take by mouth.    [provider]  dexamethasone (DECADRON) 4 MG tablet Take by mouth. 03/31/21   [provider]  ELIQUIS 5 MG  TABS tablet TAKE 1 TABLET(5 MG) BY MOUTH TWICE DAILY 03/19/21   Patwardhan, Reynold Bowen, MD  ferrous sulfate 325 (65 FE) MG EC tablet Take 1 tablet by mouth daily with breakfast. 05/18/21 05/18/22  [provider]  finasteride (PROSCAR) 5 MG tablet Take 5 mg by mouth daily.    [provider]  isosorbide-hydrALAZINE (BIDIL) 20-37.5 MG tablet Take 1 tablet by mouth 3 (three) times daily. 05/26/21   Patwardhan, Reynold Bowen, MD  lenalidomide (REVLIMID) 15 MG capsule Take by mouth. 05/11/21 06/01/21  [provider]  magnesium oxide (MAG-OX) 400 MG tablet Take by mouth.    [provider]  Multiple Vitamin (MULTIVITAMIN) tablet Take 1 tablet by mouth daily.    [provider]  nitroGLYCERIN (NITROSTAT) 0.4 MG SL tablet Place 1 tablet (0.4 mg total) under the tongue every 5 (five) minutes as needed for chest pain. 03/31/21 03/31/22  Glendale Chard, MD  prochlorperazine (COMPAZINE) 5 MG tablet Take by mouth. 03/17/21   [provider]  terazosin (HYTRIN) 5 MG capsule TAKE 1 CAPSULE BY MOUTH  DAILY AT BEDTIME 07/30/20   Glendale Chard, MD  tiZANidine (ZANAFLEX) 2 MG tablet Take 1 tablet (2 mg total) by mouth every 8 (eight) hours as needed for muscle spasms. 12/17/20   Bary Castilla, NP  traMADol (ULTRAM) 50 MG tablet Take by mouth. 04/13/21   [provider]      VITAL SIGNS:  Blood pressure 132/64, pulse 62, temperature 99.5 F (37.5 C), temperature source Rectal, resp. rate 13, height _0  (1.905 m), weight 107 kg, SpO2 98 %.  PHYSICAL EXAMINATION:  Physical Exam  GENERAL:  80 y.o.-year-old African-American male patient lying in the bed with no acute distress.  EYES: Pupils equal, round, reactive to light and accommodation. No scleral icterus. Extraocular muscles intact.  HEENT: Head atraumatic, normocephalic. Oropharynx and nasopharynx clear.  NECK:  Supple, no jugular venous distention. No thyroid enlargement, no tenderness.  LUNGS: Normal breath  sounds bilaterally, no wheezing, rales,rhonchi or crepitation. No use of accessory muscles of respiration.  CARDIOVASCULAR: Regular rate and rhythm, S1, S2 normal. No murmurs, rubs, or gallops.  ABDOMEN: Soft, nondistended, nontender. Bowel sounds present. No organomegaly or mass.  EXTREMITIES: No pedal edema, cyanosis, or clubbing.  NEUROLOGIC: Cranial nerves II through XII are intact. Muscle strength 5/5 in all extremities. Sensation intact. Gait not checked.  PSYCHIATRIC: The patient is alert and oriented x 3.  Normal affect and good eye contact. SKIN: No obvious rash, lesion, or ulcer.   LABORATORY PANEL:   CBC Recent Labs  Lab 05/30/21 0008  WBC 3.6*  HGB 8.6*  HCT 28.4*  PLT 150   ------------------------------------------------------------------------------------------------------------------  Chemistries  Recent Labs  Lab 05/30/21 0008  NA 137  K 3.4*  CL 106  CO2 27  GLUCOSE 133*  BUN 27*  CREATININE 1.57*  CALCIUM 7.9*  MG 2.5*   ------------------------------------------------------------------------------------------------------------------  Cardiac Enzymes No results for input(s): TROPONINI in the last 168 hours. ------------------------------------------------------------------------------------------------------------------  RADIOLOGY:  DG Chest 2 View  Result Date: 05/29/2021 CLINICAL DATA:  Palpitations. EXAM: CHEST - 2 VIEW COMPARISON:  12/16/2019 FINDINGS: Heart size and vascularity normal. Mild scarring in the lung bases unchanged. No acute infiltrate or effusion. IMPRESSION: No active cardiopulmonary disease. Electronically Signed   By: Franchot Gallo M.D.   On: 05/29/2021 11:22   DG Chest Portable 1 View  Result Date: 05/30/2021 CLINICAL DATA:  Atrial fibrillation. Palpitations. Syncope. Chest pain. EXAM: PORTABLE CHEST 1 VIEW COMPARISON:  05/29/2021 FINDINGS: The heart size and mediastinal contours are within normal limits. Both lungs are clear.  The visualized skeletal structures are unremarkable. IMPRESSION: No active disease. Electronically Signed   By: Lucienne Capers M.D.   On: 05/30/2021 00:45      IMPRESSION AND PLAN:  Active Problems:   Syncope  1.  Palpitations. - The patient will be admitted to an observation progressive unit bed. - He will be monitored for arrhythmias in the possibility of paroxysmal atrial fibrillation. - We will continue his Eliquis. - Cardiology consult will be obtained.  The patient follows with Dr. Virgina Jock with [facility] cardiovascular - I notified Dr. Clayborn Bigness about the patient.  2.  Syncope. - Will check orthostatics q 12 hours. - Will obtain a bilateral carotid Doppler and 2D echo. - The patient will be gently hydrated with IV normal saline and monitored for arrhythmias. Differential diagnosis would include  orthostatic hypotension, neurally mediated syncope, cardiogenic, arrhythmias related, and less likely hypoglycemia.  3.  Dyslipidemia. - We will continue statin therapy.  4.  Multiple myeloma. - The patient is on Revlimid for it.  5.  GERD. - We will continue PPI therapy.  6.  Essential hypertension. - Continue Hytrin and vital   DVT prophylaxis: We will continue Eliquis. Code Status: full code. Family Communication:  The plan of care was discussed in details with the patient (and family). I answered all questions. The patient agreed to proceed with the above mentioned plan. Further management will depend upon hospital course. Disposition Plan: Back to previous home environment Consults called: Cardiology consult.   All the records are reviewed and case discussed with ED provider.  Status is: Observation  The patient remains OBS appropriate and will d/c before 2 midnights.  Dispo: The patient is from: Home              Anticipated d/c is to: Home              Patient currently is not medically stable to d/c.   Difficult to place patient No   TOTAL TIME TAKING CARE  OF THIS PATIENT: 55 minutes.    Christel Mormon M.D on 05/30/2021 at 1:50 AM  [facility]   From 7 PM-7 AM, contact night-coverage [email or web address]  CC: Primary care physician; Glendale Chard, MD

## 2021-05-30 NOTE — Consult Note (Signed)
CARDIOLOGY CONSULT NOTE               Patient ID: Edward Gallagher MRN: [id number] DOB/AGE: 1941-09-10 80 y.o.  Admit date: 05/30/2021 Referring Physician Dr. Garner Gavel hospitalist Primary Physician Dr. Glendale Chard primary Primary Cardiologist cardiologist is in [place] Reason for Consultation syncope history of A. fib  HPI: Patient is a 80 year old male history of A. fib on Eliquis has history of hypertension hyperlipidemia multiple myeloma obstructive sleep apnea followed in [place] patient reportedly had a witnessed syncopal episode while seated he was out for few seconds states he did not have any warning has not had this happen before denies any palpitations tachycardia no shortness of breath no lightheaded dizziness nausea vomiting or sweating patient states she does not remember the episode and felt fine once he was aware.  People with this it said he just sitting and went out .  Review of systems complete and found to be negative unless listed above     Past Medical History:  Diagnosis Date   A-fib ([place])    Allergy    Hyperlipidemia    Hypertension    Multiple myeloma ([place])    Prostatic hypertrophy    Sinus trouble    Sleep apnea     Past Surgical History:  Procedure Laterality Date   HERNIA REPAIR     VASECTOMY      (Not in a hospital admission)  Social History   Socioeconomic History   Marital status: Married    Spouse name: Not on file   Number of children: 0   Years of education: Not on file   Highest education level: Not on file  Occupational History   Occupation: retired  Tobacco Use   Smoking status: Former    Packs/day: 0.50    Years: 35.00    Pack years: 17.50    Types: Cigarettes    Quit date: 12/12/1997    Years since quitting: 23.4   Smokeless tobacco: Never   Tobacco comments:    12 cigs daily  Vaping Use   Vaping Use: Never used  Substance and Sexual Activity   Alcohol use: Yes    Comment: ocasional beer   Drug use: No    Sexual activity: Not Currently  Other Topics Concern   Not on file  Social History Narrative   Not on file   Social Determinants of Health   Financial Resource Strain: Low Risk    Difficulty of Paying Living Expenses: Not hard at all  Food Insecurity: No Food Insecurity   Worried About Charity fundraiser in the Last Year: Never true   [place] in the Last Year: Never true  Transportation Needs: No Transportation Needs   Lack of Transportation (Medical): No   Lack of Transportation (Non-Medical): No  Physical Activity: Inactive   Days of Exercise per Week: 0 days   Minutes of Exercise per Session: 0 min  Stress: No Stress Concern Present   Feeling of Stress : Not at all  Social Connections: Not on file  Intimate Partner Violence: Not on file    Family History  Problem Relation Age of Onset   Healthy Mother    Prostate cancer Father    Testicular cancer Brother    Prostate cancer Brother    Lung cancer Brother    Cancer Brother        double mynoma   Hypertension Other       Review of systems complete  and found to be negative unless listed above      PHYSICAL EXAM  General: Well developed, well nourished, in no acute distress HEENT:  Normocephalic and atramatic Neck:  No JVD.  Lungs: Clear bilaterally to auscultation and percussion. Heart: HRRR . Normal S1 and S2 without gallops or murmurs.  Abdomen: Bowel sounds are positive, abdomen soft and non-tender  Msk:  Back normal, normal gait. Normal strength and tone for age. Extremities: No clubbing, cyanosis or edema.   Neuro: Alert and oriented X 3. Psych:  Good affect, responds appropriately  Labs:   Lab Results  Component Value Date   WBC 3.2 (L) 05/30/2021   HGB 7.5 (L) 05/30/2021   HCT 24.5 (L) 05/30/2021   MCV 80.3 05/30/2021   PLT 118 (L) 05/30/2021    Recent Labs  Lab 05/30/21 0511  NA 138  K 3.5  CL 108  CO2 26  BUN 24*  CREATININE 1.24  CALCIUM 7.3*  GLUCOSE 114*   Lab Results   Component Value Date   TROPONINI < 0.02 11/25/2013    Lab Results  Component Value Date   CHOL 154 10/06/2020   CHOL 163 03/25/2020   CHOL 157 09/25/2019   Lab Results  Component Value Date   HDL 53 10/06/2020   HDL 60 03/25/2020   HDL 52 09/25/2019   Lab Results  Component Value Date   LDLCALC 85 10/06/2020   LDLCALC 92 03/25/2020   LDLCALC 91 09/25/2019   Lab Results  Component Value Date   TRIG 86 10/06/2020   TRIG 52 03/25/2020   TRIG 75 09/25/2019   Lab Results  Component Value Date   CHOLHDL 2.9 10/06/2020   CHOLHDL 2.7 03/25/2020   CHOLHDL 3.0 09/25/2019   No results found for: LDLDIRECT    Radiology: DG Chest 2 View  Result Date: 05/29/2021 CLINICAL DATA:  Palpitations. EXAM: CHEST - 2 VIEW COMPARISON:  12/16/2019 FINDINGS: Heart size and vascularity normal. Mild scarring in the lung bases unchanged. No acute infiltrate or effusion. IMPRESSION: No active cardiopulmonary disease. Electronically Signed   By: Franchot Gallo M.D.   On: 05/29/2021 11:22   US Carotid Bilateral  Result Date: 05/30/2021 CLINICAL DATA:  Syncope, hypertension, hyperlipidemia EXAM: BILATERAL CAROTID DUPLEX ULTRASOUND TECHNIQUE: Pearline Cables scale imaging, color Doppler and duplex ultrasound were performed of bilateral carotid and vertebral arteries in the neck. COMPARISON:  None. FINDINGS: Criteria: Quantification of carotid stenosis is based on velocity parameters that correlate the residual internal carotid diameter with NASCET-based stenosis levels, using the diameter of the distal internal carotid lumen as the denominator for stenosis measurement. The following velocity measurements were obtained: RIGHT ICA: 116/39 cm/sec CCA: 38/45 cm/sec SYSTOLIC ICA/CCA RATIO:  1.5 ECA: 105 cm/sec LEFT ICA: 112/40 cm/sec CCA: 36/46 cm/sec SYSTOLIC ICA/CCA RATIO:  1.3 ECA: 118 cm/sec RIGHT CAROTID ARTERY: Mild plaque in the bulb and ECA origin with minimal stenosis. Normal waveforms and color Doppler signal  throughout. RIGHT VERTEBRAL ARTERY:  Normal flow direction and waveform. LEFT CAROTID ARTERY: Mild eccentric plaque in the bulb and ICA origin with minimal stenosis. Normal waveforms and color Doppler signal throughout. LEFT VERTEBRAL ARTERY:  Normal flow direction and waveform. IMPRESSION: 1. 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 2. Antegrade bilateral vertebral arterial flow. Electronically Signed   By: Lucrezia Europe M.D.   On: 05/30/2021 08:01   DG Chest Portable 1 View  Result Date: 05/30/2021 CLINICAL DATA:  Atrial fibrillation. Palpitations. Syncope. Chest pain. EXAM:  PORTABLE CHEST 1 VIEW COMPARISON:  05/29/2021 FINDINGS: The heart size and mediastinal contours are within normal limits. Both lungs are clear. The visualized skeletal structures are unremarkable. IMPRESSION: No active disease. Electronically Signed   By: Lucienne Capers M.D.   On: 05/30/2021 00:45   ECHOCARDIOGRAM COMPLETE  Result Date: 05/30/2021    ECHOCARDIOGRAM REPORT   Patient Name:   Edward Gallagher [facility] Date of Exam: 05/30/2021 Medical Rec #:  [id number]      Height:       75.0 in Accession #:    [id number]     Weight:       235.9 lb Date of Birth:  Aug 11, 1941      BSA:          2.354 m Patient Age:    32 years       BP:           143/71 mmHg Patient Gender: M              HR:           78 bpm. Exam Location:  [facility] Procedure: 2D Echo and Intracardiac Opacification Agent Indications:     Syncope  History:         Patient has prior history of Echocardiogram examinations.                  Arrythmias:PAFIB; Risk Factors:Hypertension and Dyslipidemia.  Sonographer:     L. Thornton-Maynard Referring Phys:  [id number] Arvella Merles [place] Diagnosing Phys: Yolonda Kida MD  Sonographer Comments: Suboptimal apical window. IMPRESSIONS  1. Left ventricular ejection fraction, by estimation, is 55 to 60%. The left ventricle has normal function. The left ventricle has no regional wall motion abnormalities. Left  ventricular diastolic parameters were normal.  2. Right ventricular systolic function is normal. The right ventricular size is normal.  3. The mitral valve is normal in structure. No evidence of mitral valve regurgitation.  4. The aortic valve is normal in structure. Aortic valve regurgitation is not visualized. FINDINGS  Left Ventricle: Left ventricular ejection fraction, by estimation, is 55 to 60%. The left ventricle has normal function. The left ventricle has no regional wall motion abnormalities. Definity contrast agent was given IV to delineate the left ventricular  endocardial borders. The left ventricular internal cavity size was normal in size. There is no left ventricular hypertrophy. Left ventricular diastolic parameters were normal. Right Ventricle: The right ventricular size is normal. No increase in right ventricular wall thickness. Right ventricular systolic function is normal. Left Atrium: Left atrial size was normal in size. Right Atrium: Right atrial size was normal in size. Pericardium: There is no evidence of pericardial effusion. Mitral Valve: The mitral valve is normal in structure. No evidence of mitral valve regurgitation. Tricuspid Valve: The tricuspid valve is normal in structure. Tricuspid valve regurgitation is trivial. Aortic Valve: The aortic valve is normal in structure. Aortic valve regurgitation is not visualized. Aortic regurgitation PHT measures 494 msec. Aortic valve mean gradient measures 5.0 mmHg. Aortic valve peak gradient measures 11.3 mmHg. Aortic valve area, by VTI measures 3.33 cm. Pulmonic Valve: The pulmonic valve was normal in structure. Pulmonic valve regurgitation is not visualized. Aorta: The ascending aorta was not well visualized. IAS/Shunts: No atrial level shunt detected by color flow Doppler.  LEFT VENTRICLE PLAX 2D LVIDd:         4.90 cm  Diastology LVIDs:         3.40 cm  LV e' medial:  5.66 cm/s LV PW:         1.10 cm  LV E/e' medial:  14.0 LV IVS:         1.00 cm  LV e' lateral:   6.96 cm/s LVOT diam:     2.50 cm  LV E/e' lateral: 11.4 LV SV:         123 LV SV Index:   52 LVOT Area:     4.91 cm  RIGHT VENTRICLE RV S prime:     12.10 cm/s TAPSE (M-mode): 3.2 cm LEFT ATRIUM             Index LA diam:        4.30 cm 1.83 cm/m LA Vol (A2C):   80.3 ml 34.11 ml/m LA Vol (A4C):   45.2 ml 19.20 ml/m LA Biplane Vol: 65.7 ml 27.91 ml/m  AORTIC VALVE                    PULMONIC VALVE AV Area (Vmax):    3.33 cm     PV Vmax:       1.06 m/s AV Area (Vmean):   2.94 cm     PV Vmean:      74.000 cm/s AV Area (VTI):     3.33 cm     PV VTI:        0.267 m AV Vmax:           168.00 cm/s  PV Peak grad:  4.5 mmHg AV Vmean:          109.000 cm/s PV Mean grad:  2.0 mmHg AV VTI:            0.368 m AV Peak Grad:      11.3 mmHg AV Mean Grad:      5.0 mmHg LVOT Vmax:         114.00 cm/s LVOT Vmean:        65.200 cm/s LVOT VTI:          0.250 m LVOT/AV VTI ratio: 0.68 AI PHT:            494 msec  AORTA Ao Root diam: 3.80 cm MV E velocity: 79.30 cm/s  TRICUSPID VALVE MV A velocity: 81.40 cm/s  TR Peak grad:   22.3 mmHg MV E/A ratio:  0.97        TR Vmax:        236.00 cm/s                             SHUNTS                            Systemic VTI:  0.25 m                            Systemic Diam: 2.50 cm Tene Gato Prince [place] MD Electronically signed by Yolonda Kida MD Signature Date/Time: 05/30/2021/12:07:47 PM    Final     EKG: Sinus bradycardia rate of 60  ASSESSMENT AND PLAN:  Syncope History of atrial fibrillation Hypertension Palpitations Hyperlipidemia Multiple myeloma GERD . Plan Recommend conservative management Outpatient follow-up for syncope Etiology of syncope unclear Consider neurology follow-up Outpatient monitor 3 to 7-day Holter No change in therapy for mild bradycardia Continue statin therapy for hyperlipidemia PPI for GERD Recommend moderate hydration Signed: Yolonda Kida MD  05/30/2021, 2:17 PM

## 2021-05-30 NOTE — ED Notes (Signed)
Admitting at bedside 

## 2021-05-30 NOTE — ED Notes (Signed)
ED Provider at bedside. 

## 2021-05-31 ENCOUNTER — Telehealth: Payer: Self-pay

## 2021-05-31 NOTE — Discharge Summary (Addendum)
[facility] Discharge Summary   Patient: Edward Gallagher IWO:[id number]  PCP: Glendale Chard, MD  Date of admission: 05/30/2021   Date of discharge: 05/30/2021      Discharge Diagnoses:  Principal diagnosis Syncope   Active Problems:   Syncope   Palpitations   Admitted From: home Disposition:  Home   Recommendations for Outpatient Follow-up:  PCP: please follow up with PCP and Cardiology  Follow up LABS/TEST:  ZIO monitor  Follow-up Information     Glendale Chard, MD. Schedule an appointment as soon as possible for a visit in 2 week(s).   Specialty: Internal Medicine Contact information: [address] [place] [address] [phone]         Nigel Mormon, MD. Schedule an appointment as soon as possible for a visit in 2 week(s).   Specialties: Cardiology, Radiology Why: will need ZIO monitor. Contact information: [place] [address] [phone]                Discharge Instructions     Diet - low sodium heart healthy   Complete by: As directed    Discharge instructions   Complete by: As directed    It is important that you read the instructions as well as go over your medication list with RN to help you understand your care after this hospitalization.  Please follow-up with PCP in 1-2 weeks.  Please note that we are unable to authorize any refills for discharge medications, once you are discharged. Thus, it is imperative that you return to your primary care physician (or establish a relationship with a primary care physician if you do not have one) for your care needs. So that they can reassess your need for medications and monitor your lab values.  Please request your primary care physician to go over all Hospital Tests and Procedure/Radiological results at the follow up. Please get all Hospital records sent to your PCP by signing hospital release before you go home.   Do not drive, operating  heavy machinery, perform activities at heights, swimming or participation in water activities or provide baby sitting services because you were admitted for syncope; until you have been seen by Primary Care Physician and are cleared to do such activities.  Do not take more than prescribed Pain, Sleep and Anxiety Medications.  You were cared for by a hospitalist during your hospital stay. If you have any questions about your discharge medications or the care you received while you were in the hospital after you are discharged, you can call the hospital unit/floor you were admitted to and ask to speak with the hospitalist who took care of you. Ask for Hospitalist on call, if the hospitalist that took care of you is not available. Once you are discharged, your primary care physician will help you with any further medical issues. You Must read complete instructions/literature along with all the possible adverse reactions/side effects for all the Medicines you take and that have been prescribed to you. Take any new Medicines after you have completely understood and accept all the possible adverse reactions/side effects. If you have smoked or chewed Tobacco, please STOP smoking. please STOP any Recreational drug use. If you drink alcohol, please safely reduce the use. Do not drive, operating heavy machinery, perform activities at heights, swimming or participation in water activities or provide baby sitting services under influence.   Increase activity slowly   Complete by: As directed  Diet recommendation: Cardiac diet  Activity: The patient is advised to gradually reintroduce usual activities, as tolerated  Discharge Condition: stable  Code Status: Full code   History of present illness: As per the H and P dictated on admission, "Edward Gallagher is a 80 y.o. African-American male with medical history significant for atrial fibrillation on Eliquis, hypertension, dyslipidemia, multiple  myeloma, BPH and obstructive sleep apnea, who presented to the ER with acute onset of palpitations with associated chest pain and had a syncopal episode for about 10 seconds while sitting his wheelchair and was diaphoretic in the waiting room in the ER triage with associated hypotension and systolic blood pressure of 70.  His palpitations lasted about 35 minutes tonight.  No witnessed seizures during his syncopal episode.  No fever chills nausea or vomiting or abdominal pain.  No cough or wheezing or hemoptysis.  No dysuria, oliguria or hematuria or flank pain.   ED Course: When he came to the ER blood pressure was 132/54 with respiratory to 22 and during his syncope his blood pressure was 72/43 and was later up to 129/62.  CBC showed anemia slightly worse than previous level and stool Hemoccult came back negative.  BMP showed a BUN of 27 and creatinine 1.57 compared with 23/1.27.  Potassium 3.4.  TSH was 2.77.   EKG as reviewed by me : Showed normal sinus rhythm with rate of 73 w repeat EKG showed no significant changes, with minimal voltage criteria for LVH.  Repeat EKG showed no acute changes. Imaging: 2 view chest ray showed no acute cardiopulmonary disease.  He later had a portable chest x-ray that was negative.  The patient was given 1 L bolus of IV normal saline.  He will be admitted to an observation progressive unit bed for further evaluation and management."  Hospital Course:  Summary of his active problems in the hospital is as following. 1.  Palpitations. 2.  Syncope. Recurrent events.  No abnormality captured during event in ER on tele.  Was hypotensive, responded to fluids.  Echocardiogram unremarkable.  We will continue his Eliquis. Cardiology consulted, no change in therapy recommended  Normal orthostatics No significant stenosis on bilateral carotid Doppler Outpt follow up with Cardiology for ZIO monitor  3.  Dyslipidemia. We will continue statin therapy.  4.  Multiple  myeloma. Pancytopenia  The patient is on Revlimid for it. Pt to follow up with Oncology soon. Recommend to repeat CBC  5.  GERD. continue PPI therapy.  6. BPH Continue Hytrin  Patient was ambulatory without any assistance. On the day of the discharge the patient's vitals were stable, and no other new acute medical condition were reported. The patient was felt safe to be discharge at Home with no therapy needed on discharge.  Consultants: Cardiology  Procedures: Echocardiogram   DISCHARGE MEDICATION: Allergies as of 05/30/2021   No Known Allergies      Medication List     TAKE these medications    Acetaminophen 500 MG capsule Take 500-1,000 mg by mouth every 6 (six) hours as needed for pain.   acyclovir 400 MG tablet Commonly known as: ZOVIRAX Take 400 mg by mouth 2 (two) times daily.   atorvastatin 40 MG tablet Commonly known as: LIPITOR Take 1 tablet (40 mg total) by mouth daily.   calcium carbonate 1250 (500 Ca) MG tablet Commonly known as: OS-CAL - dosed in mg of elemental calcium Take by mouth.   Cholecalciferol 25 MCG (1000 UT) tablet Take 1,000 Units by  mouth daily.   dexamethasone 4 MG tablet Commonly known as: DECADRON Take 4 mg by mouth 2 (two) times a week.   Eliquis 5 MG Tabs tablet Generic drug: apixaban TAKE 1 TABLET(5 MG) BY MOUTH TWICE DAILY What changed: See the new instructions.   ferrous sulfate 325 (65 FE) MG EC tablet Take 1 tablet by mouth daily with breakfast.   finasteride 5 MG tablet Commonly known as: PROSCAR Take 5 mg by mouth daily.   lenalidomide 15 MG capsule Commonly known as: REVLIMID Take 15 mg by mouth daily.   magnesium oxide 400 MG tablet Commonly known as: MAG-OX Take 400 mg by mouth daily.   multivitamin tablet Take 1 tablet by mouth daily.   nitroGLYCERIN 0.4 MG SL tablet Commonly known as: Nitrostat Place 1 tablet (0.4 mg total) under the tongue every 5 (five) minutes as needed for chest pain.    pantoprazole 40 MG tablet Commonly known as: PROTONIX Take 40 mg by mouth daily.   prochlorperazine 5 MG tablet Commonly known as: COMPAZINE Take by mouth.   terazosin 5 MG capsule Commonly known as: HYTRIN TAKE 1 CAPSULE BY MOUTH  DAILY AT BEDTIME What changed: See the new instructions.       ASK your doctor about these medications    tiZANidine 2 MG tablet Commonly known as: ZANAFLEX Take 1 tablet (2 mg total) by mouth every 8 (eight) hours as needed for muscle spasms.   traMADol 50 MG tablet Commonly known as: ULTRAM Take by mouth.        Discharge Exam: Filed Weights   05/30/21 0003  Weight: 107 kg   Vitals:   05/30/21 1300 05/30/21 1330  BP: (!) 148/70 (!) 146/85  Pulse: (!) 55 (!) 56  Resp: 11 11  Temp:    SpO2: 100% 99%   General: Appear in mild distress, no Rash; Oral Mucosa Clear, moist. no Abnormal Neck Mass Or lumps, Conjunctiva normal  Cardiovascular: S1 and S2 Present, no Murmur, Respiratory: good respiratory effort, Bilateral Air entry present and CTA, no Crackles, no wheezes Abdomen: Bowel Sound present, Soft and no tenderness Extremities: no Pedal edema Neurology: alert and oriented to time, place, and person affect appropriate. no new focal deficit Gait not checked due to patient safety concerns   The results of significant diagnostics from this hospitalization (including imaging, microbiology, ancillary and laboratory) are listed below for reference.    Significant Diagnostic Studies: DG Chest 2 View  Result Date: 05/29/2021 CLINICAL DATA:  Palpitations. EXAM: CHEST - 2 VIEW COMPARISON:  12/16/2019 FINDINGS: Heart size and vascularity normal. Mild scarring in the lung bases unchanged. No acute infiltrate or effusion. IMPRESSION: No active cardiopulmonary disease. Electronically Signed   By: Franchot Gallo M.D.   On: 05/29/2021 11:22   US Carotid Bilateral  Result Date: 05/30/2021 CLINICAL DATA:  Syncope, hypertension, hyperlipidemia  EXAM: BILATERAL CAROTID DUPLEX ULTRASOUND TECHNIQUE: Pearline Cables scale imaging, color Doppler and duplex ultrasound were performed of bilateral carotid and vertebral arteries in the neck. COMPARISON:  None. FINDINGS: Criteria: Quantification of carotid stenosis is based on velocity parameters that correlate the residual internal carotid diameter with NASCET-based stenosis levels, using the diameter of the distal internal carotid lumen as the denominator for stenosis measurement. The following velocity measurements were obtained: RIGHT ICA: 116/39 cm/sec CCA: 95/62 cm/sec SYSTOLIC ICA/CCA RATIO:  1.5 ECA: 105 cm/sec LEFT ICA: 112/40 cm/sec CCA: 13/08 cm/sec SYSTOLIC ICA/CCA RATIO:  1.3 ECA: 118 cm/sec RIGHT CAROTID ARTERY: Mild plaque in the bulb and  ECA origin with minimal stenosis. Normal waveforms and color Doppler signal throughout. RIGHT VERTEBRAL ARTERY:  Normal flow direction and waveform. LEFT CAROTID ARTERY: Mild eccentric plaque in the bulb and ICA origin with minimal stenosis. Normal waveforms and color Doppler signal throughout. LEFT VERTEBRAL ARTERY:  Normal flow direction and waveform. IMPRESSION: 1. 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 2. Antegrade bilateral vertebral arterial flow. Electronically Signed   By: Lucrezia Europe M.D.   On: 05/30/2021 08:01   DG Chest Portable 1 View  Result Date: 05/30/2021 CLINICAL DATA:  Atrial fibrillation. Palpitations. Syncope. Chest pain. EXAM: PORTABLE CHEST 1 VIEW COMPARISON:  05/29/2021 FINDINGS: The heart size and mediastinal contours are within normal limits. Both lungs are clear. The visualized skeletal structures are unremarkable. IMPRESSION: No active disease. Electronically Signed   By: Lucienne Capers M.D.   On: 05/30/2021 00:45   ECHOCARDIOGRAM COMPLETE  Result Date: 05/30/2021    ECHOCARDIOGRAM REPORT   Patient Name:   DARRY KELNHOFER [facility] Date of Exam: 05/30/2021 Medical Rec #:  [id number]      Height:       75.0  in Accession #:    [id number]     Weight:       235.9 lb Date of Birth:  25-Nov-1941      BSA:          2.354 m Patient Age:    34 years       BP:           143/71 mmHg Patient Gender: M              HR:           78 bpm. Exam Location:  [facility] Procedure: 2D Echo and Intracardiac Opacification Agent Indications:     Syncope  History:         Patient has prior history of Echocardiogram examinations.                  Arrythmias:PAFIB; Risk Factors:Hypertension and Dyslipidemia.  Sonographer:     L. Thornton-Maynard Referring Phys:  [id number] Arvella Merles [place] Diagnosing Phys: Yolonda Kida MD  Sonographer Comments: Suboptimal apical window. IMPRESSIONS  1. Left ventricular ejection fraction, by estimation, is 55 to 60%. The left ventricle has normal function. The left ventricle has no regional wall motion abnormalities. Left ventricular diastolic parameters were normal.  2. Right ventricular systolic function is normal. The right ventricular size is normal.  3. The mitral valve is normal in structure. No evidence of mitral valve regurgitation.  4. The aortic valve is normal in structure. Aortic valve regurgitation is not visualized. FINDINGS  Left Ventricle: Left ventricular ejection fraction, by estimation, is 55 to 60%. The left ventricle has normal function. The left ventricle has no regional wall motion abnormalities. Definity contrast agent was given IV to delineate the left ventricular  endocardial borders. The left ventricular internal cavity size was normal in size. There is no left ventricular hypertrophy. Left ventricular diastolic parameters were normal. Right Ventricle: The right ventricular size is normal. No increase in right ventricular wall thickness. Right ventricular systolic function is normal. Left Atrium: Left atrial size was normal in size. Right Atrium: Right atrial size was normal in size. Pericardium: There is no evidence of pericardial effusion. Mitral Valve: The mitral valve is normal in structure.  No evidence of mitral valve regurgitation. Tricuspid Valve: The tricuspid valve is normal in structure. Tricuspid valve regurgitation is trivial. Aortic Valve:  The aortic valve is normal in structure. Aortic valve regurgitation is not visualized. Aortic regurgitation PHT measures 494 msec. Aortic valve mean gradient measures 5.0 mmHg. Aortic valve peak gradient measures 11.3 mmHg. Aortic valve area, by VTI measures 3.33 cm. Pulmonic Valve: The pulmonic valve was normal in structure. Pulmonic valve regurgitation is not visualized. Aorta: The ascending aorta was not well visualized. IAS/Shunts: No atrial level shunt detected by color flow Doppler.  LEFT VENTRICLE PLAX 2D LVIDd:         4.90 cm  Diastology LVIDs:         3.40 cm  LV e' medial:    5.66 cm/s LV PW:         1.10 cm  LV E/e' medial:  14.0 LV IVS:        1.00 cm  LV e' lateral:   6.96 cm/s LVOT diam:     2.50 cm  LV E/e' lateral: 11.4 LV SV:         123 LV SV Index:   52 LVOT Area:     4.91 cm  RIGHT VENTRICLE RV S prime:     12.10 cm/s TAPSE (M-mode): 3.2 cm LEFT ATRIUM             Index LA diam:        4.30 cm 1.83 cm/m LA Vol (A2C):   80.3 ml 34.11 ml/m LA Vol (A4C):   45.2 ml 19.20 ml/m LA Biplane Vol: 65.7 ml 27.91 ml/m  AORTIC VALVE                    PULMONIC VALVE AV Area (Vmax):    3.33 cm     PV Vmax:       1.06 m/s AV Area (Vmean):   2.94 cm     PV Vmean:      74.000 cm/s AV Area (VTI):     3.33 cm     PV VTI:        0.267 m AV Vmax:           168.00 cm/s  PV Peak grad:  4.5 mmHg AV Vmean:          109.000 cm/s PV Mean grad:  2.0 mmHg AV VTI:            0.368 m AV Peak Grad:      11.3 mmHg AV Mean Grad:      5.0 mmHg LVOT Vmax:         114.00 cm/s LVOT Vmean:        65.200 cm/s LVOT VTI:          0.250 m LVOT/AV VTI ratio: 0.68 AI PHT:            494 msec  AORTA Ao Root diam: 3.80 cm MV E velocity: 79.30 cm/s  TRICUSPID VALVE MV A velocity: 81.40 cm/s  TR Peak grad:   22.3 mmHg MV E/A ratio:  0.97        TR Vmax:        236.00 cm/s                              SHUNTS                            Systemic VTI:  0.25 m  Systemic Diam: 2.50 cm Yolonda Kida MD Electronically signed by Yolonda Kida MD Signature Date/Time: 05/30/2021/12:07:47 PM    Final     Microbiology: Recent Results (from the past 240 hour(s))  Resp Panel by RT-PCR (Flu A&B, Covid) Nasopharyngeal Swab     Status: None   Collection Time: 05/30/21 12:24 AM   Specimen: Nasopharyngeal Swab; Nasopharyngeal(NP) swabs in vial transport medium  Result Value Ref Range Status   SARS Coronavirus 2 by RT PCR NEGATIVE NEGATIVE Final    Comment: (NOTE) SARS-CoV-2 target nucleic acids are NOT DETECTED.  The SARS-CoV-2 RNA is generally detectable in upper respiratory specimens during the acute phase of infection. The lowest concentration of SARS-CoV-2 viral copies this assay can detect is 138 copies/mL. A negative result does not preclude SARS-Cov-2 infection and should not be used as the sole basis for treatment or other patient management decisions. A negative result may occur with  improper specimen collection/handling, submission of specimen other than nasopharyngeal swab, presence of viral mutation(s) within the areas targeted by this assay, and inadequate number of viral copies(<138 copies/mL). A negative result must be combined with clinical observations, patient history, and epidemiological information. The expected result is Negative.  Fact Sheet for Patients:  EntrepreneurPulse.com.au  Fact Sheet for Healthcare Providers:  IncredibleEmployment.be  This test is no t yet approved or cleared by the [place] FDA and  has been authorized for detection and/or diagnosis of SARS-CoV-2 by FDA under an Emergency Use Authorization (EUA). This EUA will remain  in effect (meaning this test can be used) for the duration of the COVID-19 declaration under Section 564(b)(1) of the Act,  21 U.S.C.section 360bbb-3(b)(1), unless the authorization is terminated  or revoked sooner.       Influenza A by PCR NEGATIVE NEGATIVE Final   Influenza B by PCR NEGATIVE NEGATIVE Final    Comment: (NOTE) The Xpert Xpress SARS-CoV-2/FLU/RSV plus assay is intended as an aid in the diagnosis of influenza from Nasopharyngeal swab specimens and should not be used as a sole basis for treatment. Nasal washings and aspirates are unacceptable for Xpert Xpress SARS-CoV-2/FLU/RSV testing.  Fact Sheet for Patients: EntrepreneurPulse.com.au  Fact Sheet for Healthcare Providers: IncredibleEmployment.be  This test is not yet approved or cleared by the [place] FDA and has been authorized for detection and/or diagnosis of SARS-CoV-2 by FDA under an Emergency Use Authorization (EUA). This EUA will remain in effect (meaning this test can be used) for the duration of the COVID-19 declaration under Section 564(b)(1) of the Act, 21 U.S.C. section 360bbb-3(b)(1), unless the authorization is terminated or revoked.  Performed at [facility], [address] [place], [place] [address]      Labs: CBC: Recent Labs  Lab 05/29/21 1031 05/30/21 0008 05/30/21 0511  WBC 2.7* 3.6* 3.2*  HGB 8.7* 8.6* 7.5*  HCT 28.4* 28.4* 24.5*  MCV 80.0 80.9 80.3  PLT 140* 150 846*   Basic Metabolic Panel: Recent Labs  Lab 05/29/21 1031 05/30/21 0008 05/30/21 0511  NA 136 137 138  K 3.5 3.4* 3.5  CL 102 106 108  CO2 27 27 26   GLUCOSE 114* 133* 114*  BUN 23 27* 24*  CREATININE 1.36* 1.57* 1.24  CALCIUM 8.1* 7.9* 7.3*  MG  --  2.5*  --    Liver Function Tests: No results for input(s): AST, ALT, ALKPHOS, BILITOT, PROT, ALBUMIN in the last 168 hours. CBG: Recent Labs  Lab 05/30/21 0016  GLUCAP 115*    Time spent: 35  minutes  Signed:  Berle Mull  [facility] 05/30/2021 2:35 PM

## 2021-05-31 NOTE — Telephone Encounter (Signed)
Transition Care Management Unsuccessful Follow-up Telephone Call  Date of discharge and from where:  05/30/2021 [place]   Attempts:  1st Attempt  Reason for unsuccessful TCM follow-up call:  Left voice message

## 2021-05-31 NOTE — Telephone Encounter (Signed)
Transition Care Management Follow-up Telephone Call Date of discharge and from where: 05/30/2021 [facility] How have you been since you were released from the hospital? Patient states he has been ok. Any questions or concerns? Yes, pt states he will discuss at his visit if it's ok.  Items Reviewed: Did the pt receive and understand the discharge instructions provided? Yes  Medications obtained and verified? Yes  Other? No  Any new allergies since your discharge? No  Dietary orders reviewed? No Do you have support at home? Yes   Home Care and Equipment/Supplies: Were home health services ordered? no If so, what is the name of the agency?   Has the agency set up a time to come to the patient's home? not applicable Were any new equipment or medical supplies ordered?  No What is the name of the medical supply agency?  Were you able to get the supplies/equipment? not applicable Do you have any questions related to the use of the equipment or supplies? No  Functional Questionnaire: (I = Independent and D = Dependent) ADLs: I  Bathing/Dressing- I  Meal Prep- I  Eating- I  Maintaining continence- I  Transferring/Ambulation- I  Managing Meds- I  Follow up appointments reviewed:  PCP Hospital f/u appt confirmed? Yes  Scheduled to see Dr Edward Gallagher on 06/21 @ 2:30. [facility] f/u appt confirmed? No  Are transportation arrangements needed? No  If their condition worsens, is the pt aware to call PCP or go to the Emergency Dept.? Yes Was the patient provided with contact information for the PCP's office or ED? Yes Was to pt encouraged to call back with questions or concerns? Yes

## 2021-06-01 ENCOUNTER — Encounter: Payer: Self-pay | Admitting: Internal Medicine

## 2021-06-01 ENCOUNTER — Ambulatory Visit: Payer: Self-pay | Admitting: Internal Medicine

## 2021-06-01 DIAGNOSIS — C9000 Multiple myeloma not having achieved remission: Secondary | ICD-10-CM | POA: Diagnosis not present

## 2021-06-02 ENCOUNTER — Other Ambulatory Visit: Payer: Self-pay | Admitting: Cardiology

## 2021-06-02 DIAGNOSIS — R55 Syncope and collapse: Secondary | ICD-10-CM

## 2021-06-08 ENCOUNTER — Ambulatory Visit (INDEPENDENT_AMBULATORY_CARE_PROVIDER_SITE_OTHER): Payer: Medicare Other | Admitting: Internal Medicine

## 2021-06-08 ENCOUNTER — Inpatient Hospital Stay: Payer: Medicare Other

## 2021-06-08 ENCOUNTER — Encounter: Payer: Self-pay | Admitting: Internal Medicine

## 2021-06-08 ENCOUNTER — Other Ambulatory Visit: Payer: Self-pay

## 2021-06-08 VITALS — BP 124/62 | HR 63 | Temp 98.3°F | Ht 75.0 in | Wt 237.4 lb

## 2021-06-08 DIAGNOSIS — I129 Hypertensive chronic kidney disease with stage 1 through stage 4 chronic kidney disease, or unspecified chronic kidney disease: Secondary | ICD-10-CM

## 2021-06-08 DIAGNOSIS — N182 Chronic kidney disease, stage 2 (mild): Secondary | ICD-10-CM

## 2021-06-08 DIAGNOSIS — R002 Palpitations: Secondary | ICD-10-CM | POA: Diagnosis not present

## 2021-06-08 DIAGNOSIS — R55 Syncope and collapse: Secondary | ICD-10-CM

## 2021-06-08 NOTE — Progress Notes (Signed)
I,Katawbba Wiggins,acting as a Education administrator for Maximino Greenland, MD.,have documented all relevant documentation on the behalf of Maximino Greenland, MD,as directed by  Maximino Greenland, MD while in the presence of Maximino Greenland, MD.  This visit occurred during the SARS-CoV-2 public health emergency.  Safety protocols were in place, including screening questions prior to the visit, additional usage of staff PPE, and extensive cleaning of exam room while observing appropriate contact time as indicated for disinfecting solutions.  Subjective:     Patient ID: Edward Gallagher , male    DOB: [birth year] , 80 y.o.   MRN: [id number]   Chief Complaint  Patient presents with   Hypertension    HPI  He presents today for a ER f/u.  He presented to [facility] on 6/19 for further evaluation of palpitations. He states that his sx started the same night. No associated chest pain, shortness of breath, diaphoresis, dizziness, nausea or vomiting.  Additionally, he presented to the emergency department the day prior w/ similar symptoms and was in a normal sinus rhythm with an unremarkable work-up.  While in triage, patient had a syncopal event which was witnessed by nursing staff.  They report that he was sitting in a wheelchair and became unresponsive for about 10 seconds and diaphoretic.  Patient states he does not recall this and denies any preceding symptoms.  There was no witnessed seizure activity.  Patient denies any recent fevers, chills, cough, diarrhea.  Cardiology was consulted, who suggested outpatient eval for syncope and suggested to consider Neurology eval.      Hypertension This is a chronic problem. The current episode started more than 1 year ago. The problem has been gradually improving since onset. The problem is controlled. Pertinent negatives include no blurred vision, chest pain, palpitations or shortness of breath. Risk factors for coronary artery disease include male gender and obesity.    Past Medical  History:  Diagnosis Date   A-fib ([place])    Allergy    Hyperlipidemia    Hypertension    Multiple myeloma (HCC)    Prostatic hypertrophy    Sinus trouble    Sleep apnea      Family History  Problem Relation Age of Onset   Healthy Mother    Prostate cancer Father    Testicular cancer Brother    Prostate cancer Brother    Lung cancer Brother    Cancer Brother        double mynoma   Hypertension Other      Current Outpatient Medications:    Acetaminophen 500 MG capsule, Take 500-1,000 mg by mouth every 6 (six) hours as needed for pain., Disp: , Rfl:    acyclovir (ZOVIRAX) 400 MG tablet, Take 400 mg by mouth 2 (two) times daily., Disp: , Rfl:    atorvastatin (LIPITOR) 40 MG tablet, Take 1 tablet (40 mg total) by mouth daily., Disp: 90 tablet, Rfl: 1   calcium carbonate (OS-CAL - DOSED IN MG OF ELEMENTAL CALCIUM) 1250 (500 Ca) MG tablet, Take by mouth., Disp: , Rfl:    Cholecalciferol 25 MCG (1000 UT) tablet, Take 1,000 Units by mouth daily., Disp: , Rfl:    dexamethasone (DECADRON) 4 MG tablet, Take 4 mg by mouth 2 (two) times a week., Disp: , Rfl:    ELIQUIS 5 MG TABS tablet, TAKE 1 TABLET(5 MG) BY MOUTH TWICE DAILY, Disp: 180 tablet, Rfl: 3   ferrous sulfate 325 (65 FE) MG EC tablet, Take 1 tablet by mouth  daily with breakfast., Disp: , Rfl:    finasteride (PROSCAR) 5 MG tablet, Take 5 mg by mouth daily., Disp: , Rfl:    magnesium oxide (MAG-OX) 400 MG tablet, Take 400 mg by mouth daily., Disp: , Rfl:    Multiple Vitamin (MULTIVITAMIN) tablet, Take 1 tablet by mouth daily., Disp: , Rfl:    nitroGLYCERIN (NITROSTAT) 0.4 MG SL tablet, Place 1 tablet (0.4 mg total) under the tongue every 5 (five) minutes as needed for chest pain., Disp: 100 tablet, Rfl: 3   pantoprazole (PROTONIX) 40 MG tablet, Take 40 mg by mouth daily., Disp: , Rfl:    prochlorperazine (COMPAZINE) 5 MG tablet, Take by mouth., Disp: , Rfl:    hydrochlorothiazide (MICROZIDE) 12.5 MG capsule, TAKE 1 CAPSULE BY MOUTH   DAILY, Disp: 90 capsule, Rfl: 3   terazosin (HYTRIN) 5 MG capsule, TAKE 1 CAPSULE BY MOUTH  DAILY AT BEDTIME, Disp: 90 capsule, Rfl: 3   tiZANidine (ZANAFLEX) 2 MG tablet, Take 1 tablet (2 mg total) by mouth every 8 (eight) hours as needed for muscle spasms., Disp: 30 tablet, Rfl: 0   traMADol (ULTRAM) 50 MG tablet, Take by mouth., Disp: , Rfl:    No Known Allergies   Review of Systems  Constitutional: Negative.   Eyes:  Negative for blurred vision.  Respiratory: Negative.  Negative for shortness of breath.   Cardiovascular: Negative.  Negative for chest pain and palpitations.  Gastrointestinal: Negative.   Psychiatric/Behavioral: Negative.    All other systems reviewed and are negative.   Today's Vitals   06/08/21 1416  BP: 124/62  Pulse: 63  Temp: 98.3 F (36.8 C)  TempSrc: Oral  Weight: 237 lb 6.4 oz (107.7 kg)  Height: _0  (1.905 m)  PainSc: 0-No pain   Body mass index is 29.67 kg/m.  Wt Readings from Last 3 Encounters:  06/17/21 241 lb (109.3 kg)  06/08/21 237 lb 6.4 oz (107.7 kg)  05/30/21 235 lb 14.3 oz (107 kg)    BP Readings from Last 3 Encounters:  06/17/21 (!) 154/63  06/08/21 124/62  05/30/21 (!) 146/85    Objective:  Physical Exam Vitals and nursing note reviewed.  Constitutional:      Appearance: Normal appearance.  HENT:     Head: Normocephalic and atraumatic.     Nose:     Comments: Masked     Mouth/Throat:     Comments: Masked  Cardiovascular:     Rate and Rhythm: Normal rate and regular rhythm.     Heart sounds: Normal heart sounds.  Pulmonary:     Effort: Pulmonary effort is normal.     Breath sounds: Normal breath sounds.  Musculoskeletal:     Cervical back: Normal range of motion.  Skin:    General: Skin is warm.  Neurological:     General: No focal deficit present.     Mental Status: He is alert.  Psychiatric:        Mood and Affect: Mood normal.        Assessment And Plan:     1. Palpitations Comments: ER records  reviewed, meds reconciled. He is scheduled to get Zio monitor later today w/ Cards. He is encouraged to stay hydrated, may benefit from Mg.  2. Syncope and collapse Comments: ER records reviewed. Will consider Neuro eval if he has recurrent symptoms.   3. Hypertensive nephropathy Comments: Well controlled. NO med changes today.   4. Chronic renal disease, stage II    Patient was given  opportunity to ask questions. Patient verbalized understanding of the plan and was able to repeat key elements of the plan. All questions were answered to their satisfaction.   I, Maximino Greenland, MD, have reviewed all documentation for this visit. The documentation on 07/08/21 for the exam, diagnosis, procedures, and orders are all accurate and complete.   IF YOU HAVE BEEN REFERRED TO A SPECIALIST, IT MAY TAKE 1-2 WEEKS TO SCHEDULE/PROCESS THE REFERRAL. IF YOU HAVE NOT HEARD FROM US/SPECIALIST IN TWO WEEKS, PLEASE GIVE [place] A CALL AT [phone].   THE PATIENT IS ENCOURAGED TO PRACTICE SOCIAL DISTANCING DUE TO THE COVID-19 PANDEMIC.

## 2021-06-09 DIAGNOSIS — R55 Syncope and collapse: Secondary | ICD-10-CM | POA: Diagnosis not present

## 2021-06-11 ENCOUNTER — Encounter: Payer: Self-pay | Admitting: Internal Medicine

## 2021-06-14 ENCOUNTER — Encounter: Payer: Self-pay | Admitting: Internal Medicine

## 2021-06-15 DIAGNOSIS — D649 Anemia, unspecified: Secondary | ICD-10-CM | POA: Diagnosis not present

## 2021-06-15 DIAGNOSIS — Z5111 Encounter for antineoplastic chemotherapy: Secondary | ICD-10-CM | POA: Diagnosis not present

## 2021-06-15 DIAGNOSIS — I4891 Unspecified atrial fibrillation: Secondary | ICD-10-CM | POA: Diagnosis not present

## 2021-06-15 DIAGNOSIS — Z79899 Other long term (current) drug therapy: Secondary | ICD-10-CM | POA: Diagnosis not present

## 2021-06-15 DIAGNOSIS — I10 Essential (primary) hypertension: Secondary | ICD-10-CM | POA: Diagnosis not present

## 2021-06-15 DIAGNOSIS — C9000 Multiple myeloma not having achieved remission: Secondary | ICD-10-CM | POA: Diagnosis not present

## 2021-06-15 DIAGNOSIS — K5903 Drug induced constipation: Secondary | ICD-10-CM | POA: Diagnosis not present

## 2021-06-17 ENCOUNTER — Encounter: Payer: Self-pay | Admitting: Cardiology

## 2021-06-17 ENCOUNTER — Ambulatory Visit: Payer: Medicare Other | Admitting: Cardiology

## 2021-06-17 ENCOUNTER — Other Ambulatory Visit: Payer: Self-pay

## 2021-06-17 VITALS — BP 154/63 | HR 58 | Temp 97.9°F | Ht 75.0 in | Wt 241.0 lb

## 2021-06-17 DIAGNOSIS — I10 Essential (primary) hypertension: Secondary | ICD-10-CM | POA: Diagnosis not present

## 2021-06-17 DIAGNOSIS — R55 Syncope and collapse: Secondary | ICD-10-CM | POA: Diagnosis not present

## 2021-06-17 DIAGNOSIS — I480 Paroxysmal atrial fibrillation: Secondary | ICD-10-CM

## 2021-06-17 NOTE — Telephone Encounter (Signed)
From pt

## 2021-06-17 NOTE — Progress Notes (Signed)
 Follow up visit  Subjective:   Edward Gallagher, male    DOB: 05/07/1941, 80 y.o.   MRN: [id number]   HPI  80 y.o. African American male with hypertension, hyperlipidemia, OSA, paroxysmal Afib  Patient was diagnosed with multiple myeloma.  He is currently on treatment at [facility].  He has not had any recurrent GI bleeding. He denies chest pain, shortness of breath, palpitations, orthopnea, PND, TIA/syncope. He has mild leg edema.  At last visit, I started him on chlorthalidone for hypertension. With this, he had acute rise in creatinine. Thus, I changes it to Bidil. After taking first tablet, he felt dizzy while getting off the commode an had a brief syncope. He was taken to [facility], fonud to be hypotensive in 70s/50s, that responded to hydration. He has been wearing a  monitor since then, report pending.  His BP at home is 130s/70s, elevated in the office.     Current Outpatient Medications on File Prior to Visit  Medication Sig Dispense Refill   Acetaminophen 500 MG capsule Take 500-1,000 mg by mouth every 6 (six) hours as needed for pain.     acyclovir (ZOVIRAX) 400 MG tablet Take 400 mg by mouth 2 (two) times daily.     atorvastatin (LIPITOR) 40 MG tablet Take 1 tablet (40 mg total) by mouth daily. 90 tablet 1   calcium carbonate (OS-CAL - DOSED IN MG OF ELEMENTAL CALCIUM) 1250 (500 Ca) MG tablet Take by mouth.     Cholecalciferol 25 MCG (1000 UT) tablet Take 1,000 Units by mouth daily.     dexamethasone (DECADRON) 4 MG tablet Take 4 mg by mouth 2 (two) times a week.     ELIQUIS 5 MG TABS tablet TAKE 1 TABLET(5 MG) BY MOUTH TWICE DAILY 180 tablet 3   ferrous sulfate 325 (65 FE) MG EC tablet Take 1 tablet by mouth daily with breakfast.     finasteride (PROSCAR) 5 MG tablet Take 5 mg by mouth daily.     lenalidomide (REVLIMID) 15 MG capsule Take by mouth.     magnesium oxide (MAG-OX) 400 MG tablet Take 400 mg by mouth daily.     Multiple Vitamin (MULTIVITAMIN) tablet Take 1 tablet by mouth  daily.     nitroGLYCERIN (NITROSTAT) 0.4 MG SL tablet Place 1 tablet (0.4 mg total) under the tongue every 5 (five) minutes as needed for chest pain. 100 tablet 3   pantoprazole (PROTONIX) 40 MG tablet Take 40 mg by mouth daily.     prochlorperazine (COMPAZINE) 5 MG tablet Take by mouth.     terazosin (HYTRIN) 5 MG capsule TAKE 1 CAPSULE BY MOUTH  DAILY AT BEDTIME 90 capsule 3   tiZANidine (ZANAFLEX) 2 MG tablet Take 1 tablet (2 mg total) by mouth every 8 (eight) hours as needed for muscle spasms. 30 tablet 0   traMADol (ULTRAM) 50 MG tablet Take by mouth.     No current facility-administered medications on file prior to visit.    Cardiovascular & other pertient studies:  Echocardiogram 05/30/2021:  1. Left ventricular ejection fraction, by estimation, is 55 to 60%. The  left ventricle has normal function. The left ventricle has no regional  wall motion abnormalities. Left ventricular diastolic parameters were  normal.   2. Right ventricular systolic function is normal. The right ventricular  size is normal.   3. The mitral valve is normal in structure. No evidence of mitral valve  regurgitation.   4. The aortic valve is normal in structure.   Aortic valve regurgitation is  not visualized.   EKG 05/21/2021: Sinus rhythm 58 bpm Normal EKG  Calcium score 01/2020: - Total Calcium Score: 38. -- Left Main: 29. -- Right Coronary: 0 -- Left Anterior Descending: 9. -- Circumflex: 0.  Lexiscan (Walking with mod Bruce)Tetrofosmin Stress Test  12/25/2019: Nondiagnostic ECG stress. Normal myocardial perfusion. Stress LV EF is mildly depressed at 42% with global hypokinesis.  No previous exam available for comparison. 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 Correlate with echocardiogram. Intermediate risk study due to low LVEF.   Abdominal Aortic Duplex  01/21/2020:  The maximum aorta (sac) diameter is 2.35 cm (prox). Diffuse calcific  plaque observed in the proximal, mid and  distal aorta. Normal flow  velocities noted.  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  Normal flow velocity in the aorta and bilateral IIA.  Consider rescan in 10 years.   Carotid artery duplex  11/15/2019: Minimal stenosis in the right internal carotid artery (minimal). Minimal stenosis in the left internal carotid artery (minimal). Mild soft plaque bilateral carotid arteries. Antegrade right vertebral artery flow. Antegrade left vertebral artery flow.  Echocardiogram 11/15/2019: Left ventricle cavity is normal in size. Moderate concentric hypertrophy of the left ventricle. Normal LV systolic function with visual EF 50-55%. Normal global wall motion. Normal diastolic filling pattern. Trileaflet aortic valve.  Mild to moderate aortic regurgitation. Moderate (Grade II) mitral regurgitation. No evidence of pulmonary hypertension.  Event monitor 11/02/2019 - 12/01/2019: Diagnostic time: 100%  Dominant rhythm: Sinus. HR 36-136 bpm. Avg HR 64 bpm. Episodes of atrial fibrillation with RVR noted. Afib burden <1% NSVT up to 4 beats noted.  Sinus bradycardia with lowest HR 36 bpm, typically occurs during sleep hours.  Occasional PAC/PVC seen. No atrial flutter/SVT/high grade AV block, sinus pause >3sec noted. Symptoms reported: None   Recent labs: 05/30/2021: Glucose 114, BUN/Cr 24/1.24. EGFR 59. Na/K 138/3.5.  H/H 7.5/24.5. MCV 80. Platelets 118 TSH 2.7 normal  05/18/2021: Glucose 88, BUN/Cr 14/1.0. EGFR 77. Na/K 141/4.8. Rest of the CMP normal H/H 8.5/28. MCV 83.   03/15/2021: Glucose 96, BUN/Cr 13/1.34. EGFR 54. Na/K 146/4.9. Rest of the CMP normal H/H 9.8/31.9. MCV 82. Platelets 247  10/06/2020: Glucose 101, BUN/Cr 17/1.28. EGFR 62. Na/K 142/4.3. Rest of the CMP normal H/H 10/32. MCV 80. Platelets 242 Chol 154, TG 86, HDL 53, LDL 85  12/16/2019: Glucose 106, BUN/Cr 16/1.2. EGFR >60. Na/K 138/3.8. Rest of the CMP normal H/H 11.5/35.9. MCV  77.9. Platelets 214 HbA1C 6.0% Troponin I 6  02/2019: Chol 157, TG 75, HDL 52, LDL 91   Review of Systems  Cardiovascular:  Negative for chest pain, dyspnea on exertion, leg swelling (Improved with compression stockings), palpitations and syncope.        Vitals:   06/17/21 0830  BP: (!) 154/63  Pulse: (!) 58  Temp: 97.9 F (36.6 C)  SpO2: 96%     Objective:   Physical Exam Vitals and nursing note reviewed.  Constitutional:      General: He is not in acute distress. Neck:     Vascular: No JVD.  Cardiovascular:     Rate and Rhythm: Normal rate and regular rhythm.     Heart sounds: Normal heart sounds. No murmur heard. Pulmonary:     Effort: Pulmonary effort is normal.     Breath sounds: Normal breath sounds. No wheezing or rales.  Musculoskeletal:     Right lower leg: No edema.  Left lower leg: No edema.        Assessment & Recommendations:    80 y.o. African American male with hypertension, hyperlipidemia, OSA, paroxysmal Afib  Syncope: Likely due to Bidil and possible dehydration. Monitor result pending.  Paroxysmal Afib: Afib burden <1%. Resting HR during sleep in 30s. Given symptoms are infrequent, would avoid scheduled AV nodal blocking agent or antiarrythmic therapy. No ischemia on stress test. Calcium score 29 (01/2020). Emphasized the use of CPAP.  CHA2DS2VASc score 3, annual stroke risk 3.2%. 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  No recent GI bleeding.  Tolerating apixaban.    Hypertension: AKI with chlorthalidone, syncope with Bidil. Thereis likely a component of white coat hypertension. Given his multiple myeloma, I am concerned that another syncopal episode may risk him to have a fracture. I am okay to let his SBP run into 130s/140s. If consistently >150 mmHg, will consider adding amlodipine or hydralazine (without isordil)  Elevated calcium score: No significant angina symptoms at this time.  Continue risk factor modification,  including statin.  Due to ongoing use of Eliquis, do not recommend aspirin.  Aorta ectasia: Mild  F/u in 6 months   Manish J Patwardhan, MD [facility] Pager: [phone] Office: [phone]    

## 2021-06-23 DIAGNOSIS — R55 Syncope and collapse: Secondary | ICD-10-CM | POA: Diagnosis not present

## 2021-06-23 DIAGNOSIS — K5900 Constipation, unspecified: Secondary | ICD-10-CM | POA: Diagnosis not present

## 2021-06-23 DIAGNOSIS — I4891 Unspecified atrial fibrillation: Secondary | ICD-10-CM | POA: Diagnosis not present

## 2021-06-23 DIAGNOSIS — Z5111 Encounter for antineoplastic chemotherapy: Secondary | ICD-10-CM | POA: Diagnosis not present

## 2021-06-23 DIAGNOSIS — C9000 Multiple myeloma not having achieved remission: Secondary | ICD-10-CM | POA: Diagnosis not present

## 2021-06-23 DIAGNOSIS — I10 Essential (primary) hypertension: Secondary | ICD-10-CM | POA: Diagnosis not present

## 2021-06-23 DIAGNOSIS — D649 Anemia, unspecified: Secondary | ICD-10-CM | POA: Diagnosis not present

## 2021-06-23 DIAGNOSIS — M25552 Pain in left hip: Secondary | ICD-10-CM | POA: Diagnosis not present

## 2021-06-23 DIAGNOSIS — Z7901 Long term (current) use of anticoagulants: Secondary | ICD-10-CM | POA: Diagnosis not present

## 2021-06-27 DIAGNOSIS — R55 Syncope and collapse: Secondary | ICD-10-CM | POA: Diagnosis not present

## 2021-06-29 DIAGNOSIS — C9000 Multiple myeloma not having achieved remission: Secondary | ICD-10-CM | POA: Diagnosis not present

## 2021-07-01 ENCOUNTER — Other Ambulatory Visit: Payer: Self-pay | Admitting: Internal Medicine

## 2021-07-10 ENCOUNTER — Encounter: Payer: Self-pay | Admitting: Internal Medicine

## 2021-07-13 DIAGNOSIS — K5900 Constipation, unspecified: Secondary | ICD-10-CM | POA: Diagnosis not present

## 2021-07-13 DIAGNOSIS — Z5111 Encounter for antineoplastic chemotherapy: Secondary | ICD-10-CM | POA: Diagnosis not present

## 2021-07-13 DIAGNOSIS — K5903 Drug induced constipation: Secondary | ICD-10-CM | POA: Diagnosis not present

## 2021-07-13 DIAGNOSIS — I10 Essential (primary) hypertension: Secondary | ICD-10-CM | POA: Diagnosis not present

## 2021-07-13 DIAGNOSIS — Z79899 Other long term (current) drug therapy: Secondary | ICD-10-CM | POA: Diagnosis not present

## 2021-07-13 DIAGNOSIS — C9000 Multiple myeloma not having achieved remission: Secondary | ICD-10-CM | POA: Diagnosis not present

## 2021-07-13 DIAGNOSIS — Z7952 Long term (current) use of systemic steroids: Secondary | ICD-10-CM | POA: Diagnosis not present

## 2021-07-13 DIAGNOSIS — Z5181 Encounter for therapeutic drug level monitoring: Secondary | ICD-10-CM | POA: Diagnosis not present

## 2021-07-13 DIAGNOSIS — Z7901 Long term (current) use of anticoagulants: Secondary | ICD-10-CM | POA: Diagnosis not present

## 2021-07-13 DIAGNOSIS — I4891 Unspecified atrial fibrillation: Secondary | ICD-10-CM | POA: Diagnosis not present

## 2021-07-13 DIAGNOSIS — D649 Anemia, unspecified: Secondary | ICD-10-CM | POA: Diagnosis not present

## 2021-07-20 DIAGNOSIS — C9000 Multiple myeloma not having achieved remission: Secondary | ICD-10-CM | POA: Diagnosis not present

## 2021-07-24 ENCOUNTER — Other Ambulatory Visit: Payer: Self-pay | Admitting: Internal Medicine

## 2021-07-26 ENCOUNTER — Encounter: Payer: Self-pay | Admitting: Internal Medicine

## 2021-07-26 DIAGNOSIS — C9000 Multiple myeloma not having achieved remission: Secondary | ICD-10-CM | POA: Diagnosis not present

## 2021-07-27 ENCOUNTER — Other Ambulatory Visit: Payer: Self-pay

## 2021-07-27 MED ORDER — FLUTICASONE PROPIONATE 50 MCG/ACT NA SUSP: NASAL | 2 refills | Status: DC

## 2021-08-02 DIAGNOSIS — Z01818 Encounter for other preprocedural examination: Secondary | ICD-10-CM | POA: Diagnosis not present

## 2021-08-02 DIAGNOSIS — C9000 Multiple myeloma not having achieved remission: Secondary | ICD-10-CM | POA: Diagnosis not present

## 2021-08-10 ENCOUNTER — Other Ambulatory Visit: Payer: Self-pay | Admitting: Internal Medicine

## 2021-08-11 DIAGNOSIS — C9000 Multiple myeloma not having achieved remission: Secondary | ICD-10-CM | POA: Diagnosis not present

## 2021-08-11 DIAGNOSIS — Z01818 Encounter for other preprocedural examination: Secondary | ICD-10-CM | POA: Diagnosis not present

## 2021-08-11 DIAGNOSIS — Z20822 Contact with and (suspected) exposure to covid-19: Secondary | ICD-10-CM | POA: Diagnosis not present

## 2021-08-13 DIAGNOSIS — H2513 Age-related nuclear cataract, bilateral: Secondary | ICD-10-CM | POA: Diagnosis not present

## 2021-08-17 DIAGNOSIS — C9000 Multiple myeloma not having achieved remission: Secondary | ICD-10-CM | POA: Diagnosis not present

## 2021-08-17 DIAGNOSIS — Z79899 Other long term (current) drug therapy: Secondary | ICD-10-CM | POA: Diagnosis not present

## 2021-08-17 DIAGNOSIS — Z01818 Encounter for other preprocedural examination: Secondary | ICD-10-CM | POA: Diagnosis not present

## 2021-08-18 DIAGNOSIS — Z01818 Encounter for other preprocedural examination: Secondary | ICD-10-CM | POA: Diagnosis not present

## 2021-08-18 DIAGNOSIS — C9000 Multiple myeloma not having achieved remission: Secondary | ICD-10-CM | POA: Diagnosis not present

## 2021-08-23 DIAGNOSIS — R200 Anesthesia of skin: Secondary | ICD-10-CM | POA: Diagnosis not present

## 2021-08-23 DIAGNOSIS — C9000 Multiple myeloma not having achieved remission: Secondary | ICD-10-CM | POA: Diagnosis not present

## 2021-08-23 DIAGNOSIS — Z5111 Encounter for antineoplastic chemotherapy: Secondary | ICD-10-CM | POA: Diagnosis not present

## 2021-08-23 DIAGNOSIS — Z79899 Other long term (current) drug therapy: Secondary | ICD-10-CM | POA: Diagnosis not present

## 2021-08-23 DIAGNOSIS — M25552 Pain in left hip: Secondary | ICD-10-CM | POA: Diagnosis not present

## 2021-08-23 DIAGNOSIS — R202 Paresthesia of skin: Secondary | ICD-10-CM | POA: Diagnosis not present

## 2021-08-23 DIAGNOSIS — Z01818 Encounter for other preprocedural examination: Secondary | ICD-10-CM | POA: Diagnosis not present

## 2021-08-23 DIAGNOSIS — Z452 Encounter for adjustment and management of vascular access device: Secondary | ICD-10-CM | POA: Diagnosis not present

## 2021-08-23 DIAGNOSIS — Z20822 Contact with and (suspected) exposure to covid-19: Secondary | ICD-10-CM | POA: Diagnosis not present

## 2021-08-25 DIAGNOSIS — Z5111 Encounter for antineoplastic chemotherapy: Secondary | ICD-10-CM | POA: Diagnosis not present

## 2021-08-25 DIAGNOSIS — C9000 Multiple myeloma not having achieved remission: Secondary | ICD-10-CM | POA: Diagnosis not present

## 2021-08-26 DIAGNOSIS — Z79899 Other long term (current) drug therapy: Secondary | ICD-10-CM | POA: Diagnosis not present

## 2021-08-26 DIAGNOSIS — R768 Other specified abnormal immunological findings in serum: Secondary | ICD-10-CM | POA: Diagnosis not present

## 2021-08-26 DIAGNOSIS — I10 Essential (primary) hypertension: Secondary | ICD-10-CM | POA: Diagnosis not present

## 2021-08-26 DIAGNOSIS — C9000 Multiple myeloma not having achieved remission: Secondary | ICD-10-CM | POA: Diagnosis not present

## 2021-08-26 DIAGNOSIS — Z5111 Encounter for antineoplastic chemotherapy: Secondary | ICD-10-CM | POA: Diagnosis not present

## 2021-08-26 DIAGNOSIS — Z7901 Long term (current) use of anticoagulants: Secondary | ICD-10-CM | POA: Diagnosis not present

## 2021-08-26 DIAGNOSIS — Z01818 Encounter for other preprocedural examination: Secondary | ICD-10-CM | POA: Diagnosis not present

## 2021-08-26 DIAGNOSIS — Z9481 Bone marrow transplant status: Secondary | ICD-10-CM | POA: Diagnosis not present

## 2021-08-26 DIAGNOSIS — K219 Gastro-esophageal reflux disease without esophagitis: Secondary | ICD-10-CM | POA: Diagnosis not present

## 2021-08-26 DIAGNOSIS — I4891 Unspecified atrial fibrillation: Secondary | ICD-10-CM | POA: Diagnosis not present

## 2021-08-27 DIAGNOSIS — Z5111 Encounter for antineoplastic chemotherapy: Secondary | ICD-10-CM | POA: Diagnosis not present

## 2021-08-27 DIAGNOSIS — Z01818 Encounter for other preprocedural examination: Secondary | ICD-10-CM | POA: Diagnosis not present

## 2021-08-27 DIAGNOSIS — C9000 Multiple myeloma not having achieved remission: Secondary | ICD-10-CM | POA: Diagnosis not present

## 2021-08-27 DIAGNOSIS — Z9481 Bone marrow transplant status: Secondary | ICD-10-CM | POA: Diagnosis not present

## 2021-08-28 DIAGNOSIS — Z9481 Bone marrow transplant status: Secondary | ICD-10-CM | POA: Diagnosis not present

## 2021-08-28 DIAGNOSIS — C9000 Multiple myeloma not having achieved remission: Secondary | ICD-10-CM | POA: Diagnosis not present

## 2021-08-29 DIAGNOSIS — C9000 Multiple myeloma not having achieved remission: Secondary | ICD-10-CM | POA: Diagnosis not present

## 2021-08-30 DIAGNOSIS — I10 Essential (primary) hypertension: Secondary | ICD-10-CM | POA: Diagnosis not present

## 2021-08-30 DIAGNOSIS — E785 Hyperlipidemia, unspecified: Secondary | ICD-10-CM | POA: Diagnosis not present

## 2021-08-30 DIAGNOSIS — R339 Retention of urine, unspecified: Secondary | ICD-10-CM | POA: Diagnosis not present

## 2021-08-30 DIAGNOSIS — D61810 Antineoplastic chemotherapy induced pancytopenia: Secondary | ICD-10-CM | POA: Diagnosis not present

## 2021-08-30 DIAGNOSIS — Z9481 Bone marrow transplant status: Secondary | ICD-10-CM | POA: Diagnosis not present

## 2021-08-30 DIAGNOSIS — Z9484 Stem cells transplant status: Secondary | ICD-10-CM | POA: Diagnosis not present

## 2021-08-30 DIAGNOSIS — M1990 Unspecified osteoarthritis, unspecified site: Secondary | ICD-10-CM | POA: Diagnosis not present

## 2021-08-30 DIAGNOSIS — R319 Hematuria, unspecified: Secondary | ICD-10-CM | POA: Diagnosis not present

## 2021-08-30 DIAGNOSIS — C9000 Multiple myeloma not having achieved remission: Secondary | ICD-10-CM | POA: Diagnosis not present

## 2021-08-30 DIAGNOSIS — I4891 Unspecified atrial fibrillation: Secondary | ICD-10-CM | POA: Diagnosis not present

## 2021-08-30 DIAGNOSIS — G4730 Sleep apnea, unspecified: Secondary | ICD-10-CM | POA: Diagnosis not present

## 2021-08-30 DIAGNOSIS — Z20822 Contact with and (suspected) exposure to covid-19: Secondary | ICD-10-CM | POA: Diagnosis not present

## 2021-08-30 DIAGNOSIS — R338 Other retention of urine: Secondary | ICD-10-CM | POA: Diagnosis not present

## 2021-08-30 DIAGNOSIS — Z87891 Personal history of nicotine dependence: Secondary | ICD-10-CM | POA: Diagnosis not present

## 2021-08-31 DIAGNOSIS — I10 Essential (primary) hypertension: Secondary | ICD-10-CM | POA: Diagnosis not present

## 2021-08-31 DIAGNOSIS — M1990 Unspecified osteoarthritis, unspecified site: Secondary | ICD-10-CM | POA: Diagnosis not present

## 2021-08-31 DIAGNOSIS — R319 Hematuria, unspecified: Secondary | ICD-10-CM | POA: Diagnosis not present

## 2021-08-31 DIAGNOSIS — D61810 Antineoplastic chemotherapy induced pancytopenia: Secondary | ICD-10-CM | POA: Diagnosis not present

## 2021-08-31 DIAGNOSIS — I4891 Unspecified atrial fibrillation: Secondary | ICD-10-CM | POA: Diagnosis not present

## 2021-08-31 DIAGNOSIS — Z87891 Personal history of nicotine dependence: Secondary | ICD-10-CM | POA: Diagnosis not present

## 2021-08-31 DIAGNOSIS — Z9484 Stem cells transplant status: Secondary | ICD-10-CM | POA: Diagnosis not present

## 2021-08-31 DIAGNOSIS — E785 Hyperlipidemia, unspecified: Secondary | ICD-10-CM | POA: Diagnosis not present

## 2021-08-31 DIAGNOSIS — G4730 Sleep apnea, unspecified: Secondary | ICD-10-CM | POA: Diagnosis not present

## 2021-08-31 DIAGNOSIS — R339 Retention of urine, unspecified: Secondary | ICD-10-CM | POA: Diagnosis not present

## 2021-08-31 DIAGNOSIS — R338 Other retention of urine: Secondary | ICD-10-CM | POA: Diagnosis not present

## 2021-08-31 DIAGNOSIS — Z9481 Bone marrow transplant status: Secondary | ICD-10-CM | POA: Diagnosis not present

## 2021-08-31 DIAGNOSIS — C9000 Multiple myeloma not having achieved remission: Secondary | ICD-10-CM | POA: Diagnosis not present

## 2021-09-01 DIAGNOSIS — R339 Retention of urine, unspecified: Secondary | ICD-10-CM | POA: Diagnosis not present

## 2021-09-01 DIAGNOSIS — M1990 Unspecified osteoarthritis, unspecified site: Secondary | ICD-10-CM | POA: Diagnosis not present

## 2021-09-01 DIAGNOSIS — D61810 Antineoplastic chemotherapy induced pancytopenia: Secondary | ICD-10-CM | POA: Diagnosis not present

## 2021-09-01 DIAGNOSIS — E785 Hyperlipidemia, unspecified: Secondary | ICD-10-CM | POA: Diagnosis not present

## 2021-09-01 DIAGNOSIS — Z9484 Stem cells transplant status: Secondary | ICD-10-CM | POA: Diagnosis not present

## 2021-09-01 DIAGNOSIS — Z9481 Bone marrow transplant status: Secondary | ICD-10-CM | POA: Diagnosis not present

## 2021-09-01 DIAGNOSIS — I10 Essential (primary) hypertension: Secondary | ICD-10-CM | POA: Diagnosis not present

## 2021-09-01 DIAGNOSIS — R319 Hematuria, unspecified: Secondary | ICD-10-CM | POA: Diagnosis not present

## 2021-09-01 DIAGNOSIS — C9000 Multiple myeloma not having achieved remission: Secondary | ICD-10-CM | POA: Diagnosis not present

## 2021-09-01 DIAGNOSIS — Z87891 Personal history of nicotine dependence: Secondary | ICD-10-CM | POA: Diagnosis not present

## 2021-09-01 DIAGNOSIS — I4891 Unspecified atrial fibrillation: Secondary | ICD-10-CM | POA: Diagnosis not present

## 2021-09-01 DIAGNOSIS — R338 Other retention of urine: Secondary | ICD-10-CM | POA: Diagnosis not present

## 2021-09-01 DIAGNOSIS — G4730 Sleep apnea, unspecified: Secondary | ICD-10-CM | POA: Diagnosis not present

## 2021-09-02 ENCOUNTER — Telehealth: Payer: Self-pay

## 2021-09-02 DIAGNOSIS — M7989 Other specified soft tissue disorders: Secondary | ICD-10-CM | POA: Diagnosis not present

## 2021-09-02 DIAGNOSIS — E876 Hypokalemia: Secondary | ICD-10-CM | POA: Diagnosis not present

## 2021-09-02 DIAGNOSIS — E860 Dehydration: Secondary | ICD-10-CM | POA: Diagnosis not present

## 2021-09-02 DIAGNOSIS — K649 Unspecified hemorrhoids: Secondary | ICD-10-CM | POA: Diagnosis not present

## 2021-09-02 DIAGNOSIS — Z9481 Bone marrow transplant status: Secondary | ICD-10-CM | POA: Diagnosis not present

## 2021-09-02 DIAGNOSIS — J9809 Other diseases of bronchus, not elsewhere classified: Secondary | ICD-10-CM | POA: Diagnosis not present

## 2021-09-02 DIAGNOSIS — I4891 Unspecified atrial fibrillation: Secondary | ICD-10-CM | POA: Diagnosis not present

## 2021-09-02 DIAGNOSIS — R197 Diarrhea, unspecified: Secondary | ICD-10-CM | POA: Diagnosis not present

## 2021-09-02 DIAGNOSIS — C9000 Multiple myeloma not having achieved remission: Secondary | ICD-10-CM | POA: Diagnosis not present

## 2021-09-02 DIAGNOSIS — I4581 Long QT syndrome: Secondary | ICD-10-CM | POA: Diagnosis not present

## 2021-09-02 DIAGNOSIS — I10 Essential (primary) hypertension: Secondary | ICD-10-CM | POA: Diagnosis not present

## 2021-09-02 DIAGNOSIS — Z452 Encounter for adjustment and management of vascular access device: Secondary | ICD-10-CM | POA: Diagnosis not present

## 2021-09-02 DIAGNOSIS — R338 Other retention of urine: Secondary | ICD-10-CM | POA: Diagnosis not present

## 2021-09-02 DIAGNOSIS — R5383 Other fatigue: Secondary | ICD-10-CM | POA: Diagnosis not present

## 2021-09-02 DIAGNOSIS — T451X5A Adverse effect of antineoplastic and immunosuppressive drugs, initial encounter: Secondary | ICD-10-CM | POA: Diagnosis not present

## 2021-09-02 DIAGNOSIS — Z01818 Encounter for other preprocedural examination: Secondary | ICD-10-CM | POA: Diagnosis not present

## 2021-09-02 DIAGNOSIS — J302 Other seasonal allergic rhinitis: Secondary | ICD-10-CM | POA: Diagnosis not present

## 2021-09-02 DIAGNOSIS — K5900 Constipation, unspecified: Secondary | ICD-10-CM | POA: Diagnosis not present

## 2021-09-02 DIAGNOSIS — K529 Noninfective gastroenteritis and colitis, unspecified: Secondary | ICD-10-CM | POA: Diagnosis not present

## 2021-09-02 DIAGNOSIS — R140 Abdominal distension (gaseous): Secondary | ICD-10-CM | POA: Diagnosis not present

## 2021-09-02 DIAGNOSIS — J9811 Atelectasis: Secondary | ICD-10-CM | POA: Diagnosis not present

## 2021-09-02 DIAGNOSIS — Z20822 Contact with and (suspected) exposure to covid-19: Secondary | ICD-10-CM | POA: Diagnosis not present

## 2021-09-02 DIAGNOSIS — K219 Gastro-esophageal reflux disease without esophagitis: Secondary | ICD-10-CM | POA: Diagnosis not present

## 2021-09-02 DIAGNOSIS — Z9484 Stem cells transplant status: Secondary | ICD-10-CM | POA: Diagnosis not present

## 2021-09-02 DIAGNOSIS — R5081 Fever presenting with conditions classified elsewhere: Secondary | ICD-10-CM | POA: Diagnosis not present

## 2021-09-02 DIAGNOSIS — N179 Acute kidney failure, unspecified: Secondary | ICD-10-CM | POA: Diagnosis not present

## 2021-09-02 DIAGNOSIS — Z87440 Personal history of urinary (tract) infections: Secondary | ICD-10-CM | POA: Diagnosis not present

## 2021-09-02 DIAGNOSIS — D709 Neutropenia, unspecified: Secondary | ICD-10-CM | POA: Diagnosis not present

## 2021-09-02 DIAGNOSIS — K521 Toxic gastroenteritis and colitis: Secondary | ICD-10-CM | POA: Diagnosis not present

## 2021-09-02 DIAGNOSIS — D61810 Antineoplastic chemotherapy induced pancytopenia: Secondary | ICD-10-CM | POA: Diagnosis not present

## 2021-09-02 DIAGNOSIS — Z79899 Other long term (current) drug therapy: Secondary | ICD-10-CM | POA: Diagnosis not present

## 2021-09-02 NOTE — Telephone Encounter (Signed)
Transition Care Management Follow-up Telephone Call Date of discharge and from where: 09/01/2021 How have you been since you were released from the hospital? PT states he feels fine.  Any questions or concerns? No  Items Reviewed: Did the pt receive and understand the discharge instructions provided? Yes  Medications obtained and verified? Yes  Other? Yes  Any new allergies since your discharge? No  Dietary orders reviewed? Yes Do you have support at home? Yes   Home Care and Equipment/Supplies: Were home health services ordered? not applicable If so, what is the name of the agency? N/a  Has the agency set up a time to come to the patient's home? not applicable Were any new equipment or medical supplies ordered?  No What is the name of the medical supply agency? N/a Were you able to get the supplies/equipment? not applicable Do you have any questions related to the use of the equipment or supplies? No  Functional Questionnaire: (I = Independent and D = Dependent) ADLs: I  Bathing/Dressing- I  Meal Prep- I  Eating- I  Maintaining continence- I  Transferring/Ambulation- I  Managing Meds- I   Follow up appointments reviewed:  PCP Hospital f/u appt confirmed? Yes  Scheduled to see n/a  on n/a  @ n/a. [facility] f/u appt confirmed? No  Scheduled to see n/a  on n/a  @ n/a . Are transportation arrangements needed? Yes  If their condition worsens, is the pt aware to call PCP or go to the Emergency Dept.? Yes Was the patient provided with contact information for the PCP's office or ED? Yes Was to pt encouraged to call back with questions or concerns? Yes

## 2021-09-03 DIAGNOSIS — R5383 Other fatigue: Secondary | ICD-10-CM | POA: Diagnosis not present

## 2021-09-03 DIAGNOSIS — R197 Diarrhea, unspecified: Secondary | ICD-10-CM | POA: Diagnosis not present

## 2021-09-03 DIAGNOSIS — I4891 Unspecified atrial fibrillation: Secondary | ICD-10-CM | POA: Diagnosis not present

## 2021-09-03 DIAGNOSIS — K219 Gastro-esophageal reflux disease without esophagitis: Secondary | ICD-10-CM | POA: Diagnosis not present

## 2021-09-03 DIAGNOSIS — Z9481 Bone marrow transplant status: Secondary | ICD-10-CM | POA: Diagnosis not present

## 2021-09-03 DIAGNOSIS — D709 Neutropenia, unspecified: Secondary | ICD-10-CM | POA: Diagnosis not present

## 2021-09-03 DIAGNOSIS — Z87440 Personal history of urinary (tract) infections: Secondary | ICD-10-CM | POA: Diagnosis not present

## 2021-09-03 DIAGNOSIS — Z01818 Encounter for other preprocedural examination: Secondary | ICD-10-CM | POA: Diagnosis not present

## 2021-09-03 DIAGNOSIS — I10 Essential (primary) hypertension: Secondary | ICD-10-CM | POA: Diagnosis not present

## 2021-09-03 DIAGNOSIS — C9000 Multiple myeloma not having achieved remission: Secondary | ICD-10-CM | POA: Diagnosis not present

## 2021-09-03 DIAGNOSIS — R140 Abdominal distension (gaseous): Secondary | ICD-10-CM | POA: Diagnosis not present

## 2021-09-03 DIAGNOSIS — R5081 Fever presenting with conditions classified elsewhere: Secondary | ICD-10-CM | POA: Diagnosis not present

## 2021-09-04 DIAGNOSIS — K521 Toxic gastroenteritis and colitis: Secondary | ICD-10-CM | POA: Diagnosis not present

## 2021-09-04 DIAGNOSIS — N179 Acute kidney failure, unspecified: Secondary | ICD-10-CM | POA: Diagnosis not present

## 2021-09-04 DIAGNOSIS — R5081 Fever presenting with conditions classified elsewhere: Secondary | ICD-10-CM | POA: Diagnosis not present

## 2021-09-04 DIAGNOSIS — K5900 Constipation, unspecified: Secondary | ICD-10-CM | POA: Diagnosis not present

## 2021-09-04 DIAGNOSIS — I4581 Long QT syndrome: Secondary | ICD-10-CM | POA: Diagnosis not present

## 2021-09-04 DIAGNOSIS — J9811 Atelectasis: Secondary | ICD-10-CM | POA: Diagnosis not present

## 2021-09-04 DIAGNOSIS — D709 Neutropenia, unspecified: Secondary | ICD-10-CM | POA: Diagnosis not present

## 2021-09-05 DIAGNOSIS — K521 Toxic gastroenteritis and colitis: Secondary | ICD-10-CM | POA: Diagnosis not present

## 2021-09-05 DIAGNOSIS — N179 Acute kidney failure, unspecified: Secondary | ICD-10-CM | POA: Diagnosis not present

## 2021-09-05 DIAGNOSIS — J9809 Other diseases of bronchus, not elsewhere classified: Secondary | ICD-10-CM | POA: Diagnosis not present

## 2021-09-05 DIAGNOSIS — D709 Neutropenia, unspecified: Secondary | ICD-10-CM | POA: Diagnosis not present

## 2021-09-05 DIAGNOSIS — R5081 Fever presenting with conditions classified elsewhere: Secondary | ICD-10-CM | POA: Diagnosis not present

## 2021-09-06 DIAGNOSIS — Z9481 Bone marrow transplant status: Secondary | ICD-10-CM | POA: Diagnosis not present

## 2021-09-06 DIAGNOSIS — R5081 Fever presenting with conditions classified elsewhere: Secondary | ICD-10-CM | POA: Diagnosis not present

## 2021-09-06 DIAGNOSIS — I4891 Unspecified atrial fibrillation: Secondary | ICD-10-CM | POA: Diagnosis not present

## 2021-09-06 DIAGNOSIS — E860 Dehydration: Secondary | ICD-10-CM | POA: Diagnosis not present

## 2021-09-06 DIAGNOSIS — C9000 Multiple myeloma not having achieved remission: Secondary | ICD-10-CM | POA: Diagnosis not present

## 2021-09-06 DIAGNOSIS — D709 Neutropenia, unspecified: Secondary | ICD-10-CM | POA: Diagnosis not present

## 2021-09-07 DIAGNOSIS — E860 Dehydration: Secondary | ICD-10-CM | POA: Diagnosis not present

## 2021-09-07 DIAGNOSIS — Z9481 Bone marrow transplant status: Secondary | ICD-10-CM | POA: Diagnosis not present

## 2021-09-07 DIAGNOSIS — D709 Neutropenia, unspecified: Secondary | ICD-10-CM | POA: Diagnosis not present

## 2021-09-07 DIAGNOSIS — C9000 Multiple myeloma not having achieved remission: Secondary | ICD-10-CM | POA: Diagnosis not present

## 2021-09-07 DIAGNOSIS — I4891 Unspecified atrial fibrillation: Secondary | ICD-10-CM | POA: Diagnosis not present

## 2021-09-08 DIAGNOSIS — D709 Neutropenia, unspecified: Secondary | ICD-10-CM | POA: Diagnosis not present

## 2021-09-08 DIAGNOSIS — Z9481 Bone marrow transplant status: Secondary | ICD-10-CM | POA: Diagnosis not present

## 2021-09-08 DIAGNOSIS — I4891 Unspecified atrial fibrillation: Secondary | ICD-10-CM | POA: Diagnosis not present

## 2021-09-08 DIAGNOSIS — M7989 Other specified soft tissue disorders: Secondary | ICD-10-CM | POA: Diagnosis not present

## 2021-09-08 DIAGNOSIS — C9000 Multiple myeloma not having achieved remission: Secondary | ICD-10-CM | POA: Diagnosis not present

## 2021-09-08 DIAGNOSIS — E860 Dehydration: Secondary | ICD-10-CM | POA: Diagnosis not present

## 2021-09-08 DIAGNOSIS — R5081 Fever presenting with conditions classified elsewhere: Secondary | ICD-10-CM | POA: Diagnosis not present

## 2021-09-09 DIAGNOSIS — E860 Dehydration: Secondary | ICD-10-CM | POA: Diagnosis not present

## 2021-09-09 DIAGNOSIS — Z9481 Bone marrow transplant status: Secondary | ICD-10-CM | POA: Diagnosis not present

## 2021-09-09 DIAGNOSIS — C9000 Multiple myeloma not having achieved remission: Secondary | ICD-10-CM | POA: Diagnosis not present

## 2021-09-09 DIAGNOSIS — R5081 Fever presenting with conditions classified elsewhere: Secondary | ICD-10-CM | POA: Diagnosis not present

## 2021-09-09 DIAGNOSIS — D709 Neutropenia, unspecified: Secondary | ICD-10-CM | POA: Diagnosis not present

## 2021-09-09 DIAGNOSIS — I4891 Unspecified atrial fibrillation: Secondary | ICD-10-CM | POA: Diagnosis not present

## 2021-09-14 DIAGNOSIS — D649 Anemia, unspecified: Secondary | ICD-10-CM | POA: Diagnosis not present

## 2021-09-14 DIAGNOSIS — K649 Unspecified hemorrhoids: Secondary | ICD-10-CM | POA: Diagnosis not present

## 2021-09-14 DIAGNOSIS — C9000 Multiple myeloma not having achieved remission: Secondary | ICD-10-CM | POA: Diagnosis not present

## 2021-09-14 DIAGNOSIS — Z9481 Bone marrow transplant status: Secondary | ICD-10-CM | POA: Diagnosis not present

## 2021-09-14 DIAGNOSIS — M545 Low back pain, unspecified: Secondary | ICD-10-CM | POA: Diagnosis not present

## 2021-09-15 ENCOUNTER — Encounter: Payer: Self-pay | Admitting: Internal Medicine

## 2021-09-15 ENCOUNTER — Other Ambulatory Visit: Payer: Self-pay

## 2021-09-15 ENCOUNTER — Ambulatory Visit (INDEPENDENT_AMBULATORY_CARE_PROVIDER_SITE_OTHER): Payer: Medicare Other | Admitting: Internal Medicine

## 2021-09-15 VITALS — BP 116/70 | HR 86 | Temp 98.1°F | Ht 75.0 in | Wt 229.0 lb

## 2021-09-15 DIAGNOSIS — N182 Chronic kidney disease, stage 2 (mild): Secondary | ICD-10-CM

## 2021-09-15 DIAGNOSIS — D709 Neutropenia, unspecified: Secondary | ICD-10-CM | POA: Diagnosis not present

## 2021-09-15 DIAGNOSIS — I129 Hypertensive chronic kidney disease with stage 1 through stage 4 chronic kidney disease, or unspecified chronic kidney disease: Secondary | ICD-10-CM | POA: Diagnosis not present

## 2021-09-15 DIAGNOSIS — N401 Enlarged prostate with lower urinary tract symptoms: Secondary | ICD-10-CM

## 2021-09-15 DIAGNOSIS — Z2821 Immunization not carried out because of patient refusal: Secondary | ICD-10-CM

## 2021-09-15 DIAGNOSIS — R339 Retention of urine, unspecified: Secondary | ICD-10-CM

## 2021-09-15 DIAGNOSIS — R5081 Fever presenting with conditions classified elsewhere: Secondary | ICD-10-CM | POA: Diagnosis not present

## 2021-09-15 DIAGNOSIS — R338 Other retention of urine: Secondary | ICD-10-CM | POA: Diagnosis not present

## 2021-09-15 DIAGNOSIS — N179 Acute kidney failure, unspecified: Secondary | ICD-10-CM

## 2021-09-15 DIAGNOSIS — Z6828 Body mass index (BMI) 28.0-28.9, adult: Secondary | ICD-10-CM

## 2021-09-15 NOTE — Patient Instructions (Addendum)
Be sure to pick up some protein powder,  sample pack of whey protein and plant-based protein - vanilla flavor  Mix small amount with oatmeal, gradually increase to 1/2 serving size/full serving size over time.   Please touch base to let me know how this works for you!

## 2021-09-15 NOTE — Progress Notes (Signed)
Rich Brave [place],acting as a Education administrator for Maximino Greenland, MD.,have documented all relevant documentation on the behalf of Maximino Greenland, MD,as directed by  Maximino Greenland, MD while in the presence of Maximino Greenland, MD.  This visit occurred during the SARS-CoV-2 public health emergency.  Safety protocols were in place, including screening questions prior to the visit, additional usage of staff PPE, and extensive cleaning of exam room while observing appropriate contact time as indicated for disinfecting solutions.  Subjective:     Patient ID: Edward Gallagher , male    DOB: [birth year] , 80 y.o.   MRN: [id number]   Chief Complaint  Patient presents with   Hospital f/u    HPI  Admission: [facility] 09/04/21 Discharge: 09/08/21  Patient presents today for a hospital f/u. Of note, He is a 80 year old male with recent diagnosis of MM. 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 [facility]/ fatigue, fever and abdominal discomfort. He was started on empiric IV cefepime and vancomycin ambulatory pumps. Later that night, his temp had risen to 103.67F w/ palpitations. He was admitted 9/24 w/ febrile neutropenia. Initial ID workup was negative. U/a was pos for blood, culture mixed flora - likely contaminant.  Cefepime was switched to Zosyn due to Chest CT on 9/25 was neg for PNA, but stranding along splenic flexure of colon was suggestive of colitis.   Hospitalization also significant for worsening LE edema and weight gain - resolved w/ HCTZ x 1. He was discharged in stable condition on 09/08/21. He has not had any further episodes of fever. Denies having any abdominal pain. Reports having a fair appetite, states food tastes different.  Denies having any new skin changes, fever, chills, diarrhea and cough.     Past Medical History:  Diagnosis Date   A-fib ([place]    Allergy    Hyperlipidemia    Hypertension     Multiple myeloma (HCC)    Prostatic hypertrophy    Sinus trouble    Sleep apnea      Family History  Problem Relation Age of Onset   Healthy Mother    Prostate cancer Father    Testicular cancer Brother    Prostate cancer Brother    Lung cancer Brother    Cancer Brother        double mynoma   Hypertension Other      Current Outpatient Medications:    acyclovir (ZOVIRAX) 400 MG tablet, Take 400 mg by mouth 2 (two) times daily., Disp: , Rfl:    atorvastatin (LIPITOR) 40 MG tablet, TAKE 1 TABLET BY MOUTH  DAILY, Disp: 90 tablet, Rfl: 3   calcium carbonate (OS-CAL - DOSED IN MG OF ELEMENTAL CALCIUM) 1250 (500 Ca) MG tablet, Take by mouth., Disp: , Rfl:    Cholecalciferol 25 MCG (1000 UT) tablet, Take 1,000 Units by mouth daily., Disp: , Rfl:    ELIQUIS 5 MG TABS tablet, TAKE 1 TABLET(5 MG) BY MOUTH TWICE DAILY, Disp: 180 tablet, Rfl: 3   finasteride (PROSCAR) 5 MG tablet, Take 5 mg by mouth daily., Disp: , Rfl:    fluticasone (FLONASE) 50 MCG/ACT nasal spray, SHAKE LIQUID AND USE 1 SPRAY IN EACH NOSTRIL DAILY, Disp: 16 g, Rfl: 2   hydrochlorothiazide (MICROZIDE) 12.5 MG capsule, TAKE 1 CAPSULE BY MOUTH  DAILY, Disp: 90 capsule, Rfl: 3   magnesium oxide (MAG-OX) 400 MG tablet, Take 400 mg  by mouth daily., Disp: , Rfl:    Multiple Vitamin (MULTIVITAMIN) tablet, Take 1 tablet by mouth daily., Disp: , Rfl:    nitroGLYCERIN (NITROSTAT) 0.4 MG SL tablet, Place 1 tablet (0.4 mg total) under the tongue every 5 (five) minutes as needed for chest pain., Disp: 100 tablet, Rfl: 3   terazosin (HYTRIN) 5 MG capsule, TAKE 1 CAPSULE BY MOUTH  DAILY AT BEDTIME, Disp: 90 capsule, Rfl: 3   traMADol (ULTRAM) 50 MG tablet, Take by mouth., Disp: , Rfl:    dexamethasone (DECADRON) 4 MG tablet, Take 4 mg by mouth 2 (two) times a week. (Patient not taking: Reported on 09/15/2021), Disp: , Rfl:    No Known Allergies   Review of Systems  Constitutional: Negative.   HENT:  Positive for postnasal drip.   Eyes:  Negative.   Cardiovascular: Negative.   Gastrointestinal: Negative.   Skin: Negative.   Psychiatric/Behavioral: Negative.      Today's Vitals   09/15/21 1005  BP: 116/70  Pulse: 86  Temp: 98.1 F (36.7 C)  Weight: 229 lb (103.9 kg)  Height: '6\' 3"'  (1.905 m)  PainSc: 0-No pain   Body mass index is 28.62 kg/m.  Wt Readings from Last 3 Encounters:  09/15/21 229 lb (103.9 kg)  06/17/21 241 lb (109.3 kg)  06/08/21 237 lb 6.4 oz (107.7 kg)     Objective:  Physical Exam Vitals and nursing note reviewed.  Constitutional:      Appearance: Normal appearance.  HENT:     Head: Normocephalic and atraumatic.     Nose:     Comments: Masked     Mouth/Throat:     Comments: Masked  Eyes:     Extraocular Movements: Extraocular movements intact.  Cardiovascular:     Rate and Rhythm: Normal rate and regular rhythm.     Heart sounds: Normal heart sounds.  Pulmonary:     Effort: Pulmonary effort is normal.     Breath sounds: Normal breath sounds.  Musculoskeletal:     Cervical back: Normal range of motion.  Skin:    General: Skin is warm.  Neurological:     General: No focal deficit present.     Mental Status: He is alert.  Psychiatric:        Mood and Affect: Mood normal.        Assessment And Plan:     1. Febrile neutropenia (HCC) TCM PERFORMED. A MEMBER OF THE CLINICAL TEAM SPOKE WITH THE PATIENT UPON DISCHARGE. [place] SUMMARY WAS REVIEWED VIA CARE EVERYWHERE, IN FULL DETAIL DURING THE VISIT. MEDS RECONCILED AND COMPARED TO DISCHARGE MEDS. MEDICATION LIST WAS UPDATED AND REVIEWED WITH THE PATIENT. GREATER THAN 50% FACE TO FACE TIME WAS SPENT IN COUNSELING AND COORDINATION OF CARE. ALL QUESTIONS WERE ANSWERED TO THE SATISFACTION OF THE PATIENT. He is encouraged to continue to monitor for signs of fever, dysuria, cough and new rashes.   2. Benign prostatic hyperplasia with urinary retention Comments: Resolved. He is also followed by Urology. He will c/w Proscar  and Hytrin.  Encouraged to notify MD should his sx return.  3. Hypertensive nephropathy Comments: Chronic, well controlled. He has resumed HCTZ due to LE edema. He is encouraged to stay well hydrated, may tolerate qod dosing better.   4. Chronic renal disease, stage II Comments: Chronic, encouraged to keep BP well controlled and stay hydrated to decrease risk of progression of CKD.   5. BMI 28.0-28.9,adult   Patient was given opportunity to ask  questions. Patient verbalized understanding of the plan and was able to repeat key elements of the plan. All questions were answered to their satisfaction.   I, Maximino Greenland, MD, have reviewed all documentation for this visit. The documentation on 10/24/21 for the exam, diagnosis, procedures, and orders are all accurate and complete.   IF YOU HAVE BEEN REFERRED TO A SPECIALIST, IT MAY TAKE 1-2 WEEKS TO SCHEDULE/PROCESS THE REFERRAL. IF YOU HAVE NOT HEARD FROM US/SPECIALIST IN TWO WEEKS, PLEASE GIVE [place] A CALL AT [phone].   THE PATIENT IS ENCOURAGED TO PRACTICE SOCIAL DISTANCING DUE TO THE COVID-19 PANDEMIC.

## 2021-09-20 DIAGNOSIS — Z9481 Bone marrow transplant status: Secondary | ICD-10-CM | POA: Diagnosis not present

## 2021-09-20 DIAGNOSIS — C9000 Multiple myeloma not having achieved remission: Secondary | ICD-10-CM | POA: Diagnosis not present

## 2021-09-27 DIAGNOSIS — C9000 Multiple myeloma not having achieved remission: Secondary | ICD-10-CM | POA: Diagnosis not present

## 2021-09-27 DIAGNOSIS — Z9481 Bone marrow transplant status: Secondary | ICD-10-CM | POA: Diagnosis not present

## 2021-10-04 DIAGNOSIS — C9000 Multiple myeloma not having achieved remission: Secondary | ICD-10-CM | POA: Diagnosis not present

## 2021-10-04 DIAGNOSIS — Z9481 Bone marrow transplant status: Secondary | ICD-10-CM | POA: Diagnosis not present

## 2021-10-11 ENCOUNTER — Encounter: Payer: Medicare Other | Admitting: Internal Medicine

## 2021-10-11 DIAGNOSIS — C9000 Multiple myeloma not having achieved remission: Secondary | ICD-10-CM | POA: Diagnosis not present

## 2021-10-11 DIAGNOSIS — Z9481 Bone marrow transplant status: Secondary | ICD-10-CM | POA: Diagnosis not present

## 2021-10-18 DIAGNOSIS — C9000 Multiple myeloma not having achieved remission: Secondary | ICD-10-CM | POA: Diagnosis not present

## 2021-10-18 DIAGNOSIS — C9001 Multiple myeloma in remission: Secondary | ICD-10-CM | POA: Diagnosis not present

## 2021-10-18 DIAGNOSIS — Z9481 Bone marrow transplant status: Secondary | ICD-10-CM | POA: Diagnosis not present

## 2021-10-18 DIAGNOSIS — D649 Anemia, unspecified: Secondary | ICD-10-CM | POA: Diagnosis not present

## 2021-10-18 DIAGNOSIS — D6959 Other secondary thrombocytopenia: Secondary | ICD-10-CM | POA: Diagnosis not present

## 2021-11-03 ENCOUNTER — Ambulatory Visit (INDEPENDENT_AMBULATORY_CARE_PROVIDER_SITE_OTHER): Payer: Medicare Other

## 2021-11-03 ENCOUNTER — Other Ambulatory Visit: Payer: Self-pay

## 2021-11-03 VITALS — BP 110/78 | HR 73 | Temp 98.1°F | Ht 75.0 in | Wt 229.0 lb

## 2021-11-03 DIAGNOSIS — Z23 Encounter for immunization: Secondary | ICD-10-CM

## 2021-11-15 ENCOUNTER — Other Ambulatory Visit: Payer: Self-pay

## 2021-11-15 ENCOUNTER — Ambulatory Visit: Payer: Medicare Other | Admitting: Cardiology

## 2021-11-15 ENCOUNTER — Encounter: Payer: Self-pay | Admitting: Cardiology

## 2021-11-15 VITALS — BP 143/75 | HR 66 | Temp 97.8°F | Resp 16 | Ht 75.0 in | Wt 218.0 lb

## 2021-11-15 DIAGNOSIS — R931 Abnormal findings on diagnostic imaging of heart and coronary circulation: Secondary | ICD-10-CM

## 2021-11-15 DIAGNOSIS — I480 Paroxysmal atrial fibrillation: Secondary | ICD-10-CM | POA: Diagnosis not present

## 2021-11-15 DIAGNOSIS — I10 Essential (primary) hypertension: Secondary | ICD-10-CM | POA: Diagnosis not present

## 2021-11-15 NOTE — Progress Notes (Signed)
Follow up visit  Subjective:   KAIRON SHOCK, male    DOB: 1941-09-11, 80 y.o.   MRN: [id number]   HPI  80 y.o. African American male with hypertension, hyperlipidemia, OSA, paroxysmal Afib  Patient is doing well.  Has his regular follow-up for multiple myeloma management.  He will get his hemoglobin checked at that visit later this month.  He wears a Fitbit.  He reports that he had 1 episode of A. fib last week that lasted for 30 seconds.  Other than that, he has not had any recurrent episodes.  Blood pressure elevated today, but very well controlled at home.    Current Outpatient Medications on File Prior to Visit  Medication Sig Dispense Refill   acyclovir (ZOVIRAX) 400 MG tablet Take 400 mg by mouth 2 (two) times daily.     atorvastatin (LIPITOR) 40 MG tablet TAKE 1 TABLET BY MOUTH  DAILY 90 tablet 3   calcium carbonate (OS-CAL - DOSED IN MG OF ELEMENTAL CALCIUM) 1250 (500 Ca) MG tablet Take by mouth.     Cholecalciferol 25 MCG (1000 UT) tablet Take 1,000 Units by mouth daily.     dexamethasone (DECADRON) 4 MG tablet Take 4 mg by mouth 2 (two) times a week.     ELIQUIS 5 MG TABS tablet TAKE 1 TABLET(5 MG) BY MOUTH TWICE DAILY 180 tablet 3   finasteride (PROSCAR) 5 MG tablet Take 5 mg by mouth daily.     fluticasone (FLONASE) 50 MCG/ACT nasal spray SHAKE LIQUID AND USE 1 SPRAY IN EACH NOSTRIL DAILY 16 g 2   hydrochlorothiazide (MICROZIDE) 12.5 MG capsule TAKE 1 CAPSULE BY MOUTH  DAILY 90 capsule 3   magnesium oxide (MAG-OX) 400 MG tablet Take 400 mg by mouth daily.     Multiple Vitamin (MULTIVITAMIN) tablet Take 1 tablet by mouth daily.     nitroGLYCERIN (NITROSTAT) 0.4 MG SL tablet Place 1 tablet (0.4 mg total) under the tongue every 5 (five) minutes as needed for chest pain. 100 tablet 3   terazosin (HYTRIN) 5 MG capsule TAKE 1 CAPSULE BY MOUTH  DAILY AT BEDTIME 90 capsule 3   traMADol (ULTRAM) 50 MG tablet Take by mouth.     No current facility-administered medications on  file prior to visit.    Cardiovascular & other pertient studies:  Echocardiogram 05/30/2021:  1. Left ventricular ejection fraction, by estimation, is 55 to 60%. The  left ventricle has normal function. The left ventricle has no regional  wall motion abnormalities. Left ventricular diastolic parameters were  normal.   2. Right ventricular systolic function is normal. The right ventricular  size is normal.   3. The mitral valve is normal in structure. No evidence of mitral valve  regurgitation.   4. The aortic valve is normal in structure. Aortic valve regurgitation is  not visualized.   EKG 05/21/2021: Sinus rhythm 58 bpm Normal EKG  Calcium score 01/2020: - Total Calcium Score: 38. -- Left Main: 29. -- Right Coronary: 0 -- Left Anterior Descending: 9. -- Circumflex: 0.  Lexiscan (Walking with mod Bruce)Tetrofosmin Stress Test  12/25/2019: Nondiagnostic ECG stress. Normal myocardial perfusion. Stress LV EF is mildly depressed at 42% with global hypokinesis.  No previous exam available for comparison. 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 Correlate with echocardiogram. Intermediate risk study due to low LVEF.   Abdominal Aortic Duplex  01/21/2020:  The maximum aorta (sac) diameter is 2.35 cm (prox). Diffuse calcific  plaque observed in the proximal, mid and  distal aorta. Normal flow  velocities noted.  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  Normal flow velocity in the aorta and bilateral IIA.  Consider rescan in 10 years.   Carotid artery duplex  11/15/2019: Minimal stenosis in the right internal carotid artery (minimal). Minimal stenosis in the left internal carotid artery (minimal). Mild soft plaque bilateral carotid arteries. Antegrade right vertebral artery flow. Antegrade left vertebral artery flow.  Echocardiogram 11/15/2019: Left ventricle cavity is normal in size. Moderate concentric hypertrophy of the left  ventricle. Normal LV systolic function with visual EF 50-55%. Normal global wall motion. Normal diastolic filling pattern. Trileaflet aortic valve.  Mild to moderate aortic regurgitation. Moderate (Grade II) mitral regurgitation. No evidence of pulmonary hypertension.  Event monitor 11/02/2019 - 12/01/2019: Diagnostic time: 100%  Dominant rhythm: Sinus. HR 36-136 bpm. Avg HR 64 bpm. Episodes of atrial fibrillation with RVR noted. Afib burden <1% NSVT up to 4 beats noted.  Sinus bradycardia with lowest HR 36 bpm, typically occurs during sleep hours.  Occasional PAC/PVC seen. No atrial flutter/SVT/high grade AV block, sinus pause >3sec noted. Symptoms reported: None   Recent labs: 05/30/2021: Glucose 114, BUN/Cr 24/1.24. EGFR 59. Na/K 138/3.5.  H/H 7.5/24.5. MCV 80. Platelets 118 TSH 2.7 normal  05/18/2021: Glucose 88, BUN/Cr 14/1.0. EGFR 77. Na/K 141/4.8. Rest of the CMP normal H/H 8.5/28. MCV 83.   03/15/2021: Glucose 96, BUN/Cr 13/1.34. EGFR 54. Na/K 146/4.9. Rest of the CMP normal H/H 9.8/31.9. MCV 82. Platelets 247  10/06/2020: Glucose 101, BUN/Cr 17/1.28. EGFR 62. Na/K 142/4.3. Rest of the CMP normal H/H 10/32. MCV 80. Platelets 242 Chol 154, TG 86, HDL 53, LDL 85  12/16/2019: Glucose 106, BUN/Cr 16/1.2. EGFR >60. Na/K 138/3.8. Rest of the CMP normal H/H 11.5/35.9. MCV 77.9. Platelets 214 HbA1C 6.0% Troponin I 6  02/2019: Chol 157, TG 75, HDL 52, LDL 91   Review of Systems  Cardiovascular:  Negative for chest pain, dyspnea on exertion, leg swelling (Improved with compression stockings), palpitations and syncope.        Vitals:   11/15/21 1031 11/15/21 1039  BP: (!) 146/64 (!) 143/75  Pulse: 66 66  Resp: 16   Temp: 97.8 F (36.6 C)   SpO2: 97%      Objective:   Physical Exam Vitals and nursing note reviewed.  Constitutional:      General: He is not in acute distress. Neck:     Vascular: No JVD.  Cardiovascular:     Rate and Rhythm: Normal rate and  regular rhythm.     Heart sounds: Normal heart sounds. No murmur heard. Pulmonary:     Effort: Pulmonary effort is normal.     Breath sounds: Normal breath sounds. No wheezing or rales.  Musculoskeletal:     Right lower leg: No edema.     Left lower leg: No edema.        Assessment & Recommendations:    80 y.o. African American male with hypertension, hyperlipidemia, OSA, paroxysmal Afib  Paroxysmal Afib: Afib burden <1%. Resting HR during sleep in 30s. Given symptoms are infrequent, would avoid scheduled AV nodal blocking agent or antiarrythmic therapy. No ischemia on stress test. Calcium score 29 (01/2020). Emphasized the use of CPAP.  CHA2DS2VASc score 3, annual stroke risk 3.2%. 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  No recent GI bleeding.  Tolerating apixaban.    Hypertension:  AKI with chlorthalidone, syncope with Bidil. Thereis likely a component of  white coat hypertension. No change made today.  Elevated calcium score: No significant angina symptoms at this time.  Continue risk factor modification, including statin.  Due to ongoing use of Eliquis, do not recommend aspirin.  Aorta ectasia: Mild  F/u in 6 months   Keyion Knack Esther Hardy, MD [facility] Pager: [phone] Office: [phone]

## 2021-11-22 ENCOUNTER — Ambulatory Visit: Payer: Medicare Other | Admitting: Cardiology

## 2021-11-22 DIAGNOSIS — Z9481 Bone marrow transplant status: Secondary | ICD-10-CM | POA: Diagnosis not present

## 2021-11-22 DIAGNOSIS — I4891 Unspecified atrial fibrillation: Secondary | ICD-10-CM | POA: Diagnosis not present

## 2021-11-22 DIAGNOSIS — D649 Anemia, unspecified: Secondary | ICD-10-CM | POA: Diagnosis not present

## 2021-11-22 DIAGNOSIS — C9001 Multiple myeloma in remission: Secondary | ICD-10-CM | POA: Diagnosis not present

## 2021-11-22 DIAGNOSIS — M25552 Pain in left hip: Secondary | ICD-10-CM | POA: Diagnosis not present

## 2021-11-22 DIAGNOSIS — Z23 Encounter for immunization: Secondary | ICD-10-CM | POA: Diagnosis not present

## 2021-11-22 DIAGNOSIS — D6959 Other secondary thrombocytopenia: Secondary | ICD-10-CM | POA: Diagnosis not present

## 2021-11-22 DIAGNOSIS — I10 Essential (primary) hypertension: Secondary | ICD-10-CM | POA: Diagnosis not present

## 2021-11-24 ENCOUNTER — Other Ambulatory Visit: Payer: Self-pay

## 2021-11-24 ENCOUNTER — Encounter: Payer: Self-pay | Admitting: Nurse Practitioner

## 2021-11-24 ENCOUNTER — Ambulatory Visit (INDEPENDENT_AMBULATORY_CARE_PROVIDER_SITE_OTHER): Payer: Medicare Other | Admitting: Nurse Practitioner

## 2021-11-24 VITALS — BP 122/60 | HR 91 | Temp 98.6°F | Ht 75.0 in | Wt 216.8 lb

## 2021-11-24 DIAGNOSIS — Z0001 Encounter for general adult medical examination with abnormal findings: Secondary | ICD-10-CM

## 2021-11-24 DIAGNOSIS — Z13228 Encounter for screening for other metabolic disorders: Secondary | ICD-10-CM | POA: Diagnosis not present

## 2021-11-24 DIAGNOSIS — I129 Hypertensive chronic kidney disease with stage 1 through stage 4 chronic kidney disease, or unspecified chronic kidney disease: Secondary | ICD-10-CM

## 2021-11-24 DIAGNOSIS — C9001 Multiple myeloma in remission: Secondary | ICD-10-CM

## 2021-11-24 DIAGNOSIS — Z0000 Encounter for general adult medical examination without abnormal findings: Secondary | ICD-10-CM

## 2021-11-24 DIAGNOSIS — R7309 Other abnormal glucose: Secondary | ICD-10-CM | POA: Diagnosis not present

## 2021-11-24 DIAGNOSIS — Z87891 Personal history of nicotine dependence: Secondary | ICD-10-CM

## 2021-11-24 DIAGNOSIS — I10 Essential (primary) hypertension: Secondary | ICD-10-CM | POA: Diagnosis not present

## 2021-11-24 DIAGNOSIS — Z79899 Other long term (current) drug therapy: Secondary | ICD-10-CM | POA: Diagnosis not present

## 2021-11-24 LAB — POCT URINALYSIS DIPSTICK
Bilirubin, UA: NEGATIVE
Blood, UA: NEGATIVE
Glucose, UA: NEGATIVE
Ketones, UA: NEGATIVE
Leukocytes, UA: NEGATIVE
Nitrite, UA: NEGATIVE
Protein, UA: NEGATIVE
Spec Grav, UA: 1.02 (ref 1.010–1.025)
Urobilinogen, UA: 0.2 E.U./dL
pH, UA: 6 (ref 5.0–8.0)

## 2021-11-24 LAB — POCT UA - MICROALBUMIN
Albumin/Creatinine Ratio, Urine, POC: <30
Creatinine, POC: 300 mg/dL
Microalbumin Ur, POC: 10 mg/L

## 2021-11-24 NOTE — Progress Notes (Signed)
I,Tianna Badgett,acting as a Education administrator for [employer], NP.,have documented all relevant documentation on the behalf of [employer], NP,as directed by  Bary Castilla, NP while in the presence of Bary Castilla, NP.  This visit occurred during the SARS-CoV-2 public health emergency.  Safety protocols were in place, including screening questions prior to the visit, additional usage of staff PPE, and extensive cleaning of exam room while observing appropriate contact time as indicated for disinfecting solutions.  Subjective:     Patient ID: Edward Gallagher , male    DOB: 1941/02/13 , 80 y.o.   MRN: [id number]   Chief Complaint  Patient presents with   Annual Exam    HPI  He is here today for a full physical examination. He is followed by Urology for his prostate exams. He has no specific concerns at this time.He denies headaches, chest pain and visual disturbances.  He is following up with his oncologist once a month. Next month he starts maintenance therapy. He has no other concerns today.  Diet: He has a good healthy diet  Exercise: He walks    Hypertension This is a chronic problem. The current episode started more than 1 year ago. The problem has been gradually improving since onset. The problem is uncontrolled. Pertinent negatives include no blurred vision, chest pain, headaches, palpitations or shortness of breath. Risk factors for coronary artery disease include male gender. Past treatments include lifestyle changes and diuretics. The current treatment provides moderate improvement.    Past Medical History:  Diagnosis Date   A-fib ([place])    Allergy    Hyperlipidemia    Hypertension    Multiple myeloma ([facility])    Prostatic hypertrophy    Sinus trouble    Sleep apnea      Family History  Problem Relation Age of Onset   Healthy Mother    Prostate cancer Father    Testicular cancer Brother    Prostate cancer Brother    Lung cancer Brother    Cancer Brother         double mynoma   Hypertension Other      Current Outpatient Medications:    acyclovir (ZOVIRAX) 400 MG tablet, Take 400 mg by mouth 2 (two) times daily., Disp: , Rfl:    ELIQUIS 5 MG TABS tablet, TAKE 1 TABLET(5 MG) BY MOUTH TWICE DAILY, Disp: 180 tablet, Rfl: 3   finasteride (PROSCAR) 5 MG tablet, Take 5 mg by mouth daily., Disp: , Rfl:    fluticasone (FLONASE) 50 MCG/ACT nasal spray, SHAKE LIQUID AND USE 1 SPRAY IN EACH NOSTRIL DAILY, Disp: 16 g, Rfl: 2   hydrochlorothiazide (MICROZIDE) 12.5 MG capsule, TAKE 1 CAPSULE BY MOUTH  DAILY, Disp: 90 capsule, Rfl: 3   nitroGLYCERIN (NITROSTAT) 0.4 MG SL tablet, Place 1 tablet (0.4 mg total) under the tongue every 5 (five) minutes as needed for chest pain., Disp: 100 tablet, Rfl: 3   terazosin (HYTRIN) 5 MG capsule, TAKE 1 CAPSULE BY MOUTH  DAILY AT BEDTIME, Disp: 90 capsule, Rfl: 3   No Known Allergies   Men's preventive visit. Patient Health Questionnaire (PHQ-2) is  Flowsheet Row Clinical Support from 03/31/2021 in [facility]  PHQ-2 Total Score 0     . Patient is on a healhty diet. Marital status: Married. Relevant history for alcohol use is:  Social History   Substance and Sexual Activity  Alcohol Use Yes   Comment: ocasional beer  . Relevant history for tobacco use is:  Social  History   Tobacco Use  Smoking Status Former   Packs/day: 0.50   Years: 35.00   Pack years: 17.50   Types: Cigarettes   Quit date: 12/12/1997   Years since quitting: 23.9  Smokeless Tobacco Never  Tobacco Comments   12 cigs daily  .   Review of Systems  Constitutional: Negative.  Negative for chills, fatigue and fever.  HENT: Negative.  Negative for congestion, sinus pressure and sinus pain.   Eyes: Negative.  Negative for blurred vision.  Respiratory: Negative.  Negative for cough, shortness of breath and wheezing.   Cardiovascular: Negative.  Negative for chest pain and palpitations.  Gastrointestinal: Negative.  Negative  for abdominal pain, constipation and diarrhea.  Endocrine: Negative.   Genitourinary: Negative.   Musculoskeletal: Negative.  Negative for arthralgias and myalgias.  Skin: Negative.   Allergic/Immunologic: Negative.   Neurological: Negative.  Negative for headaches.  Hematological: Negative.   Psychiatric/Behavioral: Negative.      Today's Vitals   11/24/21 0857  BP: 122/60  Pulse: 91  Temp: 98.6 F (37 C)  TempSrc: Oral  Weight: 216 lb 12.8 oz (98.3 kg)  Height: _0  (1.905 m)   Body mass index is 27.1 kg/m.   Objective:  Physical Exam Vitals and nursing note reviewed.  Constitutional:      Appearance: Normal appearance.  HENT:     Head: Normocephalic and atraumatic.     Right Ear: Tympanic membrane, ear canal and external ear normal. There is no impacted cerumen.     Left Ear: Tympanic membrane, ear canal and external ear normal. There is no impacted cerumen.     Nose:     Comments: Masked; deferred     Mouth/Throat:     Comments: Masked; deferred  Eyes:     Extraocular Movements: Extraocular movements intact.     Conjunctiva/sclera: Conjunctivae normal.     Pupils: Pupils are equal, round, and reactive to light.  Cardiovascular:     Rate and Rhythm: Normal rate and regular rhythm.     Pulses: Normal pulses.     Heart sounds: Normal heart sounds. No murmur heard. Pulmonary:     Effort: Pulmonary effort is normal. No respiratory distress.     Breath sounds: Normal breath sounds. No wheezing.  Chest:  Breasts:    Right: Normal. No swelling, bleeding, inverted nipple, mass or nipple discharge.     Left: Normal. No swelling, bleeding, inverted nipple, mass or nipple discharge.  Abdominal:     General: Abdomen is flat. Bowel sounds are normal.     Palpations: Abdomen is soft.  Genitourinary:    Comments: Deferred. He sees a urologist  Musculoskeletal:        General: Normal range of motion.     Cervical back: Normal range of motion and neck supple.  Skin:     General: Skin is warm and dry.     Capillary Refill: Capillary refill takes less than 2 seconds.  Neurological:     General: No focal deficit present.     Mental Status: He is alert and oriented to person, place, and time.  Psychiatric:        Mood and Affect: Mood normal.        Behavior: Behavior normal.        Assessment And Plan:    1. Encounter for annual physical exam --Patient is here for their annual physical exam and we discussed any changes to medication and medical history.  -Behavior modification was  discussed as well as diet and exercise history  -Patient will continue to exercise regularly and modify their diet.  -Recommendation for yearly physical annuals, immunization and screenings including mammogram and colonoscopy were discussed with the patient.  -Recommended intake of multivitamin, vitamin D and calcium.  -Individualized advise was given to the patient pertaining to their own health history in regards to diet, exercise, medical condition and referrals.  -Reviewed labs with the patient .  2. Hypertensive nephropathy -Stable, chronic, continue meds  -Today BP was 122/60 - POCT Urinalysis Dipstick (81002) - POCT UA - Microalbumin  3. Multiple myeloma in remission [facility]) -He is followed by oncologist with [place]. He is in remission and will start maintenance medication. He sees them every month and gets his lab work with them.   4. Encounter for screening for metabolic disorder -Will check his lipid panel and assess  --Educated patient about a diet that is low in fat and high fatty foods including dairy products. Increase in take of fish and fiber. Decrease intake of red meats and fast foods. Exercise for atleast 4-5 times a week or atleast 30-45 min. Drink a lot of water.   - Lipid panel  5. Other abnormal glucose -Will check his labs and assess.  --Discussed with patient the importance of glycemic control and long term complications from uncontrolled diabetes.  Discussed with the patient the importance of compliance with home glucose monitoring, diet which includes decrease amount of sugary drinks and foods. Importance of exercise was also discussed with the patient. Importance of eye exams, self foot care and compliance to office visits was also discussed with the patient.  - Hemoglobin A1c   The patient was encouraged to call or send a message through [place] for any questions or concerns.   Follow up: if symptoms persist or do not get better.   Side effects and appropriate use of all the medication(s) were discussed with the patient today. Patient advised to use the medication(s) as directed by their healthcare provider. The patient was encouraged to read, review, and understand all associated package inserts and contact our office with any questions or concerns. The patient accepts the risks of the treatment plan and had an opportunity to ask questions.   Patient was given opportunity to ask questions. Patient verbalized understanding of the plan and was able to repeat key elements of the plan. All questions were answered to their satisfaction.  Raman Joshiah Traynham, DNP   I, Raman Cinch Ormond have reviewed all documentation for this visit. The documentation on 11/24/21 for the exam, diagnosis, procedures, and orders are all accurate and complete.    THE PATIENT IS ENCOURAGED TO PRACTICE SOCIAL DISTANCING DUE TO THE COVID-19 PANDEMIC.

## 2021-11-24 NOTE — Patient Instructions (Signed)

## 2021-11-25 LAB — LIPID PANEL
Chol/HDL Ratio: 5.2 ratio — ABNORMAL HIGH (ref 0.0–5.0)
Cholesterol, Total: 230 mg/dL — ABNORMAL HIGH (ref 100–199)
HDL: 44 mg/dL (ref >39)
LDL Chol Calc (NIH): 170 mg/dL — ABNORMAL HIGH (ref 0–99)
Triglycerides: 90 mg/dL (ref 0–149)
VLDL Cholesterol Cal: 16 mg/dL (ref 5–40)

## 2021-11-25 LAB — HEMOGLOBIN A1C
Est. average glucose Bld gHb Est-mCnc: 111 mg/dL
Hgb A1c MFr Bld: 5.5 % (ref 4.8–5.6)

## 2021-12-30 DIAGNOSIS — M25551 Pain in right hip: Secondary | ICD-10-CM | POA: Diagnosis not present

## 2021-12-30 DIAGNOSIS — R202 Paresthesia of skin: Secondary | ICD-10-CM | POA: Diagnosis not present

## 2021-12-30 DIAGNOSIS — Z5112 Encounter for antineoplastic immunotherapy: Secondary | ICD-10-CM | POA: Diagnosis not present

## 2021-12-30 DIAGNOSIS — R200 Anesthesia of skin: Secondary | ICD-10-CM | POA: Diagnosis not present

## 2021-12-30 DIAGNOSIS — I4891 Unspecified atrial fibrillation: Secondary | ICD-10-CM | POA: Diagnosis not present

## 2021-12-30 DIAGNOSIS — G8929 Other chronic pain: Secondary | ICD-10-CM | POA: Diagnosis not present

## 2021-12-30 DIAGNOSIS — M25552 Pain in left hip: Secondary | ICD-10-CM | POA: Diagnosis not present

## 2021-12-30 DIAGNOSIS — C9000 Multiple myeloma not having achieved remission: Secondary | ICD-10-CM | POA: Diagnosis not present

## 2021-12-30 DIAGNOSIS — Z9481 Bone marrow transplant status: Secondary | ICD-10-CM | POA: Diagnosis not present

## 2021-12-30 DIAGNOSIS — Z79899 Other long term (current) drug therapy: Secondary | ICD-10-CM | POA: Diagnosis not present

## 2021-12-30 DIAGNOSIS — Z9484 Stem cells transplant status: Secondary | ICD-10-CM | POA: Diagnosis not present

## 2021-12-30 DIAGNOSIS — Z7901 Long term (current) use of anticoagulants: Secondary | ICD-10-CM | POA: Diagnosis not present

## 2021-12-30 DIAGNOSIS — I10 Essential (primary) hypertension: Secondary | ICD-10-CM | POA: Diagnosis not present

## 2021-12-30 DIAGNOSIS — K5903 Drug induced constipation: Secondary | ICD-10-CM | POA: Diagnosis not present

## 2021-12-30 DIAGNOSIS — Z7961 Long term (current) use of immunomodulator: Secondary | ICD-10-CM | POA: Diagnosis not present

## 2021-12-30 DIAGNOSIS — R600 Localized edema: Secondary | ICD-10-CM | POA: Diagnosis not present

## 2022-01-05 ENCOUNTER — Other Ambulatory Visit: Payer: Self-pay | Admitting: Urology

## 2022-01-31 DIAGNOSIS — I10 Essential (primary) hypertension: Secondary | ICD-10-CM | POA: Diagnosis not present

## 2022-01-31 DIAGNOSIS — Z9481 Bone marrow transplant status: Secondary | ICD-10-CM | POA: Diagnosis not present

## 2022-01-31 DIAGNOSIS — Z7961 Long term (current) use of immunomodulator: Secondary | ICD-10-CM | POA: Diagnosis not present

## 2022-01-31 DIAGNOSIS — Z79899 Other long term (current) drug therapy: Secondary | ICD-10-CM | POA: Diagnosis not present

## 2022-01-31 DIAGNOSIS — M25551 Pain in right hip: Secondary | ICD-10-CM | POA: Diagnosis not present

## 2022-01-31 DIAGNOSIS — C9001 Multiple myeloma in remission: Secondary | ICD-10-CM | POA: Diagnosis not present

## 2022-01-31 DIAGNOSIS — I4891 Unspecified atrial fibrillation: Secondary | ICD-10-CM | POA: Diagnosis not present

## 2022-01-31 DIAGNOSIS — G8929 Other chronic pain: Secondary | ICD-10-CM | POA: Diagnosis not present

## 2022-01-31 DIAGNOSIS — K5903 Drug induced constipation: Secondary | ICD-10-CM | POA: Diagnosis not present

## 2022-01-31 DIAGNOSIS — D702 Other drug-induced agranulocytosis: Secondary | ICD-10-CM | POA: Diagnosis not present

## 2022-01-31 DIAGNOSIS — C9000 Multiple myeloma not having achieved remission: Secondary | ICD-10-CM | POA: Diagnosis not present

## 2022-02-07 DIAGNOSIS — K5903 Drug induced constipation: Secondary | ICD-10-CM | POA: Diagnosis not present

## 2022-02-07 DIAGNOSIS — I4891 Unspecified atrial fibrillation: Secondary | ICD-10-CM | POA: Diagnosis not present

## 2022-02-07 DIAGNOSIS — R202 Paresthesia of skin: Secondary | ICD-10-CM | POA: Diagnosis not present

## 2022-02-07 DIAGNOSIS — C9001 Multiple myeloma in remission: Secondary | ICD-10-CM | POA: Diagnosis not present

## 2022-02-07 DIAGNOSIS — I10 Essential (primary) hypertension: Secondary | ICD-10-CM | POA: Diagnosis not present

## 2022-02-07 DIAGNOSIS — M25551 Pain in right hip: Secondary | ICD-10-CM | POA: Diagnosis not present

## 2022-02-07 DIAGNOSIS — Z79899 Other long term (current) drug therapy: Secondary | ICD-10-CM | POA: Diagnosis not present

## 2022-02-07 DIAGNOSIS — Z9481 Bone marrow transplant status: Secondary | ICD-10-CM | POA: Diagnosis not present

## 2022-02-07 DIAGNOSIS — R197 Diarrhea, unspecified: Secondary | ICD-10-CM | POA: Diagnosis not present

## 2022-02-07 DIAGNOSIS — Z5112 Encounter for antineoplastic immunotherapy: Secondary | ICD-10-CM | POA: Diagnosis not present

## 2022-02-07 DIAGNOSIS — K5900 Constipation, unspecified: Secondary | ICD-10-CM | POA: Diagnosis not present

## 2022-02-07 DIAGNOSIS — C9000 Multiple myeloma not having achieved remission: Secondary | ICD-10-CM | POA: Diagnosis not present

## 2022-02-07 DIAGNOSIS — D702 Other drug-induced agranulocytosis: Secondary | ICD-10-CM | POA: Diagnosis not present

## 2022-02-07 DIAGNOSIS — G8929 Other chronic pain: Secondary | ICD-10-CM | POA: Diagnosis not present

## 2022-02-07 DIAGNOSIS — Z7961 Long term (current) use of immunomodulator: Secondary | ICD-10-CM | POA: Diagnosis not present

## 2022-02-23 DIAGNOSIS — R3914 Feeling of incomplete bladder emptying: Secondary | ICD-10-CM | POA: Diagnosis not present

## 2022-02-28 ENCOUNTER — Other Ambulatory Visit: Payer: Self-pay

## 2022-02-28 MED ORDER — TERAZOSIN HCL 5 MG PO CAPS
ORAL_CAPSULE | ORAL | 3 refills | Status: DC
Start: 2022-02-28 — End: 2022-04-13

## 2022-03-03 ENCOUNTER — Ambulatory Visit: Payer: Medicare Other

## 2022-03-07 ENCOUNTER — Other Ambulatory Visit: Payer: Self-pay

## 2022-03-07 DIAGNOSIS — G8929 Other chronic pain: Secondary | ICD-10-CM | POA: Diagnosis not present

## 2022-03-07 DIAGNOSIS — D702 Other drug-induced agranulocytosis: Secondary | ICD-10-CM | POA: Diagnosis not present

## 2022-03-07 DIAGNOSIS — M25551 Pain in right hip: Secondary | ICD-10-CM | POA: Diagnosis not present

## 2022-03-07 DIAGNOSIS — C9001 Multiple myeloma in remission: Secondary | ICD-10-CM | POA: Diagnosis not present

## 2022-03-07 DIAGNOSIS — Z9481 Bone marrow transplant status: Secondary | ICD-10-CM | POA: Diagnosis not present

## 2022-03-08 ENCOUNTER — Other Ambulatory Visit: Payer: Self-pay

## 2022-03-08 ENCOUNTER — Encounter: Payer: Self-pay | Admitting: Internal Medicine

## 2022-03-08 MED ORDER — TERAZOSIN HCL 10 MG PO CAPS: 10.0000 mg | ORAL_CAPSULE | Freq: Every day | ORAL | 1 refills | Status: DC

## 2022-03-09 ENCOUNTER — Other Ambulatory Visit: Payer: Self-pay

## 2022-03-09 DIAGNOSIS — I480 Paroxysmal atrial fibrillation: Secondary | ICD-10-CM

## 2022-03-09 MED ORDER — APIXABAN 5 MG PO TABS: ORAL_TABLET | ORAL | 3 refills | Status: DC

## 2022-03-14 ENCOUNTER — Other Ambulatory Visit: Payer: Self-pay | Admitting: Cardiology

## 2022-03-14 DIAGNOSIS — I480 Paroxysmal atrial fibrillation: Secondary | ICD-10-CM

## 2022-04-06 ENCOUNTER — Ambulatory Visit (INDEPENDENT_AMBULATORY_CARE_PROVIDER_SITE_OTHER): Payer: Medicare Other

## 2022-04-06 VITALS — BP 122/60 | HR 72 | Temp 98.2°F | Ht 74.0 in | Wt 226.8 lb

## 2022-04-06 DIAGNOSIS — Z0000 Encounter for general adult medical examination without abnormal findings: Secondary | ICD-10-CM

## 2022-04-06 NOTE — Patient Instructions (Signed)
Mr. Sarr , ?Thank you for taking time to come for your Medicare Wellness Visit. I appreciate your ongoing commitment to your health goals. Please review the following plan we discussed and let me know if I can assist you in the future.  ? ?Screening recommendations/referrals: ?Colonoscopy: not required ?Recommended yearly ophthalmology/optometry visit for glaucoma screening and checkup ?Recommended yearly dental visit for hygiene and checkup ? ?Vaccinations: ?Influenza vaccine: due 07/12/2022 ?Pneumococcal vaccine: completed 11/03/2021 ?Tdap vaccine: completed 12/25/2012, due 12/25/2022 ?Shingles vaccine: completed   ?Covid-19:  01/07/2022, 11/22/2021, 04/19/2021, 09/14/2020, 01/24/2020, 01/03/2020 ? ?Advanced directives: Please bring a copy of your POA (Power of Attorney) and/or Living Will to your next appointment.  ? ? ?Conditions/risks identified: none ? ?Next appointment: Follow up in one year for your annual wellness visit.  ? ?Preventive Care 32 Years and Older, Male ?Preventive care refers to lifestyle choices and visits with your health care provider that can promote health and wellness. ?What does preventive care include? ?A yearly physical exam. This is also called an annual well check. ?Dental exams once or twice a year. ?Routine eye exams. Ask your health care provider how often you should have your eyes checked. ?Personal lifestyle choices, including: ?Daily care of your teeth and gums. ?Regular physical activity. ?Eating a healthy diet. ?Avoiding tobacco and drug use. ?Limiting alcohol use. ?Practicing safe sex. ?Taking low doses of aspirin every day. ?Taking vitamin and mineral supplements as recommended by your health care provider. ?What happens during an annual well check? ?The services and screenings done by your health care provider during your annual well check will depend on your age, overall health, lifestyle risk factors, and family history of disease. ?Counseling  ?Your health care provider may ask  you questions about your: ?Alcohol use. ?Tobacco use. ?Drug use. ?Emotional well-being. ?Home and relationship well-being. ?Sexual activity. ?Eating habits. ?History of falls. ?Memory and ability to understand (cognition). ?Work and work Statistician. ?Screening  ?You may have the following tests or measurements: ?Height, weight, and BMI. ?Blood pressure. ?Lipid and cholesterol levels. These may be checked every 5 years, or more frequently if you are over 84 years old. ?Skin check. ?Lung cancer screening. You may have this screening every year starting at age 40 if you have a 30-pack-year history of smoking and currently smoke or have quit within the past 15 years. ?Fecal occult blood test (FOBT) of the stool. You may have this test every year starting at age 35. ?Flexible sigmoidoscopy or colonoscopy. You may have a sigmoidoscopy every 5 years or a colonoscopy every 10 years starting at age 87. ?Prostate cancer screening. Recommendations will vary depending on your family history and other risks. ?Hepatitis C blood test. ?Hepatitis B blood test. ?Sexually transmitted disease (STD) testing. ?Diabetes screening. This is done by checking your blood sugar (glucose) after you have not eaten for a while (fasting). You may have this done every 1-3 years. ?Abdominal aortic aneurysm (AAA) screening. You may need this if you are a current or former smoker. ?Osteoporosis. You may be screened starting at age 80 if you are at high risk. ?Talk with your health care provider about your test results, treatment options, and if necessary, the need for more tests. ?Vaccines  ?Your health care provider may recommend certain vaccines, such as: ?Influenza vaccine. This is recommended every year. ?Tetanus, diphtheria, and acellular pertussis (Tdap, Td) vaccine. You may need a Td booster every 10 years. ?Zoster vaccine. You may need this after age 11. ?Pneumococcal 13-valent conjugate (PCV13) vaccine.  One dose is recommended after age  35. ?Pneumococcal polysaccharide (PPSV23) vaccine. One dose is recommended after age 26. ?Talk to your health care provider about which screenings and vaccines you need and how often you need them. ?This information is not intended to replace advice given to you by your health care provider. Make sure you discuss any questions you have with your health care provider. ?Document Released: 12/25/2015 Document Revised: 08/17/2016 Document Reviewed: 09/29/2015 ?Elsevier Interactive Patient Education ? 2017 [employer]. ? ?Fall Prevention in the Home ?Falls can cause injuries. They can happen to people of all ages. There are many things you can do to make your home safe and to help prevent falls. ?What can I do on the outside of my home? ?Regularly fix the edges of walkways and driveways and fix any cracks. ?Remove anything that might make you trip as you walk through a door, such as a raised step or threshold. ?Trim any bushes or trees on the path to your home. ?Use bright outdoor lighting. ?Clear any walking paths of anything that might make someone trip, such as rocks or tools. ?Regularly check to see if handrails are loose or broken. Make sure that both sides of any steps have handrails. ?Any raised decks and porches should have guardrails on the edges. ?Have any leaves, snow, or ice cleared regularly. ?Use sand or salt on walking paths during winter. ?Clean up any spills in your garage right away. This includes oil or grease spills. ?What can I do in the bathroom? ?Use night lights. ?Install grab bars by the toilet and in the tub and shower. Do not use towel bars as grab bars. ?Use non-skid mats or decals in the tub or shower. ?If you need to sit down in the shower, use a plastic, non-slip stool. ?Keep the floor dry. Clean up any water that spills on the floor as soon as it happens. ?Remove soap buildup in the tub or shower regularly. ?Attach bath mats securely with double-sided non-slip rug tape. ?Do not have throw  rugs and other things on the floor that can make you trip. ?What can I do in the bedroom? ?Use night lights. ?Make sure that you have a light by your bed that is easy to reach. ?Do not use any sheets or blankets that are too big for your bed. They should not hang down onto the floor. ?Have a firm chair that has side arms. You can use this for support while you get dressed. ?Do not have throw rugs and other things on the floor that can make you trip. ?What can I do in the kitchen? ?Clean up any spills right away. ?Avoid walking on wet floors. ?Keep items that you use a lot in easy-to-reach places. ?If you need to reach something above you, use a strong step stool that has a grab bar. ?Keep electrical cords out of the way. ?Do not use floor polish or wax that makes floors slippery. If you must use wax, use non-skid floor wax. ?Do not have throw rugs and other things on the floor that can make you trip. ?What can I do with my stairs? ?Do not leave any items on the stairs. ?Make sure that there are handrails on both sides of the stairs and use them. Fix handrails that are broken or loose. Make sure that handrails are as long as the stairways. ?Check any carpeting to make sure that it is firmly attached to the stairs. Fix any carpet that is loose or  worn. ?Avoid having throw rugs at the top or bottom of the stairs. If you do have throw rugs, attach them to the floor with carpet tape. ?Make sure that you have a light switch at the top of the stairs and the bottom of the stairs. If you do not have them, ask someone to add them for you. ?What else can I do to help prevent falls? ?Wear shoes that: ?Do not have high heels. ?Have rubber bottoms. ?Are comfortable and fit you well. ?Are closed at the toe. Do not wear sandals. ?If you use a stepladder: ?Make sure that it is fully opened. Do not climb a closed stepladder. ?Make sure that both sides of the stepladder are locked into place. ?Ask someone to hold it for you, if  possible. ?Clearly mark and make sure that you can see: ?Any grab bars or handrails. ?First and last steps. ?Where the edge of each step is. ?Use tools that help you move around (mobility aids) if they are nee

## 2022-04-06 NOTE — Progress Notes (Signed)
?This visit occurred during the SARS-CoV-2 public health emergency.  Safety protocols were in place, including screening questions prior to the visit, additional usage of staff PPE, and extensive cleaning of exam room while observing appropriate contact time as indicated for disinfecting solutions. ? ?Subjective:  ? Edward Gallagher is a 81 y.o. male who presents for Medicare Annual/Subsequent preventive examination. ? ?Review of Systems    ? ?Cardiac Risk Factors include: advanced age (>44mn, >>44women);hypertension;male gender ? ?   ?Objective:  ?  ?Today's Vitals  ? 04/06/22 1458  ?BP: 122/60  ?Pulse: 72  ?Temp: 98.2 ?F (36.8 ?C)  ?TempSrc: Oral  ?SpO2: 97%  ?Weight: 226 lb 12.8 oz (102.9 kg)  ?Height: '6\' 2"'  (1.88 m)  ? ?Body mass index is 29.12 kg/m?. ? ? ?  04/06/2022  ?  3:07 PM 05/29/2021  ? 10:33 AM 03/31/2021  ? 10:10 AM 03/05/2021  ?  7:49 AM 03/25/2020  ? 10:34 AM 12/16/2019  ? 12:34 PM 05/08/2019  ?  2:57 PM  ?Advanced Directives  ?Does Patient Have a Medical Advance Directive? Yes No No No No No No  ?Type of [place] will        ?Copy of [place] Chart? No - copy requested        ?Would patient like information on creating a medical advance directive?  No - Patient declined No - Patient declined No - Patient declined No - Patient declined  Yes (MAU/Ambulatory/Procedural Areas - Information given)  ? ? ?Current Medications (verified) ?Outpatient Encounter Medications as of 04/06/2022  ?Medication Sig  ? acyclovir (ZOVIRAX) 400 MG tablet Take 400 mg by mouth 2 (two) times daily.  ? ELIQUIS 5 MG TABS tablet TAKE 1 TABLET(5 MG) BY MOUTH TWICE DAILY  ? finasteride (PROSCAR) 5 MG tablet Take 5 mg by mouth daily.  ? fluticasone (FLONASE) 50 MCG/ACT nasal spray SHAKE LIQUID AND USE 1 SPRAY IN EACH NOSTRIL DAILY  ? hydrochlorothiazide (MICROZIDE) 12.5 MG capsule TAKE 1 CAPSULE BY MOUTH  DAILY  ? terazosin (HYTRIN) 10 MG capsule Take 1 capsule (10 mg  total) by mouth at bedtime.  ? nitroGLYCERIN (NITROSTAT) 0.4 MG SL tablet Place 1 tablet (0.4 mg total) under the tongue every 5 (five) minutes as needed for chest pain.  ? terazosin (HYTRIN) 5 MG capsule TAKE 1 CAPSULE BY MOUTH  DAILY AT BEDTIME (Patient not taking: Reported on 04/06/2022)  ? ?No facility-administered encounter medications on file as of 04/06/2022.  ? ? ?Allergies (verified) ?Patient has no known allergies.  ? ?History: ?Past Medical History:  ?Diagnosis Date  ? A-fib ([place]   ? Allergy   ? Hyperlipidemia   ? Hypertension   ? Multiple myeloma ([facility]   ? Prostatic hypertrophy   ? Sinus trouble   ? Sleep apnea   ? ?Past Surgical History:  ?Procedure Laterality Date  ? HERNIA REPAIR    ? VASECTOMY    ? ?Family History  ?Problem Relation Age of Onset  ? Healthy Mother   ? Prostate cancer Father   ? Testicular cancer Brother   ? Prostate cancer Brother   ? Lung cancer Brother   ? Cancer Brother   ?     double mynoma  ? Hypertension Other   ? ?Social History  ? ?Socioeconomic History  ? Marital status: Married  ?  Spouse name: Not on file  ? Number of children: 0  ? Years of education: Not on  file  ? Highest education level: Not on file  ?Occupational History  ? Occupation: retired  ?Tobacco Use  ? Smoking status: Former  ?  Packs/day: 0.50  ?  Years: 35.00  ?  Pack years: 17.50  ?  Types: Cigarettes  ?  Quit date: 12/12/1997  ?  Years since quitting: 24.3  ? Smokeless tobacco: Never  ? Tobacco comments:  ?  12 cigs daily  ?Vaping Use  ? Vaping Use: Never used  ?Substance and Sexual Activity  ? Alcohol use: Yes  ?  Comment: ocasional beer  ? Drug use: No  ? Sexual activity: Not Currently  ?Other Topics Concern  ? Not on file  ?Social History Narrative  ? Not on file  ? ?Social Determinants of Health  ? ?Financial Resource Strain: Low Risk   ? Difficulty of Paying Living Expenses: Not hard at all  ?Food Insecurity: No Food Insecurity  ? Worried About Charity fundraiser in the Last Year: Never true  ? Ran Out  of Food in the Last Year: Never true  ?Transportation Needs: No Transportation Needs  ? Lack of Transportation (Medical): No  ? Lack of Transportation (Non-Medical): No  ?Physical Activity: Inactive  ? Days of Exercise per Week: 0 days  ? Minutes of Exercise per Session: 0 min  ?Stress: No Stress Concern Present  ? Feeling of Stress : Not at all  ?Social Connections: Not on file  ? ? ?Tobacco Counseling ?Counseling given: Not Answered ?Tobacco comments: 12 cigs daily ? ? ?Clinical Intake: ? ?Pre-visit preparation completed: Yes ? ?Pain : No/denies pain ? ?  ? ?Nutritional Status: BMI 25 -29 Overweight ?Nutritional Risks: None ?Diabetes: No ? ?How often do you need to have someone help you when you read instructions, pamphlets, or other written materials from your doctor or pharmacy?: 1 - Never ? ?Diabetic? no ? ?Interpreter Needed?: No ? ?Information entered by :: NAllen LPN ? ? ?Activities of Daily Living ? ?  04/06/2022  ?  3:08 PM  ?In your present state of health, do you have any difficulty performing the following activities:  ?Hearing? 0  ?Vision? 0  ?Difficulty concentrating or making decisions? 0  ?Walking or climbing stairs? 0  ?Dressing or bathing? 0  ?Doing errands, shopping? 0  ?Preparing Food and eating ? N  ?Using the Toilet? N  ?In the past six months, have you accidently leaked urine? N  ?Do you have problems with loss of bowel control? N  ?Managing your Medications? N  ?Managing your Finances? N  ? ? ?Patient Care Team: ?Glendale Chard, MD as PCP - General (Internal Medicine) ? ?Indicate any recent Medical Services you may have received from other than [facility] providers in the past year (date may be approximate). ? ?   ?Assessment:  ? This is a routine wellness examination for Edward Gallagher. ? ?Hearing/Vision screen ?Vision Screening - Comments:: Regular eye exams, [facility]  ? ?Dietary issues and exercise activities discussed: ?Current Exercise Habits: The patient does not participate in regular exercise  at present ? ? Goals Addressed   ? ?  ?  ?  ?  ? This Visit's Progress  ?  Patient Stated     ?  04/06/2022, no goals ?  ? ?  ? ?Depression Screen ? ?  04/06/2022  ?  3:08 PM 03/31/2021  ? 10:11 AM 03/31/2021  ?  9:33 AM 03/25/2020  ? 10:35 AM 09/25/2019  ?  9:16 AM 07/25/2019  ?  2:41 PM 05/08/2019  ?  2:58 PM  ?PHQ 2/9 Scores  ?PHQ - 2 Score 0 0 0 0 0 0 0  ?PHQ- 9 Score       0  ?  ?Fall Risk ? ?  04/06/2022  ?  3:08 PM 03/31/2021  ? 10:10 AM 03/31/2021  ?  9:33 AM 03/25/2020  ? 10:35 AM 09/25/2019  ?  9:16 AM  ?Fall Risk   ?Falls in the past year? 0 0 0 0 0  ?Number falls in past yr: 0      ?Injury with Fall? 0      ?Risk for fall due to : Medication side effect Medication side effect  Medication side effect   ?Follow up Falls evaluation completed;Education provided;Falls prevention discussed Falls evaluation completed;Education provided;Falls prevention discussed  Education provided;Falls prevention discussed;Falls evaluation completed   ? ? ?FALL RISK PREVENTION PERTAINING TO THE HOME: ? ?Any stairs in or around the home? No  ?If so, are there any without handrails?  N/a ?Home free of loose throw rugs in walkways, pet beds, electrical cords, etc? Yes  ?Adequate lighting in your home to reduce risk of falls? Yes  ? ?ASSISTIVE DEVICES UTILIZED TO PREVENT FALLS: ? ?Life alert? No  ?Use of a cane, walker or w/c? No  ?Grab bars in the bathroom? Yes  ?Shower chair or bench in shower? Yes  ?Elevated toilet seat or a handicapped toilet? No  ? ?TIMED UP AND GO: ? ?Was the test performed? No .  ? ? ?Gait steady and fast without use of assistive device ? ?Cognitive Function: ?  ?  ? ?  04/06/2022  ?  3:11 PM 03/31/2021  ? 10:11 AM 03/25/2020  ? 10:36 AM 05/08/2019  ?  3:00 PM  ?6CIT Screen  ?What Year? 0 points 0 points 0 points 0 points  ?What month? 0 points 0 points 0 points 0 points  ?What time? 0 points 0 points 0 points 0 points  ?Count back from 20 0 points 0 points 4 points 0 points  ?Months in reverse 0 points 0 points 0 points  0 points  ?Repeat phrase 0 points 2 points 0 points 0 points  ?Total Score 0 points 2 points 4 points 0 points  ? ? ?Immunizations ?Immunization History  ?Administered Date(s) Administered  ? Fluad Quad(hi

## 2022-04-07 ENCOUNTER — Ambulatory Visit: Payer: Medicare Other | Admitting: Internal Medicine

## 2022-04-07 ENCOUNTER — Ambulatory Visit: Payer: Medicare Other

## 2022-04-13 ENCOUNTER — Encounter: Payer: Self-pay | Admitting: Internal Medicine

## 2022-04-13 ENCOUNTER — Ambulatory Visit (INDEPENDENT_AMBULATORY_CARE_PROVIDER_SITE_OTHER): Payer: Medicare Other | Admitting: Internal Medicine

## 2022-04-13 VITALS — BP 118/70 | HR 65 | Temp 97.9°F | Ht 74.0 in | Wt 224.6 lb

## 2022-04-13 DIAGNOSIS — I480 Paroxysmal atrial fibrillation: Secondary | ICD-10-CM

## 2022-04-13 DIAGNOSIS — I129 Hypertensive chronic kidney disease with stage 1 through stage 4 chronic kidney disease, or unspecified chronic kidney disease: Secondary | ICD-10-CM | POA: Diagnosis not present

## 2022-04-13 DIAGNOSIS — N62 Hypertrophy of breast: Secondary | ICD-10-CM

## 2022-04-13 DIAGNOSIS — D6869 Other thrombophilia: Secondary | ICD-10-CM

## 2022-04-13 DIAGNOSIS — N182 Chronic kidney disease, stage 2 (mild): Secondary | ICD-10-CM | POA: Diagnosis not present

## 2022-04-13 DIAGNOSIS — I77811 Abdominal aortic ectasia: Secondary | ICD-10-CM | POA: Diagnosis not present

## 2022-04-13 DIAGNOSIS — M7661 Achilles tendinitis, right leg: Secondary | ICD-10-CM

## 2022-04-13 DIAGNOSIS — Z6828 Body mass index (BMI) 28.0-28.9, adult: Secondary | ICD-10-CM

## 2022-04-13 MED ORDER — TRAMADOL HCL 50 MG PO TABS: 50.0000 mg | ORAL_TABLET | Freq: Two times a day (BID) | ORAL | 0 refills | Status: AC | PRN

## 2022-04-13 NOTE — Patient Instructions (Addendum)
Voltaren gel - apply to affected area two to three times daily as needed ? ?Hypertension, Adult ?Hypertension is another name for high blood pressure. High blood pressure forces your heart to work harder to pump blood. This can cause problems over time. ?There are two numbers in a blood pressure reading. There is a top number (systolic) over a bottom number (diastolic). It is best to have a blood pressure that is below 120/80. ?What are the causes? ?The cause of this condition is not known. Some other conditions can lead to high blood pressure. ?What increases the risk? ?Some lifestyle factors can make you more likely to develop high blood pressure: ?Smoking. ?Not getting enough exercise or physical activity. ?Being overweight. ?Having too much fat, sugar, calories, or salt (sodium) in your diet. ?Drinking too much alcohol. ?Other risk factors include: ?Having any of these conditions: ?Heart disease. ?Diabetes. ?High cholesterol. ?Kidney disease. ?Obstructive sleep apnea. ?Having a family history of high blood pressure and high cholesterol. ?Age. The risk increases with age. ?Stress. ?What are the signs or symptoms? ?High blood pressure may not cause symptoms. Very high blood pressure (hypertensive crisis) may cause: ?Headache. ?Fast or uneven heartbeats (palpitations). ?Shortness of breath. ?Nosebleed. ?Vomiting or feeling like you may vomit (nauseous). ?Changes in how you see. ?Very bad chest pain. ?Feeling dizzy. ?Seizures. ?How is this treated? ?This condition is treated by making healthy lifestyle changes, such as: ?Eating healthy foods. ?Exercising more. ?Drinking less alcohol. ?Your doctor may prescribe medicine if lifestyle changes do not help enough and if: ?Your top number is above 130. ?Your bottom number is above 80. ?Your personal target blood pressure may vary. ?Follow these instructions at home: ?Eating and drinking ? ?If told, follow the DASH eating plan. To follow this plan: ?Fill one half of your  plate at each meal with fruits and vegetables. ?Fill one fourth of your plate at each meal with whole grains. Whole grains include whole-wheat pasta, brown rice, and whole-grain bread. ?Eat or drink low-fat dairy products, such as skim milk or low-fat yogurt. ?Fill one fourth of your plate at each meal with low-fat (lean) proteins. Low-fat proteins include fish, chicken without skin, eggs, beans, and tofu. ?Avoid fatty meat, cured and processed meat, or chicken with skin. ?Avoid pre-made or processed food. ?Limit the amount of salt in your diet to less than 1,500 mg each day. ?Do not drink alcohol if: ?Your doctor tells you not to drink. ?You are pregnant, may be pregnant, or are planning to become pregnant. ?If you drink alcohol: ?Limit how much you have to: ?0-1 drink a day for women. ?0-2 drinks a day for men. ?Know how much alcohol is in your drink. In the [place] one drink equals one 12 oz bottle of beer (355 mL), one 5 oz glass of wine (148 mL), or one 1? oz glass of hard liquor (44 mL). ?Lifestyle ? ?Work with your doctor to stay at a healthy weight or to lose weight. Ask your doctor what the best weight is for you. ?Get at least 30 minutes of exercise that causes your heart to beat faster (aerobic exercise) most days of the week. This may include walking, swimming, or biking. ?Get at least 30 minutes of exercise that strengthens your muscles (resistance exercise) at least 3 days a week. This may include lifting weights or doing Pilates. ?Do not smoke or use any products that contain nicotine or tobacco. If you need help quitting, ask your doctor. ?Check your blood pressure at home  as told by your doctor. ?Keep all follow-up visits. ?Medicines ?Take over-the-counter and prescription medicines only as told by your doctor. Follow directions carefully. ?Do not skip doses of blood pressure medicine. The medicine does not work as well if you skip doses. Skipping doses also puts you at risk for problems. ?Ask your  doctor about side effects or reactions to medicines that you should watch for. ?Contact a doctor if: ?You think you are having a reaction to the medicine you are taking. ?You have headaches that keep coming back. ?You feel dizzy. ?You have swelling in your ankles. ?You have trouble with your vision. ?Get help right away if: ?You get a very bad headache. ?You start to feel mixed up (confused). ?You feel weak or numb. ?You feel faint. ?You have very bad pain in your: ?Chest. ?Belly (abdomen). ?You vomit more than once. ?You have trouble breathing. ?These symptoms may be an emergency. Get help right away. Call 911. ?Do not wait to see if the symptoms will go away. ?Do not drive yourself to the hospital. ?Summary ?Hypertension is another name for high blood pressure. ?High blood pressure forces your heart to work harder to pump blood. ?For most people, a normal blood pressure is less than 120/80. ?Making healthy choices can help lower blood pressure. If your blood pressure does not get lower with healthy choices, you may need to take medicine. ?This information is not intended to replace advice given to you by your health care provider. Make sure you discuss any questions you have with your health care provider. ?Document Revised: 09/16/2021 Document Reviewed: 09/16/2021 ?Elsevier Patient Education ? [employer]. ? ?

## 2022-04-13 NOTE — Progress Notes (Signed)
Rich Brave [place]leton,acting as a Education administrator for Maximino Greenland, MD.,have documented all relevant documentation on the behalf of Maximino Greenland, MD,as directed by  Maximino Greenland, MD while in the presence of Maximino Greenland, MD.  This visit occurred during the SARS-CoV-2 public health emergency.  Safety protocols were in place, including screening questions prior to the visit, additional usage of staff PPE, and extensive cleaning of exam room while observing appropriate contact time as indicated for disinfecting solutions.  Subjective:     Patient ID: Edward Gallagher , male    DOB: 10/15/1941 , 81 y.o.   MRN: [id number]   Chief Complaint  Patient presents with   Hypertension    HPI  He presents today for a blood pressure check. He reports compliance with meds. He is currently followed at [facility] for treatment of MM. He is currently on Revlimid and daratumumab therapy.   Hypertension This is a chronic problem. The current episode started more than 1 year ago. The problem has been gradually improving since onset. The problem is controlled. Pertinent negatives include no blurred vision, chest pain, palpitations or shortness of breath. Risk factors for coronary artery disease include male gender and obesity.     Past Medical History:  Diagnosis Date   A-fib ([place])    Allergy    Hyperlipidemia    Hypertension    Multiple myeloma (HCC)    Prostatic hypertrophy    Sinus trouble    Sleep apnea      Family History  Problem Relation Age of Onset   Healthy Mother    Prostate cancer Father    Testicular cancer Brother    Prostate cancer Brother    Lung cancer Brother    Cancer Brother        double mynoma   Hypertension Other      Current Outpatient Medications:    acyclovir (ZOVIRAX) 400 MG tablet, Take 400 mg by mouth 2 (two) times daily., Disp: , Rfl:    ELIQUIS 5 MG TABS tablet, TAKE 1 TABLET(5 MG) BY MOUTH TWICE DAILY, Disp: 180 tablet, Rfl: 3   finasteride (PROSCAR) 5 MG tablet,  Take 5 mg by mouth daily., Disp: , Rfl:    fluticasone (FLONASE) 50 MCG/ACT nasal spray, SHAKE LIQUID AND USE 1 SPRAY IN EACH NOSTRIL DAILY, Disp: 16 g, Rfl: 2   hydrochlorothiazide (MICROZIDE) 12.5 MG capsule, TAKE 1 CAPSULE BY MOUTH  DAILY, Disp: 90 capsule, Rfl: 3   nitroGLYCERIN (NITROSTAT) 0.4 MG SL tablet, Place 1 tablet (0.4 mg total) under the tongue every 5 (five) minutes as needed for chest pain., Disp: 100 tablet, Rfl: 3   terazosin (HYTRIN) 10 MG capsule, Take 1 capsule (10 mg total) by mouth at bedtime., Disp: 90 capsule, Rfl: 1   traMADol (ULTRAM) 50 MG tablet, Take 1 tablet (50 mg total) by mouth every 12 (twelve) hours as needed., Disp: 30 tablet, Rfl: 0   No Known Allergies   Review of Systems  Constitutional: Negative.   Eyes:  Negative for blurred vision.  Respiratory: Negative.  Negative for shortness of breath.   Cardiovascular: Negative.  Negative for chest pain and palpitations.  Gastrointestinal: Negative.   Genitourinary:        He c/o hardened breast tissue. First noticed about six weeks ago. Denies being on any new medication.  They were initially sore, but this has resolved. Denies marijuana use. Denies nipple discharge. He discussed w/ oncologist, advised to see PCP. He adds that he  does feel left breast has increased in size.   Musculoskeletal:        He c/o r achilles pain. States this started 2 weeks ago. Denies fall/trauma. He admits to walking more frequently in recent weeks. Thinks he may have stretched it too much.   Neurological: Negative.   Psychiatric/Behavioral: Negative.       Today's Vitals   04/13/22 0900  BP: 118/70  Pulse: 65  Temp: 97.9 F (36.6 C)  Weight: 224 lb 9.6 oz (101.9 kg)  Height: 6' 2" (1.88 m)  PainSc: 3   PainLoc: Hip   Body mass index is 28.84 kg/m.  Wt Readings from Last 3 Encounters:  05/16/22 225 lb (102.1 kg)  04/13/22 224 lb 9.6 oz (101.9 kg)  04/06/22 226 lb 12.8 oz (102.9 kg)     Objective:  Physical  Exam Vitals and nursing note reviewed.  Constitutional:      Appearance: Normal appearance.  HENT:     Head: Normocephalic and atraumatic.  Eyes:     Extraocular Movements: Extraocular movements intact.  Cardiovascular:     Rate and Rhythm: Normal rate and regular rhythm.     Heart sounds: Normal heart sounds.  Pulmonary:     Effort: Pulmonary effort is normal.     Breath sounds: Normal breath sounds.  Chest:     Comments: There is slight tenderness of palpation of left breast. No discrete nodules palpated Musculoskeletal:     Cervical back: Normal range of motion.  Skin:    General: Skin is warm.  Neurological:     General: No focal deficit present.     Mental Status: He is alert.  Psychiatric:        Mood and Affect: Mood normal.      Assessment And Plan:     1. Hypertensive nephropathy Comments: Chronic, well controlled. He will c/w current meds. Recent labs reviewed in [place].   2. Chronic renal disease, stage II Comments: GFR seems to fluctuate between 50s-60s. LIkely related to volume status. No need to repeat Renal labs today. He has regular labwork at [facility], this was reviewed.  3. Gynecomastia Comments: I will check labs as below. Perhaps testosterone levels suppressed w/ chemo.  - Testosterone,Free and Total - LH - Estradiol - TSH - HCG, Tumor Marker  4. Achilles tendinitis of right lower extremity Comments: He was given some stretches to perform, may apply topical pain cream prn. He is encouraged to let me know if his sx persist.   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   6. Paroxysmal atrial fibrillation (HCC) Comments: He is currently in sinus rhythm. He is also followed by Cardiology. He is properly anticoagulated.  7. Acquired thrombophilia ([place]) Comments: Currently on Eliquis due to PAF.   8. BMI 28.0-28.9,adult Comments: He is encouraged to aim for at least 150 minutes of exercise per week. He is an  Probation officer.    Patient was given opportunity to ask questions. Patient verbalized understanding of the plan and was able to repeat key elements of the plan. All questions were answered to their satisfaction.   I, Maximino Greenland, MD, have reviewed all documentation for this visit. The documentation on 05/21/22 for the exam, diagnosis, procedures, and orders are all accurate and complete.   IF YOU HAVE BEEN REFERRED TO A SPECIALIST, IT MAY TAKE 1-2 WEEKS TO SCHEDULE/PROCESS THE REFERRAL. IF YOU HAVE NOT HEARD FROM US/SPECIALIST IN TWO WEEKS, PLEASE GIVE [place]  A CALL AT [phone].   THE PATIENT IS ENCOURAGED TO PRACTICE SOCIAL DISTANCING DUE TO THE COVID-19 PANDEMIC.

## 2022-04-14 LAB — TESTOSTERONE,FREE AND TOTAL
Testosterone, Free: 2.3 pg/mL — ABNORMAL LOW (ref 6.6–18.1)
Testosterone: 238 ng/dL — ABNORMAL LOW (ref 264–916)

## 2022-04-14 LAB — ESTRADIOL: Estradiol: 25.2 pg/mL (ref 7.6–42.6)

## 2022-04-14 LAB — LUTEINIZING HORMONE: LH: 14.6 m[IU]/mL — ABNORMAL HIGH (ref 1.7–8.6)

## 2022-04-14 LAB — TSH: TSH: 1.17 u[IU]/mL (ref 0.450–4.500)

## 2022-04-14 LAB — BETA HCG QUANT (REF LAB): hCG Quant: <1 m[IU]/mL (ref 0–3)

## 2022-04-18 ENCOUNTER — Encounter: Payer: Self-pay | Admitting: Internal Medicine

## 2022-04-18 ENCOUNTER — Ambulatory Visit: Payer: Medicare Other | Admitting: Internal Medicine

## 2022-05-16 ENCOUNTER — Ambulatory Visit: Payer: Medicare Other | Admitting: Cardiology

## 2022-05-16 ENCOUNTER — Encounter: Payer: Self-pay | Admitting: Cardiology

## 2022-05-16 VITALS — BP 125/66 | HR 83 | Temp 98.7°F | Resp 16 | Ht 74.0 in | Wt 225.0 lb

## 2022-05-16 DIAGNOSIS — I10 Essential (primary) hypertension: Secondary | ICD-10-CM

## 2022-05-16 DIAGNOSIS — R931 Abnormal findings on diagnostic imaging of heart and coronary circulation: Secondary | ICD-10-CM

## 2022-05-16 DIAGNOSIS — I480 Paroxysmal atrial fibrillation: Secondary | ICD-10-CM

## 2022-05-16 MED ORDER — ENTRESTO 24-26 MG PO TABS: 1.0000 | ORAL_TABLET | Freq: Two times a day (BID) | ORAL | 0 refills | Status: DC

## 2022-05-16 NOTE — Progress Notes (Signed)
Follow up visit  Subjective:   Edward Gallagher, male    DOB: February 27, 1941, 81 y.o.   MRN: [id number]   HPI  81 y.o. African American male with hypertension, hyperlipidemia, OSA, paroxysmal Afib, multiple myeloma in remission  Patient is doing well.  He has no complaints today. Labs in 11/2020 showed LDL of 170. He was reportedly taken off atorvastatin by his oncologists due to interaction with cancer therapy.     Current Outpatient Medications:    acyclovir (ZOVIRAX) 400 MG tablet, Take 400 mg by mouth 2 (two) times daily., Disp: , Rfl:    ELIQUIS 5 MG TABS tablet, TAKE 1 TABLET(5 MG) BY MOUTH TWICE DAILY, Disp: 180 tablet, Rfl: 3   finasteride (PROSCAR) 5 MG tablet, Take 5 mg by mouth daily., Disp: , Rfl:    fluticasone (FLONASE) 50 MCG/ACT nasal spray, SHAKE LIQUID AND USE 1 SPRAY IN EACH NOSTRIL DAILY, Disp: 16 g, Rfl: 2   hydrochlorothiazide (MICROZIDE) 12.5 MG capsule, TAKE 1 CAPSULE BY MOUTH  DAILY, Disp: 90 capsule, Rfl: 3   nitroGLYCERIN (NITROSTAT) 0.4 MG SL tablet, Place 1 tablet (0.4 mg total) under the tongue every 5 (five) minutes as needed for chest pain., Disp: 100 tablet, Rfl: 3   terazosin (HYTRIN) 10 MG capsule, Take 1 capsule (10 mg total) by mouth at bedtime., Disp: 90 capsule, Rfl: 1   traMADol (ULTRAM) 50 MG tablet, Take 1 tablet (50 mg total) by mouth every 12 (twelve) hours as needed., Disp: 30 tablet, Rfl: 0  Cardiovascular & other pertient studies:  EKG 05/16/2022: Sinus rhythm 55 bpm Normal EKG  Echocardiogram 05/30/2021:  1. Left ventricular ejection fraction, by estimation, is 55 to 60%. The  left ventricle has normal function. The left ventricle has no regional  wall motion abnormalities. Left ventricular diastolic parameters were  normal.   2. Right ventricular systolic function is normal. The right ventricular  size is normal.   3. The mitral valve is normal in structure. No evidence of mitral valve  regurgitation.   4. The aortic valve is normal in  structure. Aortic valve regurgitation is  not visualized.   EKG 05/21/2021: Sinus rhythm 58 bpm Normal EKG  Calcium score 01/2020: - Total Calcium Score: 38. -- Left Main: 29. -- Right Coronary: 0 -- Left Anterior Descending: 9. -- Circumflex: 0.  Lexiscan (Walking with mod Bruce)Tetrofosmin Stress Test  12/25/2019: Nondiagnostic ECG stress. Normal myocardial perfusion. Stress LV EF is mildly depressed at 42% with global hypokinesis.  No previous exam available for comparison. 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 Correlate with echocardiogram. Intermediate risk study due to low LVEF.   Abdominal Aortic Duplex  01/21/2020:  The maximum aorta (sac) diameter is 2.35 cm (prox). Diffuse calcific  plaque observed in the proximal, mid and distal aorta. Normal flow  velocities noted.  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  Normal flow velocity in the aorta and bilateral IIA.  Consider rescan in 10 years.   Carotid artery duplex  11/15/2019: Minimal stenosis in the right internal carotid artery (minimal). Minimal stenosis in the left internal carotid artery (minimal). Mild soft plaque bilateral carotid arteries. Antegrade right vertebral artery flow. Antegrade left vertebral artery flow.  Event monitor 11/02/2019 - 12/01/2019: Diagnostic time: 100%  Dominant rhythm: Sinus. HR 36-136 bpm. Avg HR 64 bpm. Episodes of atrial fibrillation with RVR noted. Afib burden <1% NSVT up to 4 beats noted.  Sinus bradycardia with lowest HR  36 bpm, typically occurs during sleep hours.  Occasional PAC/PVC seen. No atrial flutter/SVT/high grade AV block, sinus pause >3sec noted. Symptoms reported: None   Recent labs: 11/24/2021: Chol 230, TG 90, HDL 44, LDL 170  05/30/2021: Glucose 114, BUN/Cr 24/1.24. EGFR 59. Na/K 138/3.5.  H/H 7.5/24.5. MCV 80. Platelets 118 TSH 2.7 normal  02/2019: Chol 157, TG 75, HDL 52, LDL 91   Review of Systems   Cardiovascular:  Negative for chest pain, dyspnea on exertion, leg swelling (Improved with compression stockings), palpitations and syncope.        Vitals:   05/16/22 0929  BP: 125/66  Pulse: 83  Resp: 16  Temp: 98.7 F (37.1 C)  SpO2: 98%     Objective:   Physical Exam Vitals and nursing note reviewed.  Constitutional:      General: He is not in acute distress. Neck:     Vascular: No JVD.  Cardiovascular:     Rate and Rhythm: Normal rate and regular rhythm.     Heart sounds: Normal heart sounds. No murmur heard. Pulmonary:     Effort: Pulmonary effort is normal.     Breath sounds: Normal breath sounds. No wheezing or rales.  Musculoskeletal:     Right lower leg: No edema.     Left lower leg: No edema.        Assessment & Recommendations:   81 y.o. African American male with hypertension, hyperlipidemia, OSA, paroxysmal Afib, multiple myeloma in remission   Paroxysmal Afib: Afib burden <1%. Resting HR during sleep in 30s. Given symptoms are infrequent, would avoid scheduled AV nodal blocking agent or antiarrythmic therapy. No ischemia on stress test. Calcium score 29 (01/2020). Emphasized the use of CPAP.  CHA2DS2VASc score 3, annual stroke risk 3.2%. 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  No recent GI bleeding.  Tolerating apixaban.    Mixed hyperlipidemia, Elevated calcium score: LDL 170 (11/2020), up from 91 before.  Reportedly taken off atorvastatin by his oncologist. He has an appt with them on 06/02/22, I have encouraged him to discuss with them as to which of the following medications will okay form their standpoint. Atorvastatin Rosuvastatin Repatha Praluent   Hypertension:  Controlled H/o AKI with chlorthalidone, syncope with Bidil.  Aorta ectasia: Mild  F/u in 6 months   Ruthann Angulo Esther Hardy, MD [facility] Pager: [phone] Office: [phone]

## 2022-05-31 ENCOUNTER — Other Ambulatory Visit: Payer: Self-pay | Admitting: Cardiology

## 2022-05-31 ENCOUNTER — Telehealth: Payer: Self-pay

## 2022-05-31 DIAGNOSIS — E782 Mixed hyperlipidemia: Secondary | ICD-10-CM

## 2022-05-31 NOTE — Telephone Encounter (Signed)
Called and spoke to pts wife, she voiced understanding and will relay the message to the pt.

## 2022-05-31 NOTE — Telephone Encounter (Signed)
Please convey the above message to the patient, and that he may resume atorvastatin at 40 mg daily. Ordered lipid panel, to be checked in 3 months.  Thanks MJP

## 2022-05-31 NOTE — Telephone Encounter (Signed)
Joretta Bachelor, a NP with [facility] called and stated that the pt is okay to restart his cholesterol medication from an oncology standpoint. Please advise.

## 2022-06-03 ENCOUNTER — Other Ambulatory Visit: Payer: Self-pay | Admitting: Internal Medicine

## 2022-07-26 ENCOUNTER — Other Ambulatory Visit: Payer: Self-pay | Admitting: Internal Medicine

## 2022-08-17 ENCOUNTER — Encounter: Payer: Self-pay | Admitting: Internal Medicine

## 2022-08-17 ENCOUNTER — Ambulatory Visit (INDEPENDENT_AMBULATORY_CARE_PROVIDER_SITE_OTHER): Payer: Medicare Other | Admitting: Internal Medicine

## 2022-08-17 VITALS — BP 122/80 | HR 68 | Temp 97.9°F | Ht 74.0 in | Wt 227.8 lb

## 2022-08-17 DIAGNOSIS — E7800 Pure hypercholesterolemia, unspecified: Secondary | ICD-10-CM

## 2022-08-17 DIAGNOSIS — N1831 Chronic kidney disease, stage 3a: Secondary | ICD-10-CM | POA: Diagnosis not present

## 2022-08-17 DIAGNOSIS — I129 Hypertensive chronic kidney disease with stage 1 through stage 4 chronic kidney disease, or unspecified chronic kidney disease: Secondary | ICD-10-CM

## 2022-08-17 DIAGNOSIS — Z6829 Body mass index (BMI) 29.0-29.9, adult: Secondary | ICD-10-CM

## 2022-08-17 DIAGNOSIS — N182 Chronic kidney disease, stage 2 (mild): Secondary | ICD-10-CM

## 2022-08-17 MED ORDER — AMLODIPINE BESYLATE 2.5 MG PO TABS: 2.5000 mg | ORAL_TABLET | Freq: Every day | ORAL | 11 refills | Status: DC

## 2022-08-17 NOTE — Progress Notes (Signed)
Barnet Glasgow Martin,acting as a Education administrator for Maximino Greenland, MD.,have documented all relevant documentation on the behalf of Maximino Greenland, MD,as directed by  Maximino Greenland, MD while in the presence of Maximino Greenland, MD.    Subjective:     Patient ID: Edward Gallagher , male    DOB: October 09, 1941 , 81 y.o.   MRN: [id number]   Chief Complaint  Patient presents with   Hypertension    HPI  Patient presents today for a BP check, patient states compliance with medications. Patient would like to discuss starting Hctz med due to his kidney functions. He was advised to ask by his oncologist.   Hypertension This is a chronic problem. The current episode started more than 1 year ago. The problem has been gradually improving since onset. The problem is controlled. Pertinent negatives include no blurred vision, chest pain, palpitations or shortness of breath. Risk factors for coronary artery disease include male gender and obesity.     Past Medical History:  Diagnosis Date   A-fib ([place])    Allergy    Hyperlipidemia    Hypertension    Multiple myeloma (HCC)    Prostatic hypertrophy    Sinus trouble    Sleep apnea      Family History  Problem Relation Age of Onset   Healthy Mother    Prostate cancer Father    Testicular cancer Brother    Prostate cancer Brother    Lung cancer Brother    Cancer Brother        double mynoma   Hypertension Other      Current Outpatient Medications:    acyclovir (ZOVIRAX) 400 MG tablet, Take 400 mg by mouth 2 (two) times daily., Disp: , Rfl:    amLODipine (NORVASC) 2.5 MG tablet, Take 1 tablet (2.5 mg total) by mouth daily., Disp: 30 tablet, Rfl: 11   ELIQUIS 5 MG TABS tablet, TAKE 1 TABLET(5 MG) BY MOUTH TWICE DAILY, Disp: 180 tablet, Rfl: 3   finasteride (PROSCAR) 5 MG tablet, Take 5 mg by mouth daily., Disp: , Rfl:    fluticasone (FLONASE) 50 MCG/ACT nasal spray, SHAKE LIQUID AND USE 1 SPRAY IN EACH NOSTRIL DAILY, Disp: 16 g, Rfl: 2   nitroGLYCERIN  (NITROSTAT) 0.4 MG SL tablet, Place 1 tablet (0.4 mg total) under the tongue every 5 (five) minutes as needed for chest pain., Disp: 100 tablet, Rfl: 3   terazosin (HYTRIN) 10 MG capsule, Take 1 capsule (10 mg total) by mouth at bedtime., Disp: 90 capsule, Rfl: 1   traMADol (ULTRAM) 50 MG tablet, Take 1 tablet (50 mg total) by mouth every 12 (twelve) hours as needed., Disp: 30 tablet, Rfl: 0   No Known Allergies   Review of Systems  Constitutional: Negative.   HENT: Negative.    Eyes: Negative.  Negative for blurred vision.  Respiratory: Negative.  Negative for shortness of breath.   Cardiovascular: Negative.  Negative for chest pain and palpitations.  Gastrointestinal: Negative.      Today's Vitals   08/17/22 0902  BP: 122/80  Pulse: 68  Temp: 97.9 F (36.6 C)  Weight: 227 lb 12.8 oz (103.3 kg)  Height: _0  (1.88 m)  PainSc: 0-No pain   Body mass index is 29.25 kg/m.  Wt Readings from Last 3 Encounters:  08/23/22 227 lb (103 kg)  08/17/22 227 lb 12.8 oz (103.3 kg)  05/16/22 225 lb (102.1 kg)     Objective:  Physical Exam Vitals and  nursing note reviewed.  Constitutional:      Appearance: Normal appearance.  HENT:     Head: Normocephalic and atraumatic.  Eyes:     Extraocular Movements: Extraocular movements intact.  Cardiovascular:     Rate and Rhythm: Normal rate and regular rhythm.     Heart sounds: Normal heart sounds.  Pulmonary:     Effort: Pulmonary effort is normal.     Breath sounds: Normal breath sounds.  Skin:    General: Skin is warm.  Neurological:     General: No focal deficit present.     Mental Status: He is alert.  Psychiatric:        Mood and Affect: Mood normal.         Assessment And Plan:     1. Hypertensive nephropathy Comments: Chronic, well controlled. I am okay with holding diuretic due to CKD. No need to add additional meds if BP remains controlled.  - Lipid panel  2. Stage 3a chronic kidney disease ([place]) Comments: GFR 40  at [facility] yesterday, GFR 56 in August. He is encouraged to avoid NSAIDs and stay well hydrated.  3. Pure hypercholesterolemia Comments: He is not currently on statin therapy; will check lipid panel today.  He has elevated calcium score, statin therapy is needed. Held by Oncology.  - Lipid panel  4. BMI 29.0-29.9,adult Comments: His weight is stable.    Patient was given opportunity to ask questions. Patient verbalized understanding of the plan and was able to repeat key elements of the plan. All questions were answered to their satisfaction.   I, Maximino Greenland, MD, have reviewed all documentation for this visit. The documentation on 08/17/22 for the exam, diagnosis, procedures, and orders are all accurate and complete.   IF YOU HAVE BEEN REFERRED TO A SPECIALIST, IT MAY TAKE 1-2 WEEKS TO SCHEDULE/PROCESS THE REFERRAL. IF YOU HAVE NOT HEARD FROM US/SPECIALIST IN TWO WEEKS, PLEASE GIVE [place] A CALL AT [phone].   THE PATIENT IS ENCOURAGED TO PRACTICE SOCIAL DISTANCING DUE TO THE COVID-19 PANDEMIC.

## 2022-08-17 NOTE — Patient Instructions (Signed)
Hypertension, Adult ?Hypertension is another name for high blood pressure. High blood pressure forces your heart to work harder to pump blood. This can cause problems over time. ?There are two numbers in a blood pressure reading. There is a top number (systolic) over a bottom number (diastolic). It is best to have a blood pressure that is below 120/80. ?What are the causes? ?The cause of this condition is not known. Some other conditions can lead to high blood pressure. ?What increases the risk? ?Some lifestyle factors can make you more likely to develop high blood pressure: ?Smoking. ?Not getting enough exercise or physical activity. ?Being overweight. ?Having too much fat, sugar, calories, or salt (sodium) in your diet. ?Drinking too much alcohol. ?Other risk factors include: ?Having any of these conditions: ?Heart disease. ?Diabetes. ?High cholesterol. ?Kidney disease. ?Obstructive sleep apnea. ?Having a family history of high blood pressure and high cholesterol. ?Age. The risk increases with age. ?Stress. ?What are the signs or symptoms? ?High blood pressure may not cause symptoms. Very high blood pressure (hypertensive crisis) may cause: ?Headache. ?Fast or uneven heartbeats (palpitations). ?Shortness of breath. ?Nosebleed. ?Vomiting or feeling like you may vomit (nauseous). ?Changes in how you see. ?Very bad chest pain. ?Feeling dizzy. ?Seizures. ?How is this treated? ?This condition is treated by making healthy lifestyle changes, such as: ?Eating healthy foods. ?Exercising more. ?Drinking less alcohol. ?Your doctor may prescribe medicine if lifestyle changes do not help enough and if: ?Your top number is above 130. ?Your bottom number is above 80. ?Your personal target blood pressure may vary. ?Follow these instructions at home: ?Eating and drinking ? ?If told, follow the DASH eating plan. To follow this plan: ?Fill one half of your plate at each meal with fruits and vegetables. ?Fill one fourth of your plate  at each meal with whole grains. Whole grains include whole-wheat pasta, brown rice, and whole-grain bread. ?Eat or drink low-fat dairy products, such as skim milk or low-fat yogurt. ?Fill one fourth of your plate at each meal with low-fat (lean) proteins. Low-fat proteins include fish, chicken without skin, eggs, beans, and tofu. ?Avoid fatty meat, cured and processed meat, or chicken with skin. ?Avoid pre-made or processed food. ?Limit the amount of salt in your diet to less than 1,500 mg each day. ?Do not drink alcohol if: ?Your doctor tells you not to drink. ?You are pregnant, may be pregnant, or are planning to become pregnant. ?If you drink alcohol: ?Limit how much you have to: ?0-1 drink a day for women. ?0-2 drinks a day for men. ?Know how much alcohol is in your drink. In the [place] one drink equals one 12 oz bottle of beer (355 mL), one 5 oz glass of wine (148 mL), or one 1? oz glass of hard liquor (44 mL). ?Lifestyle ? ?Work with your doctor to stay at a healthy weight or to lose weight. Ask your doctor what the best weight is for you. ?Get at least 30 minutes of exercise that causes your heart to beat faster (aerobic exercise) most days of the week. This may include walking, swimming, or biking. ?Get at least 30 minutes of exercise that strengthens your muscles (resistance exercise) at least 3 days a week. This may include lifting weights or doing Pilates. ?Do not smoke or use any products that contain nicotine or tobacco. If you need help quitting, ask your doctor. ?Check your blood pressure at home as told by your doctor. ?Keep all follow-up visits. ?Medicines ?Take over-the-counter and prescription medicines   only as told by your doctor. Follow directions carefully. ?Do not skip doses of blood pressure medicine. The medicine does not work as well if you skip doses. Skipping doses also puts you at risk for problems. ?Ask your doctor about side effects or reactions to medicines that you should watch  for. ?Contact a doctor if: ?You think you are having a reaction to the medicine you are taking. ?You have headaches that keep coming back. ?You feel dizzy. ?You have swelling in your ankles. ?You have trouble with your vision. ?Get help right away if: ?You get a very bad headache. ?You start to feel mixed up (confused). ?You feel weak or numb. ?You feel faint. ?You have very bad pain in your: ?Chest. ?Belly (abdomen). ?You vomit more than once. ?You have trouble breathing. ?These symptoms may be an emergency. Get help right away. Call 911. ?Do not wait to see if the symptoms will go away. ?Do not drive yourself to the hospital. ?Summary ?Hypertension is another name for high blood pressure. ?High blood pressure forces your heart to work harder to pump blood. ?For most people, a normal blood pressure is less than 120/80. ?Making healthy choices can help lower blood pressure. If your blood pressure does not get lower with healthy choices, you may need to take medicine. ?This information is not intended to replace advice given to you by your health care provider. Make sure you discuss any questions you have with your health care provider. ?Document Revised: 09/16/2021 Document Reviewed: 09/16/2021 ?Elsevier Patient Education ? 2023 [employer] ? ?

## 2022-08-18 LAB — LIPID PANEL
Chol/HDL Ratio: 2.7 ratio (ref 0.0–5.0)
Cholesterol, Total: 147 mg/dL (ref 100–199)
HDL: 55 mg/dL (ref >39)
LDL Chol Calc (NIH): 83 mg/dL (ref 0–99)
Triglycerides: 36 mg/dL (ref 0–149)
VLDL Cholesterol Cal: 9 mg/dL (ref 5–40)

## 2022-08-23 ENCOUNTER — Ambulatory Visit (INDEPENDENT_AMBULATORY_CARE_PROVIDER_SITE_OTHER): Payer: Medicare Other

## 2022-08-23 VITALS — BP 120/72 | HR 72 | Temp 98.6°F | Ht 74.0 in | Wt 227.0 lb

## 2022-08-23 DIAGNOSIS — Z23 Encounter for immunization: Secondary | ICD-10-CM | POA: Diagnosis not present

## 2022-08-23 DIAGNOSIS — I129 Hypertensive chronic kidney disease with stage 1 through stage 4 chronic kidney disease, or unspecified chronic kidney disease: Secondary | ICD-10-CM

## 2022-08-23 NOTE — Progress Notes (Signed)
Patient presents today for bp check and flu shot. Patient's bp was 120/72 he was advised to continue his current medications and follow up with is provider. YL,RMA

## 2022-09-10 IMAGING — MR MR HIP*L* W/O CM
6 of 7 series · 31 of 40 positions shown · non-contrast
Comparison: None.

CLINICAL DATA: Left hip pain for 5-6 months.

EXAM:
MR OF THE LEFT HIP WITHOUT CONTRAST
TECHNIQUE: Multiplanar, multisequence MR imaging was performed. No intravenous
contrast was administered.

[Series 9: T2 fat-sat · coronal · left · 3.0mm · 0.89mm/px · 4 of 30 slices shown (1 of 3)]
[im 1/30]
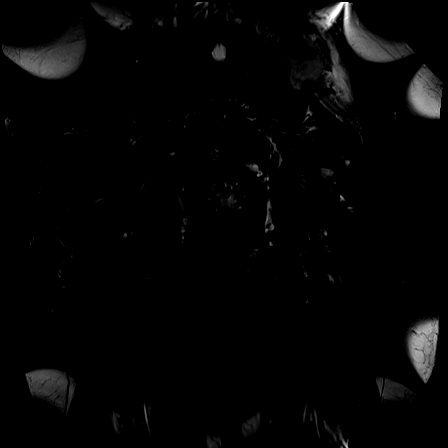
[im 10/30]
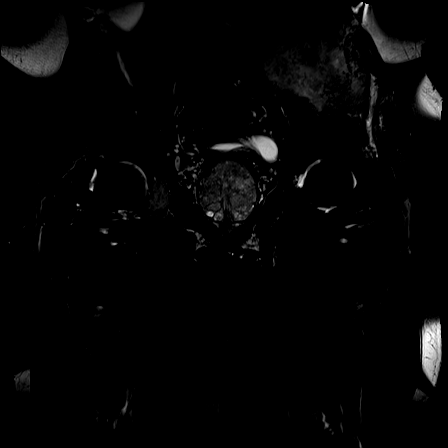
[im 20/30]
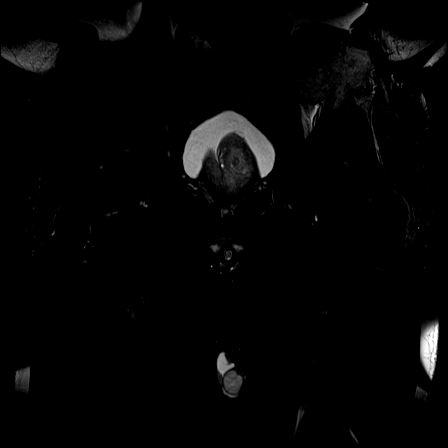
[im 30/30]
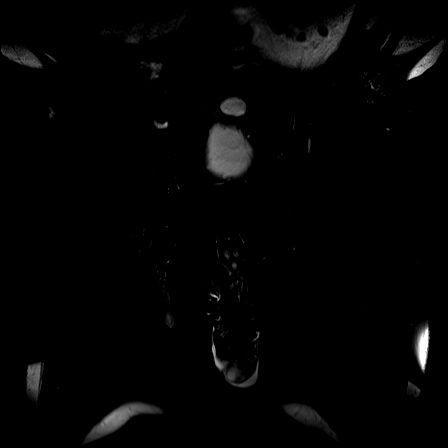

[Series 10: T1 · coronal · left · 3.0mm · 0.89mm/px · 3 of 30 slices shown]
[im 1/30]
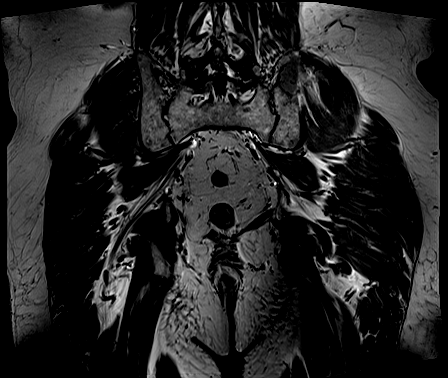
[im 8/30]
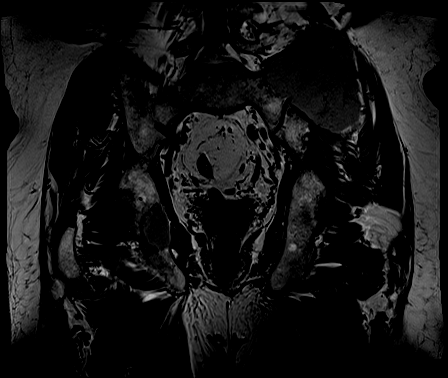
[im 15/30]
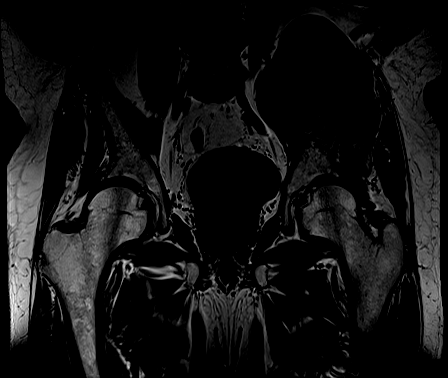

[Series 12: T2 fat-sat · axial · left · 5.0mm · 0.78mm/px · z∈[+4,+305]mm · 7 of 44 slices shown (2 of 3)]
[im 1/44]
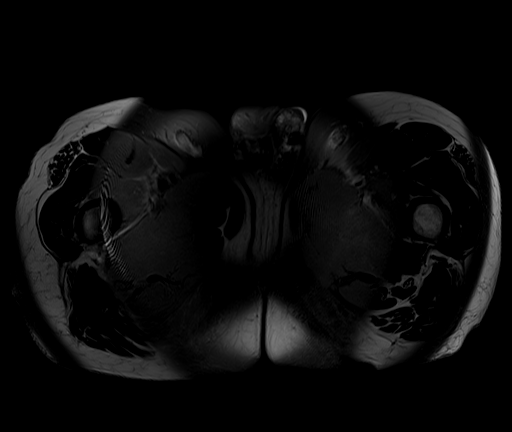
[im 8/44]
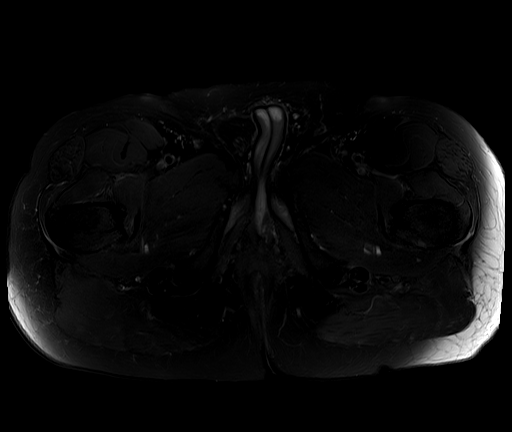
[im 15/44]
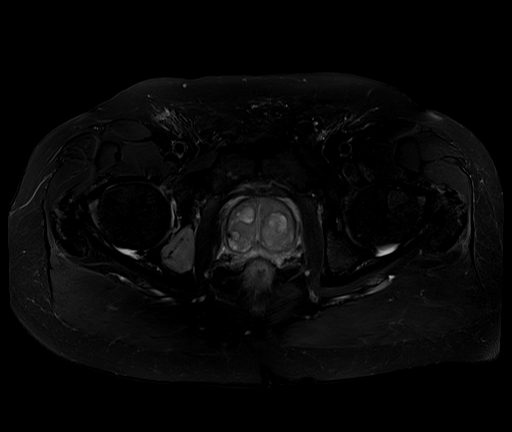
[im 22/44]
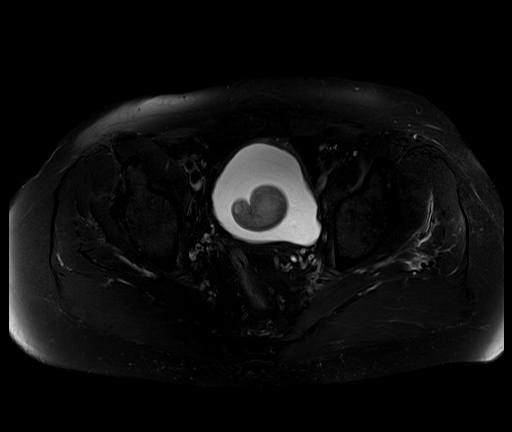
[im 29/44]
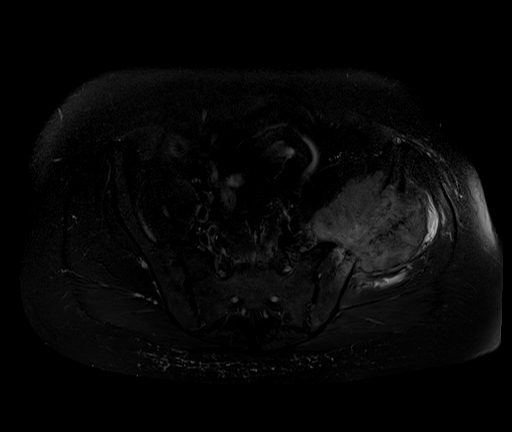
[im 36/44]
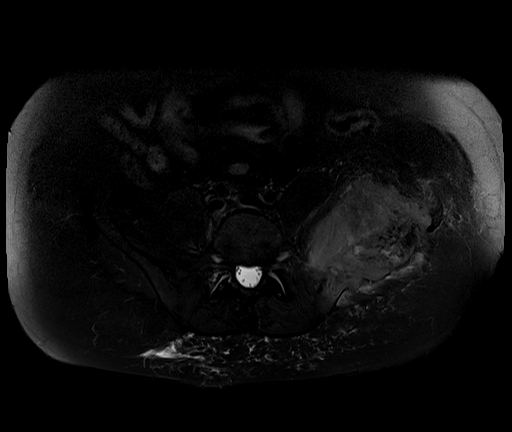
[im 44/44]
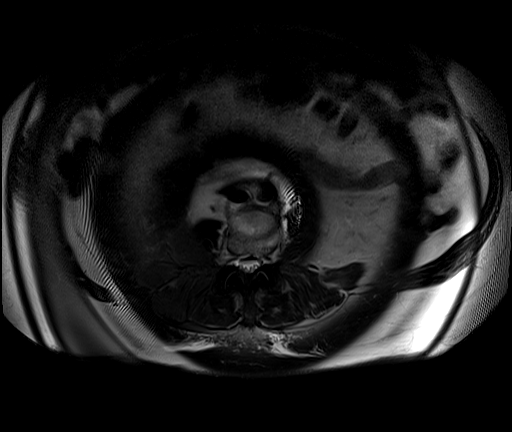

[Series 13: T2 fat-sat · axial · left · 3.0mm · 1.25mm/px · z∈[+31,+160]mm · 6 of 37 slices shown (3 of 3)]
[im 1/37]
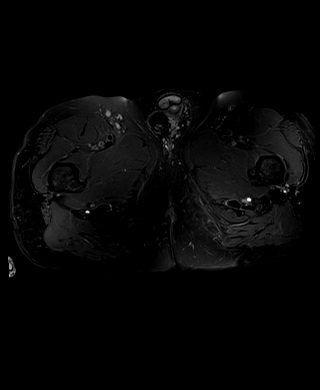
[im 8/37]
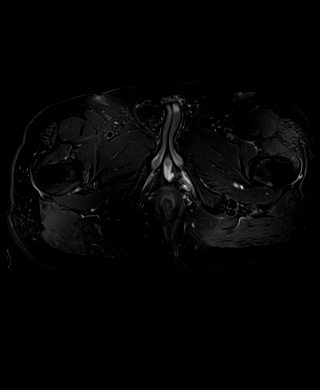
[im 15/37]
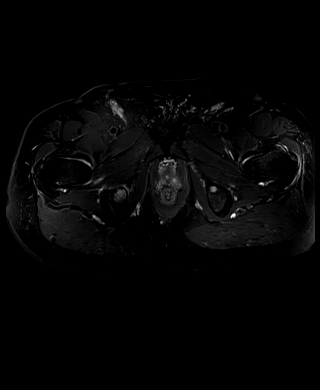
[im 22/37]
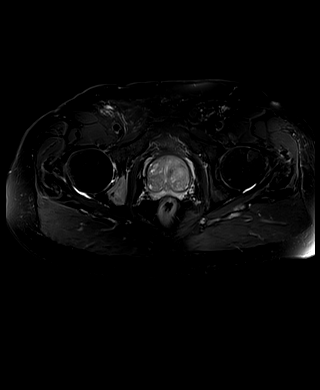
[im 29/37]
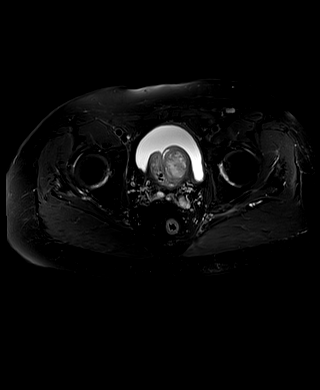
[im 37/37]
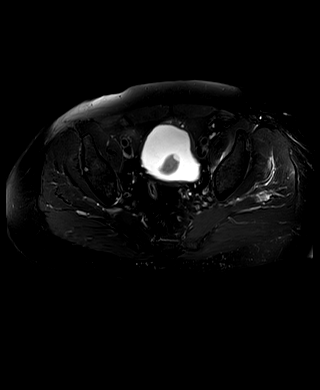

[Series 14: PD fat-sat · sagittal · left · 3.0mm · 1.00mm/px · 7 of 48 slices shown (1 of 2)]
[im 1/48]
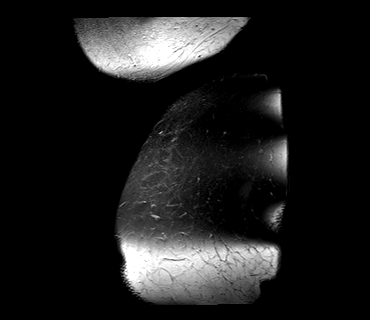
[im 8/48]
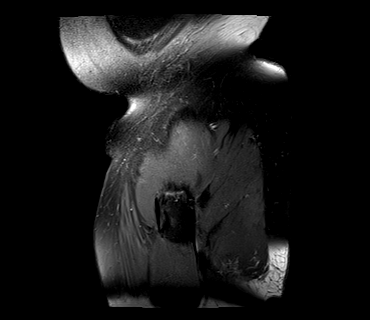
[im 16/48]
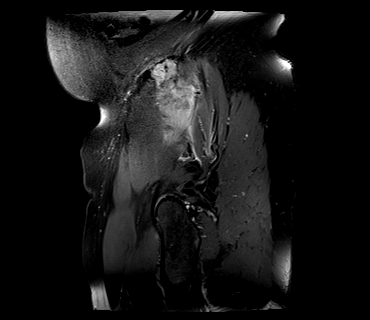
[im 24/48]
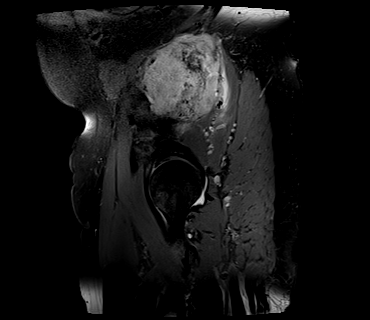
[im 32/48]
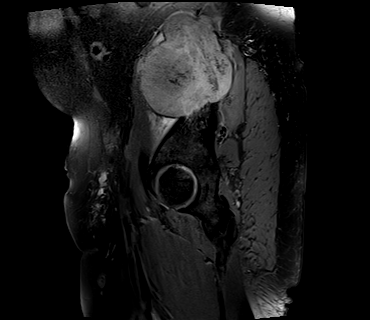
[im 40/48]
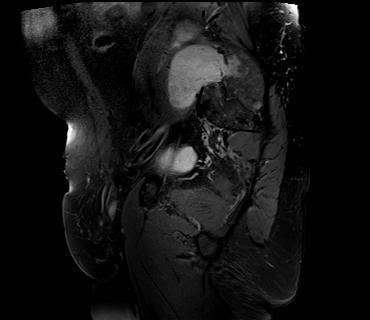
[im 48/48]
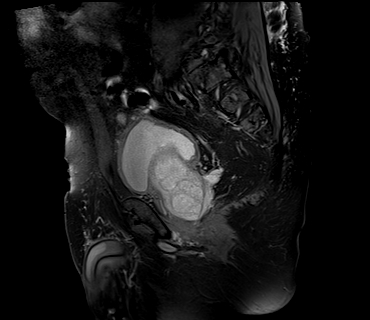

[Series 15: PD fat-sat · coronal · left · 3.0mm · 0.89mm/px · 4 of 28 slices shown (2 of 2)]
[im 1/28]
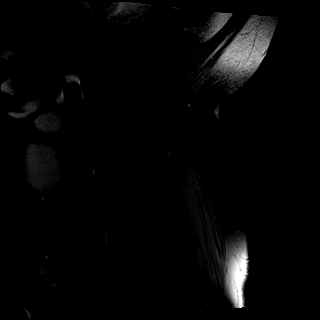
[im 10/28]
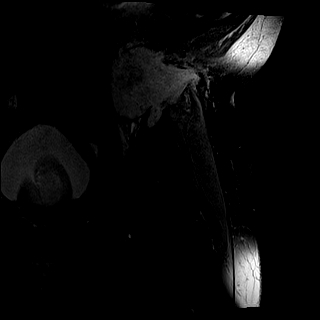
[im 19/28]
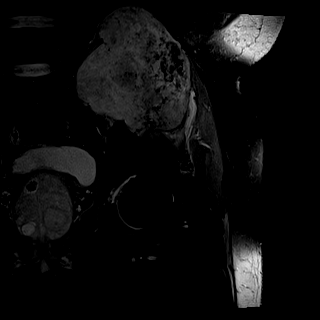
[im 28/28]
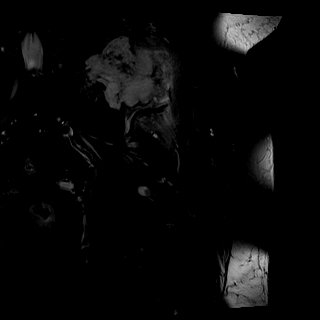

[31 of 40 positions shown; findings below may reference images not displayed]

FINDINGS: Bones: There is an expansile, destructive mass lesion in the left
ilium which measures 10 cm AP by 11 cm craniocaudal by 10 cm
transverse. The lesion is mildly hyperintense to muscle on T1
weighted imaging, intermediate signal intensity on T2 weighted
imaging and has multiple punctate foci of decreased T1 and T2 signal
likely representing calcifications. The lesion extends into the
pelvis. There is also a small focus of marrow signal abnormality in
the left sacrum adjacent to the lesion likely due to invasion. A
second lesion is seen in the right posterior acetabulum which
measures 4 cm craniocaudal x 3.5 cm AP x 2.3 cm transverse.

Articular cartilage and labrum

Articular cartilage:  Preserved.

Labrum:  The anterior and superior labrum are degenerated.

Joint or bursal effusion

Joint effusion:  None.

Bursae: Negative.

Muscles and tendons

Muscles and tendons:  Intact.

Other findings

Miscellaneous: There is massive enlargement of the prostate gland
which extends into the bladder. The prostate measures 6.1 cm AP x
8.4 cm craniocaudal by 5.9 cm transverse.
IMPRESSION: The examination is positive for a large destructive cancerous lesion
in the left ilium and a smaller metastatic deposit, in the right
acetabulum. The primary lesion is not identified. Lesion left ilium
as an appearance suggestive of chondrosarcoma but could also be a
large, blowout metastasis from a tumor such as renal cell carcinoma.

Marked enlargement of the prostate gland. The patient's bone lesions
are not typical of prostate cancer.

These results will be called to the ordering clinician or
representative by the Radiologist Assistant, and communication
documented in the PACS or [REDACTED].

## 2022-09-11 ENCOUNTER — Other Ambulatory Visit: Payer: Self-pay | Admitting: Internal Medicine

## 2022-09-13 ENCOUNTER — Ambulatory Visit: Payer: Medicare Other

## 2022-09-13 DIAGNOSIS — H2511 Age-related nuclear cataract, right eye: Secondary | ICD-10-CM | POA: Diagnosis not present

## 2022-09-26 DIAGNOSIS — Z9481 Bone marrow transplant status: Secondary | ICD-10-CM | POA: Diagnosis not present

## 2022-09-26 DIAGNOSIS — M25551 Pain in right hip: Secondary | ICD-10-CM | POA: Diagnosis not present

## 2022-09-26 DIAGNOSIS — Z7901 Long term (current) use of anticoagulants: Secondary | ICD-10-CM | POA: Diagnosis not present

## 2022-09-26 DIAGNOSIS — G8929 Other chronic pain: Secondary | ICD-10-CM | POA: Diagnosis not present

## 2022-09-26 DIAGNOSIS — K5903 Drug induced constipation: Secondary | ICD-10-CM | POA: Diagnosis not present

## 2022-09-26 DIAGNOSIS — C9001 Multiple myeloma in remission: Secondary | ICD-10-CM | POA: Diagnosis not present

## 2022-09-26 DIAGNOSIS — Z79899 Other long term (current) drug therapy: Secondary | ICD-10-CM | POA: Diagnosis not present

## 2022-09-26 DIAGNOSIS — Z86711 Personal history of pulmonary embolism: Secondary | ICD-10-CM | POA: Diagnosis not present

## 2022-09-26 DIAGNOSIS — Z23 Encounter for immunization: Secondary | ICD-10-CM | POA: Diagnosis not present

## 2022-10-05 ENCOUNTER — Encounter: Payer: Self-pay | Admitting: Ophthalmology

## 2022-10-10 NOTE — Discharge Instructions (Signed)

## 2022-10-12 ENCOUNTER — Ambulatory Visit
Admission: RE | Admit: 2022-10-12 | Discharge: 2022-10-12 | Disposition: A | Discharge status: Home / Self Care | Payer: Medicare Other | Source: Ambulatory Visit | Attending: Ophthalmology | Admitting: Ophthalmology

## 2022-10-12 ENCOUNTER — Encounter: Payer: Self-pay | Admitting: Ophthalmology

## 2022-10-12 ENCOUNTER — Ambulatory Visit: Payer: Medicare Other | Admitting: Anesthesiology

## 2022-10-12 ENCOUNTER — Other Ambulatory Visit: Payer: Self-pay

## 2022-10-12 ENCOUNTER — Encounter
Admission: RE | Disposition: A | Discharge status: Home / Self Care | Payer: Self-pay | Source: Ambulatory Visit | Attending: Ophthalmology

## 2022-10-12 DIAGNOSIS — Z87891 Personal history of nicotine dependence: Secondary | ICD-10-CM | POA: Insufficient documentation

## 2022-10-12 DIAGNOSIS — H2511 Age-related nuclear cataract, right eye: Secondary | ICD-10-CM | POA: Insufficient documentation

## 2022-10-12 DIAGNOSIS — I10 Essential (primary) hypertension: Secondary | ICD-10-CM | POA: Diagnosis not present

## 2022-10-12 DIAGNOSIS — I4891 Unspecified atrial fibrillation: Secondary | ICD-10-CM | POA: Insufficient documentation

## 2022-10-12 DIAGNOSIS — I2510 Atherosclerotic heart disease of native coronary artery without angina pectoris: Secondary | ICD-10-CM | POA: Diagnosis not present

## 2022-10-12 DIAGNOSIS — E1136 Type 2 diabetes mellitus with diabetic cataract: Secondary | ICD-10-CM | POA: Diagnosis not present

## 2022-10-12 DIAGNOSIS — G4730 Sleep apnea, unspecified: Secondary | ICD-10-CM | POA: Diagnosis not present

## 2022-10-12 HISTORY — DX: Unspecified osteoarthritis, unspecified site: M19.90

## 2022-10-12 HISTORY — PX: CATARACT EXTRACTION W/PHACO: SHX586

## 2022-10-12 HISTORY — DX: Presence of dental prosthetic device (complete) (partial): Z97.2

## 2022-10-12 SURGERY — PHACOEMULSIFICATION, CATARACT, WITH IOL INSERTION
Anesthesia: Monitor Anesthesia Care | Site: Eye | Laterality: Right

## 2022-10-12 MED ORDER — SIGHTPATH DOSE#1 BSS IO SOLN
INTRAOCULAR | Status: DC | PRN
  Administered 2022-10-12: 15 mL

## 2022-10-12 MED ORDER — FENTANYL CITRATE PF 50 MCG/ML IJ SOSY: 25.0000 ug | PREFILLED_SYRINGE | INTRAMUSCULAR | Status: DC | PRN

## 2022-10-12 MED ORDER — ARMC OPHTHALMIC DILATING DROPS
1.0000 | OPHTHALMIC | Status: DC | PRN
  Administered 2022-10-12 (×3): 1 via OPHTHALMIC

## 2022-10-12 MED ORDER — ONDANSETRON HCL 4 MG/2ML IJ SOLN: 4.0000 mg | Freq: Once | INTRAMUSCULAR | Status: DC | PRN

## 2022-10-12 MED ORDER — SIGHTPATH DOSE#1 NA HYALUR & NA CHOND-NA HYALUR IO KIT
PACK | INTRAOCULAR | Status: DC | PRN
  Administered 2022-10-12: 1 via OPHTHALMIC

## 2022-10-12 MED ORDER — LACTATED RINGERS IV SOLN: INTRAVENOUS | Status: DC

## 2022-10-12 MED ORDER — FENTANYL CITRATE (PF) 100 MCG/2ML IJ SOLN
INTRAMUSCULAR | Status: DC | PRN
  Administered 2022-10-12: 50 ug via INTRAVENOUS

## 2022-10-12 MED ORDER — BRIMONIDINE TARTRATE-TIMOLOL 0.2-0.5 % OP SOLN
OPHTHALMIC | Status: DC | PRN
  Administered 2022-10-12: 1 [drp] via OPHTHALMIC

## 2022-10-12 MED ORDER — CEFUROXIME OPHTHALMIC INJECTION 1 MG/0.1 ML
INJECTION | OPHTHALMIC | Status: DC | PRN
  Administered 2022-10-12: 0.1 mL via INTRACAMERAL

## 2022-10-12 MED ORDER — TETRACAINE HCL 0.5 % OP SOLN
1.0000 [drp] | OPHTHALMIC | Status: DC | PRN
  Administered 2022-10-12 (×3): 1 [drp] via OPHTHALMIC

## 2022-10-12 MED ORDER — SIGHTPATH DOSE#1 BSS IO SOLN
INTRAOCULAR | Status: DC | PRN
  Administered 2022-10-12: 1 mL via INTRAMUSCULAR

## 2022-10-12 MED ORDER — SIGHTPATH DOSE#1 BSS IO SOLN
INTRAOCULAR | Status: DC | PRN
  Administered 2022-10-12: 65 mL via OPHTHALMIC

## 2022-10-12 MED ORDER — MIDAZOLAM HCL 2 MG/2ML IJ SOLN
INTRAMUSCULAR | Status: DC | PRN
  Administered 2022-10-12 (×2): 1 mg via INTRAVENOUS

## 2022-10-12 SURGICAL SUPPLY — 11 items
CATARACT SUITE SIGHTPATH (MISCELLANEOUS) ×1 IMPLANT
FEE CATARACT SUITE SIGHTPATH (MISCELLANEOUS) ×1 IMPLANT
GLOVE SRG 8 PF TXTR STRL LF DI (GLOVE) ×1 IMPLANT
GLOVE SURG ENC TEXT LTX SZ7.5 (GLOVE) ×1 IMPLANT
GLOVE SURG UNDER POLY LF SZ8 (GLOVE) ×1
LENS CLAREON PAN OPTIX 205 ×1 IMPLANT
LENS IOL CLRN PAN 20.5 IMPLANT
NDL FILTER BLUNT 18X1 1/2 (NEEDLE) ×1 IMPLANT
NEEDLE FILTER BLUNT 18X1 1/2 (NEEDLE) ×1 IMPLANT
SYR 3ML LL SCALE MARK (SYRINGE) ×1 IMPLANT
WATER STERILE IRR 250ML POUR (IV SOLUTION) ×1 IMPLANT

## 2022-10-12 NOTE — Anesthesia Postprocedure Evaluation (Signed)
Anesthesia Post Note  Patient: Edward Gallagher  Procedure(s) Performed: CATARACT EXTRACTION PHACO AND INTRAOCULAR LENS PLACEMENT (IOC) RIGHT CLARION PANOPTIC LENS 8.36 01:03.5 (Right: Eye)  Patient location during evaluation: PACU Anesthesia Type: MAC Level of consciousness: awake and alert Pain management: pain level controlled Vital Signs Assessment: post-procedure vital signs reviewed and stable Respiratory status: spontaneous breathing, nonlabored ventilation, respiratory function stable and patient connected to nasal cannula oxygen Cardiovascular status: stable and blood pressure returned to baseline Postop Assessment: no apparent nausea or vomiting Anesthetic complications: no   No notable events documented.   Last Vitals:  Vitals:   10/12/22 0854 10/12/22 0900  BP: 96/79 135/68  Pulse: (!) 58 (!) 58  Resp: 12 12  Temp: (!) 36.4 C   SpO2: 99% 100%    Last Pain:  Vitals:   10/12/22 0900  TempSrc:   PainSc: 0-No pain                 Molli Barrows

## 2022-10-12 NOTE — H&P (Signed)
[facility]   Primary Care Physician:  Glendale Chard, MD Ophthalmologist: Dr. Leandrew Koyanagi  Pre-Procedure History & Physical: HPI:  Edward Gallagher is a 81 y.o. male here for ophthalmic surgery.   Past Medical History:  Diagnosis Date   A-fib [facility])    Allergy    Arthritis    Hyperlipidemia    Hypertension    Multiple myeloma (HCC)    Prostatic hypertrophy    Sinus trouble    Sleep apnea    CPAP   Wears dentures    full upper, partial lower    Past Surgical History:  Procedure Laterality Date   HERNIA REPAIR     VASECTOMY      Prior to Admission medications   Medication Sig Start Date End Date Taking? Authorizing Provider  acyclovir (ZOVIRAX) 400 MG tablet Take 400 mg by mouth 2 (two) times daily. 04/13/21  Yes [provider]  amLODipine (NORVASC) 2.5 MG tablet Take 1 tablet (2.5 mg total) by mouth daily. 08/17/22 08/17/23 Yes Glendale Chard, MD  atorvastatin (LIPITOR) 20 MG tablet Take 20 mg by mouth daily.   Yes [provider]  cetirizine (ZYRTEC) 10 MG tablet Take 10 mg by mouth daily.   Yes [provider]  ELIQUIS 5 MG TABS tablet TAKE 1 TABLET(5 MG) BY MOUTH TWICE DAILY 03/14/22  Yes Patwardhan, Manish J, MD  finasteride (PROSCAR) 5 MG tablet Take 5 mg by mouth daily.   Yes [provider]  fluticasone (FLONASE) 50 MCG/ACT nasal spray SHAKE LIQUID AND USE 1 SPRAY IN EACH NOSTRIL DAILY 07/26/22  Yes Glendale Chard, MD  lenalidomide (REVLIMID) 5 MG capsule Take 5 mg by mouth daily.   Yes [provider]  Misc Natural Products (NEURIVA PO) Take by mouth at bedtime.   Yes [provider]  traMADol (ULTRAM) 50 MG tablet Take 1 tablet (50 mg total) by mouth every 12 (twelve) hours as needed. 04/13/22 04/13/23 Yes Glendale Chard, MD  triamcinolone (NASACORT) 55 MCG/ACT AERO nasal inhaler Place 2 sprays into the nose daily.   Yes [provider]  Xylitol (XYLIMELTS MT) Use as directed in the mouth or throat  at bedtime.   Yes [provider]  nitroGLYCERIN (NITROSTAT) 0.4 MG SL tablet Place 1 tablet (0.4 mg total) under the tongue every 5 (five) minutes as needed for chest pain. 03/31/21 08/17/22  Glendale Chard, MD  terazosin (HYTRIN) 10 MG capsule TAKE 1 CAPSULE(10 MG) BY MOUTH AT BEDTIME Patient taking differently: Take 5 mg by mouth at bedtime. 09/12/22   Glendale Chard, MD    Allergies as of 08/23/2022   (No Known Allergies)    Family History  Problem Relation Age of Onset   Healthy Mother    Prostate cancer Father    Testicular cancer Brother    Prostate cancer Brother    Lung cancer Brother    Cancer Brother        double mynoma   Hypertension Other     Social History   Socioeconomic History   Marital status: Married    Spouse name: Not on file   Number of children: 0   Years of education: Not on file   Highest education level: Not on file  Occupational History   Occupation: retired  Tobacco Use   Smoking status: Former    Packs/day: 0.50    Years: 35.00    Total pack years: 17.50    Types: Cigarettes    Quit date: 12/12/1997  Years since quitting: 24.8   Smokeless tobacco: Never   Tobacco comments:    12 cigs daily  Vaping Use   Vaping Use: Never used  Substance and Sexual Activity   Alcohol use: Yes    Comment: ocasional beer   Drug use: No   Sexual activity: Not Currently  Other Topics Concern   Not on file  Social History Narrative   Not on file   Social Determinants of Health   Financial Resource Strain: Low Risk  (04/06/2022)   Overall Financial Resource Strain (CARDIA)    Difficulty of Paying Living Expenses: Not hard at all  Food Insecurity: No Food Insecurity (04/06/2022)   Hunger Vital Sign    Worried About Running Out of Food in the Last Year: Never true    Ran Out of Food in the Last Year: Never true  Transportation Needs: No Transportation Needs (04/06/2022)   PRAPARE - Hydrologist (Medical): No    Lack  of Transportation (Non-Medical): No  Physical Activity: Inactive (04/06/2022)   Exercise Vital Sign    Days of Exercise per Week: 0 days    Minutes of Exercise per Session: 0 min  Stress: No Stress Concern Present (04/06/2022)   [place]    Feeling of Stress : Not at all  Social Connections: Not on file  Intimate Partner Violence: Not At Risk (05/08/2019)   Humiliation, Afraid, Rape, and Kick questionnaire    Fear of Current or Ex-Partner: No    Emotionally Abused: No    Physically Abused: No    Sexually Abused: No    Review of Systems: See HPI, otherwise negative ROS  Physical Exam: BP 133/74   Pulse 68   Temp 98.1 F (36.7 C) (Temporal)   Resp 18   Ht _0  (1.905 m)   Wt 102.5 kg   SpO2 99%   BMI 28.24 kg/m  General:   Alert,  pleasant and cooperative in NAD Head:  Normocephalic and atraumatic. Lungs:  Clear to auscultation.    Heart:  Regular rate and rhythm.   Impression/Plan: Edward Gallagher is here for ophthalmic surgery.  Risks, benefits, limitations, and alternatives regarding ophthalmic surgery have been reviewed with the patient.  Questions have been answered.  All parties agreeable.   Leandrew Koyanagi, MD  10/12/2022, 8:03 AM

## 2022-10-12 NOTE — Transfer of Care (Signed)
Immediate Anesthesia Transfer of Care Note  Patient: Edward Gallagher  Procedure(s) Performed: CATARACT EXTRACTION PHACO AND INTRAOCULAR LENS PLACEMENT (IOC) RIGHT CLARION PANOPTIC LENS 8.36 01:03.5 (Right: Eye)  Patient Location: PACU  Anesthesia Type: MAC  Level of Consciousness: awake, alert  and patient cooperative  Airway and Oxygen Therapy: Patient Spontanous Breathing and Patient connected to supplemental oxygen  Post-op Assessment: Post-op Vital signs reviewed, Patient's Cardiovascular Status Stable, Respiratory Function Stable, Patent Airway and No signs of Nausea or vomiting  Post-op Vital Signs: Reviewed and stable  Complications: No notable events documented.

## 2022-10-12 NOTE — Anesthesia Preprocedure Evaluation (Signed)
Anesthesia Evaluation  Patient identified by MRN, date of birth, ID band Patient awake    Reviewed: Allergy & Precautions, H&P , NPO status , Patient's Chart, lab work & pertinent test results, reviewed documented beta blocker date and time   Airway Mallampati: II  TM Distance: >3 FB Neck ROM: full    Dental no notable dental hx. (+) Teeth Intact   Pulmonary neg pulmonary ROS, sleep apnea , former smoker,    Pulmonary exam normal breath sounds clear to auscultation       Cardiovascular Exercise Tolerance: Good hypertension, On Medications + CAD   Rhythm:regular Rate:Normal     Neuro/Psych negative neurological ROS  negative psych ROS   GI/Hepatic negative GI ROS, Neg liver ROS,   Endo/Other  negative endocrine ROSdiabetes  Renal/GU      Musculoskeletal   Abdominal   Peds  Hematology negative hematology ROS (+) Blood dyscrasia, anemia ,   Anesthesia Other Findings   Reproductive/Obstetrics negative OB ROS                             Anesthesia Physical Anesthesia Plan  ASA: 3  Anesthesia Plan: MAC   Post-op Pain Management:    Induction:   PONV Risk Score and Plan:   Airway Management Planned:   Additional Equipment:   Intra-op Plan:   Post-operative Plan:   Informed Consent: I have reviewed the patients History and Physical, chart, labs and discussed the procedure including the risks, benefits and alternatives for the proposed anesthesia with the patient or authorized representative who has indicated his/her understanding and acceptance.       Plan Discussed with: CRNA  Anesthesia Plan Comments:         Anesthesia Quick Evaluation

## 2022-10-12 NOTE — Op Note (Signed)
LOCATION:  [facility]   PREOPERATIVE DIAGNOSIS:    Nuclear sclerotic cataract right eye. H25.11   POSTOPERATIVE DIAGNOSIS:  Nuclear sclerotic cataract right eye.     PROCEDURE:  Phacoemusification with posterior chamber intraocular lens placement of the right eye   ULTRASOUND TIME: Procedure(s) with comments: CATARACT EXTRACTION PHACO AND INTRAOCULAR LENS PLACEMENT (IOC) RIGHT CLARION PANOPTIC LENS 8.36 01:03.5 (Right) - sleep apnea  LENS:   Implant Name Type Inv. Item Serial No. Manufacturer Lot No. LRB No. Used Action  LENS CLAREON PAN OPTIX 205 - [id number]  LENS CLAREON PAN OPTIX 205 [id number] [employer]IGHTPATH  Right 1 Implanted    [id number] 20.5 Panoptix IOL     SURGEON:  Wyonia Hough, MD   ANESTHESIA:  Topical with tetracaine drops and 2% Xylocaine jelly, augmented with 1% preservative-free intracameral lidocaine.    COMPLICATIONS:  None.   DESCRIPTION OF PROCEDURE:  The patient was identified in the holding room and transported to the operating room and placed in the supine position under the operating microscope.  The right eye was identified as the operative eye and it was prepped and draped in the usual sterile ophthalmic fashion.   A 1 millimeter clear-corneal paracentesis was made at the 12:00 position.  0.5 ml of preservative-free 1% lidocaine was injected into the anterior chamber. The anterior chamber was filled with Viscoat viscoelastic.  A 2.4 millimeter keratome was used to make a near-clear corneal incision at the 9:00 position.  A curvilinear capsulorrhexis was made with a cystotome and capsulorrhexis forceps.  Balanced salt solution was used to hydrodissect and hydrodelineate the nucleus.   Phacoemulsification was then used in stop and chop fashion to remove the lens nucleus and epinucleus.  The remaining cortex was then removed using the irrigation and aspiration handpiece. Provisc was then placed into the capsular bag to distend it for lens  placement.  A lens was then injected into the capsular bag.  The remaining viscoelastic was aspirated. During aspiration, an area of zonular weakness was noted nasally for about two clock hours.  The centration of the lens was not affected.  Centration was verified with the Verion centration feature.   Wounds were hydrated with balanced salt solution.  The anterior chamber was inflated to a physiologic pressure with balanced salt solution.  No wound leaks were noted. Cefuroxime 0.1 ml of a '10mg'$ /ml solution was injected into the anterior chamber for a dose of 1 mg of intracameral antibiotic at the completion of the case.   Timolol and Brimonidine drops were applied to the eye.  The patient was taken to the recovery room in stable condition without complications of anesthesia or surgery.   Aymara Sassi 10/12/2022, 8:52 AM

## 2022-10-13 ENCOUNTER — Encounter: Payer: Self-pay | Admitting: Ophthalmology

## 2022-10-18 DIAGNOSIS — H2512 Age-related nuclear cataract, left eye: Secondary | ICD-10-CM | POA: Diagnosis not present

## 2022-10-25 NOTE — Discharge Instructions (Signed)

## 2022-10-26 ENCOUNTER — Encounter: Payer: Self-pay | Admitting: Ophthalmology

## 2022-10-26 ENCOUNTER — Ambulatory Visit
Admission: RE | Admit: 2022-10-26 | Discharge: 2022-10-26 | Disposition: A | Discharge status: Home / Self Care | Payer: Medicare Other | Source: Ambulatory Visit | Attending: Ophthalmology | Admitting: Ophthalmology

## 2022-10-26 ENCOUNTER — Ambulatory Visit: Payer: Medicare Other | Admitting: General Practice

## 2022-10-26 ENCOUNTER — Encounter
Admission: RE | Disposition: A | Discharge status: Home / Self Care | Payer: Self-pay | Source: Ambulatory Visit | Attending: Ophthalmology

## 2022-10-26 ENCOUNTER — Other Ambulatory Visit: Payer: Self-pay

## 2022-10-26 DIAGNOSIS — H2512 Age-related nuclear cataract, left eye: Secondary | ICD-10-CM | POA: Insufficient documentation

## 2022-10-26 DIAGNOSIS — I2510 Atherosclerotic heart disease of native coronary artery without angina pectoris: Secondary | ICD-10-CM | POA: Insufficient documentation

## 2022-10-26 DIAGNOSIS — E1136 Type 2 diabetes mellitus with diabetic cataract: Secondary | ICD-10-CM | POA: Diagnosis not present

## 2022-10-26 DIAGNOSIS — Z79899 Other long term (current) drug therapy: Secondary | ICD-10-CM | POA: Diagnosis not present

## 2022-10-26 DIAGNOSIS — Z87891 Personal history of nicotine dependence: Secondary | ICD-10-CM | POA: Insufficient documentation

## 2022-10-26 DIAGNOSIS — Z7901 Long term (current) use of anticoagulants: Secondary | ICD-10-CM | POA: Diagnosis not present

## 2022-10-26 DIAGNOSIS — I10 Essential (primary) hypertension: Secondary | ICD-10-CM | POA: Diagnosis not present

## 2022-10-26 DIAGNOSIS — G4730 Sleep apnea, unspecified: Secondary | ICD-10-CM | POA: Insufficient documentation

## 2022-10-26 DIAGNOSIS — I4891 Unspecified atrial fibrillation: Secondary | ICD-10-CM | POA: Diagnosis not present

## 2022-10-26 HISTORY — PX: CATARACT EXTRACTION W/PHACO: SHX586

## 2022-10-26 SURGERY — PHACOEMULSIFICATION, CATARACT, WITH IOL INSERTION
Anesthesia: Monitor Anesthesia Care | Site: Eye | Laterality: Left

## 2022-10-26 MED ORDER — SIGHTPATH DOSE#1 BSS IO SOLN
INTRAOCULAR | Status: DC | PRN
  Administered 2022-10-26: 15 mL

## 2022-10-26 MED ORDER — ARMC OPHTHALMIC DILATING DROPS
1.0000 | OPHTHALMIC | Status: DC | PRN
  Administered 2022-10-26 (×3): 1 via OPHTHALMIC

## 2022-10-26 MED ORDER — BRIMONIDINE TARTRATE-TIMOLOL 0.2-0.5 % OP SOLN
OPHTHALMIC | Status: DC | PRN
  Administered 2022-10-26: 1 [drp] via OPHTHALMIC

## 2022-10-26 MED ORDER — FENTANYL CITRATE (PF) 100 MCG/2ML IJ SOLN
INTRAMUSCULAR | Status: DC | PRN
  Administered 2022-10-26 (×2): 50 ug via INTRAVENOUS

## 2022-10-26 MED ORDER — SIGHTPATH DOSE#1 NA HYALUR & NA CHOND-NA HYALUR IO KIT
PACK | INTRAOCULAR | Status: DC | PRN
  Administered 2022-10-26: 1 via OPHTHALMIC

## 2022-10-26 MED ORDER — SIGHTPATH DOSE#1 BSS IO SOLN
INTRAOCULAR | Status: DC | PRN
  Administered 2022-10-26: 61 mL via OPHTHALMIC

## 2022-10-26 MED ORDER — MIDAZOLAM HCL 2 MG/2ML IJ SOLN
INTRAMUSCULAR | Status: DC | PRN
  Administered 2022-10-26: 2 mg via INTRAVENOUS

## 2022-10-26 MED ORDER — SIGHTPATH DOSE#1 BSS IO SOLN
INTRAOCULAR | Status: DC | PRN
  Administered 2022-10-26: 1 mL via INTRAMUSCULAR

## 2022-10-26 MED ORDER — CEFUROXIME OPHTHALMIC INJECTION 1 MG/0.1 ML
INJECTION | OPHTHALMIC | Status: DC | PRN
  Administered 2022-10-26: .1 mL via INTRACAMERAL

## 2022-10-26 MED ORDER — TETRACAINE HCL 0.5 % OP SOLN
1.0000 [drp] | OPHTHALMIC | Status: DC | PRN
  Administered 2022-10-26 (×3): 1 [drp] via OPHTHALMIC

## 2022-10-26 SURGICAL SUPPLY — 11 items
CATARACT SUITE SIGHTPATH (MISCELLANEOUS) ×1 IMPLANT
FEE CATARACT SUITE SIGHTPATH (MISCELLANEOUS) ×1 IMPLANT
GLOVE SRG 8 PF TXTR STRL LF DI (GLOVE) ×1 IMPLANT
GLOVE SURG ENC TEXT LTX SZ7.5 (GLOVE) ×1 IMPLANT
GLOVE SURG UNDER POLY LF SZ8 (GLOVE) ×1
LENS CLAREON PAN OPTIX 20.0 ×1 IMPLANT
LENS IOL CLRN PAN 20.0 IMPLANT
NDL FILTER BLUNT 18X1 1/2 (NEEDLE) ×1 IMPLANT
NEEDLE FILTER BLUNT 18X1 1/2 (NEEDLE) ×1 IMPLANT
SYR 3ML LL SCALE MARK (SYRINGE) ×1 IMPLANT
WATER STERILE IRR 250ML POUR (IV SOLUTION) ×1 IMPLANT

## 2022-10-26 NOTE — Anesthesia Postprocedure Evaluation (Signed)
Anesthesia Post Note  Patient: Edward Gallagher  Procedure(s) Performed: CATARACT EXTRACTION PHACO AND INTRAOCULAR LENS PLACEMENT (IOC) LEFT CLAREON PANOPTIC LENS  4.20  00:48.1 (Left: Eye)  Patient location during evaluation: PACU Anesthesia Type: MAC Level of consciousness: awake and alert Pain management: pain level controlled Vital Signs Assessment: post-procedure vital signs reviewed and stable Respiratory status: spontaneous breathing, nonlabored ventilation, respiratory function stable and patient connected to nasal cannula oxygen Cardiovascular status: stable and blood pressure returned to baseline Postop Assessment: no apparent nausea or vomiting Anesthetic complications: no   There were no known notable events for this encounter.   Last Vitals:  Vitals:   10/26/22 1222 10/26/22 1336  BP: 133/76 134/68  Pulse:  63  Resp: 13 18  Temp: 36.8 C (!) 36.4 C  SpO2: 99% 97%    Last Pain:  Vitals:   10/26/22 1336  TempSrc:   PainSc: 0-No pain                 Ilene Qua

## 2022-10-26 NOTE — Transfer of Care (Signed)
Immediate Anesthesia Transfer of Care Note  Patient: Edward Gallagher  Procedure(s) Performed: CATARACT EXTRACTION PHACO AND INTRAOCULAR LENS PLACEMENT (IOC) LEFT CLAREON PANOPTIC LENS  4.20  00:48.1 (Left: Eye)  Patient Location: PACU  Anesthesia Type: MAC  Level of Consciousness: awake, alert  and patient cooperative  Airway and Oxygen Therapy: Patient Spontanous Breathing and Patient connected to supplemental oxygen  Post-op Assessment: Post-op Vital signs reviewed, Patient's Cardiovascular Status Stable, Respiratory Function Stable, Patent Airway and No signs of Nausea or vomiting  Post-op Vital Signs: Reviewed and stable  Complications: There were no known notable events for this encounter.

## 2022-10-26 NOTE — Op Note (Signed)
  OPERATIVE NOTE  Edward Gallagher [id number] 10/26/2022   PREOPERATIVE DIAGNOSIS:  Nuclear sclerotic cataract left eye. H25.12   POSTOPERATIVE DIAGNOSIS:    Nuclear sclerotic cataract left eye.     PROCEDURE:  Phacoemusification with posterior chamber intraocular lens placement of the left eye  Ultrasound time: Procedure(s) with comments: CATARACT EXTRACTION PHACO AND INTRAOCULAR LENS PLACEMENT (IOC) LEFT CLAREON PANOPTIC LENS  4.20  00:48.1 (Left) - sleep apnea  LENS:   Implant Name Type Inv. Item Serial No. Manufacturer Lot No. LRB No. Used Action  LENS CLAREON VIVITY TORIC 20.0 - [id number]  LENS CLAREON VIVITY TORIC 20.0 [id number] [employer]  Left 1 Implanted    Clareon 20.0 [id number] Panoptix (non-toric)   SURGEON:  Wyonia Hough, MD   ANESTHESIA:  Topical with tetracaine drops and 2% Xylocaine jelly, augmented with 1% preservative-free intracameral lidocaine.    COMPLICATIONS:  None.   DESCRIPTION OF PROCEDURE:  The patient was identified in the holding room and transported to the operating room and placed in the supine position under the operating microscope.  The left eye was identified as the operative eye and it was prepped and draped in the usual sterile ophthalmic fashion.   A 1 millimeter clear-corneal paracentesis was made at the 1:30 position.  0.5 ml of preservative-free 1% lidocaine was injected into the anterior chamber.  The anterior chamber was filled with Viscoat viscoelastic.  A 2.4 millimeter keratome was used to make a near-clear corneal incision at the 10:30 position.  .  A curvilinear capsulorrhexis was made with a cystotome and capsulorrhexis forceps.  Balanced salt solution was used to hydrodissect and hydrodelineate the nucleus.   Phacoemulsification was then used in stop and chop fashion to remove the lens nucleus and epinucleus.  The remaining cortex was then removed using the irrigation and aspiration handpiece. Provisc was then placed into the  capsular bag to distend it for lens placement.  A lens was then injected into the capsular bag.  The remaining viscoelastic was aspirated.   Wounds were hydrated with balanced salt solution.  The anterior chamber was inflated to a physiologic pressure with balanced salt solution.  No wound leaks were noted. Cefuroxime 0.1 ml of a '10mg'$ /ml solution was injected into the anterior chamber for a dose of 1 mg of intracameral antibiotic at the completion of the case.   Timolol and Brimonidine drops were applied to the eye.  The patient was taken to the recovery room in stable condition without complications of anesthesia or surgery.  [name] 10/26/2022, 1:35 PM

## 2022-10-26 NOTE — Anesthesia Preprocedure Evaluation (Signed)
Anesthesia Evaluation  Patient identified by MRN, date of birth, ID band Patient awake    Reviewed: Allergy & Precautions, H&P , NPO status , Patient's Chart, lab work & pertinent test results, reviewed documented beta blocker date and time   Airway Mallampati: II  TM Distance: >3 FB Neck ROM: full    Dental no notable dental hx. (+) Teeth Intact   Pulmonary neg pulmonary ROS, sleep apnea , former smoker   Pulmonary exam normal breath sounds clear to auscultation       Cardiovascular Exercise Tolerance: Good hypertension, On Medications + CAD   Rhythm:regular Rate:Normal     Neuro/Psych negative neurological ROS  negative psych ROS   GI/Hepatic negative GI ROS, Neg liver ROS,,,  Endo/Other  negative endocrine ROSdiabetes    Renal/GU      Musculoskeletal   Abdominal   Peds  Hematology negative hematology ROS (+) Blood dyscrasia, anemia   Anesthesia Other Findings   Reproductive/Obstetrics negative OB ROS                             Anesthesia Physical Anesthesia Plan  ASA: 3  Anesthesia Plan: MAC   Post-op Pain Management: Minimal or no pain anticipated   Induction: Intravenous  PONV Risk Score and Plan:   Airway Management Planned: Natural Airway and Nasal Cannula  Additional Equipment:   Intra-op Plan:   Post-operative Plan:   Informed Consent: I have reviewed the patients History and Physical, chart, labs and discussed the procedure including the risks, benefits and alternatives for the proposed anesthesia with the patient or authorized representative who has indicated his/her understanding and acceptance.       Plan Discussed with: CRNA  Anesthesia Plan Comments:         Anesthesia Quick Evaluation

## 2022-10-26 NOTE — H&P (Signed)
[facility]   Primary Care Physician:  Glendale Chard, MD Ophthalmologist: Dr. Leandrew Koyanagi  Pre-Procedure History & Physical: HPI:  Edward Gallagher is a 81 y.o. male here for ophthalmic surgery.   Past Medical History:  Diagnosis Date   A-fib [facility]    Allergy    Arthritis    Hyperlipidemia    Hypertension    Multiple myeloma ([place])    Prostatic hypertrophy    Sinus trouble    Sleep apnea    CPAP   Wears dentures    full upper, partial lower    Past Surgical History:  Procedure Laterality Date   CATARACT EXTRACTION W/PHACO Right 10/12/2022   Procedure: CATARACT EXTRACTION PHACO AND INTRAOCULAR LENS PLACEMENT ([place]) RIGHT CLARION PANOPTIC LENS 8.36 01:03.5;  Surgeon: Leandrew Koyanagi, MD;  Location: [place];  Service: Ophthalmology;  Laterality: Right;  sleep apnea   HERNIA REPAIR     VASECTOMY      Prior to Admission medications   Medication Sig Start Date End Date Taking? Authorizing Provider  acyclovir (ZOVIRAX) 400 MG tablet Take 400 mg by mouth 2 (two) times daily. 04/13/21  Yes [provider]  amLODipine (NORVASC) 2.5 MG tablet Take 1 tablet (2.5 mg total) by mouth daily. 08/17/22 08/17/23 Yes Glendale Chard, MD  atorvastatin (LIPITOR) 20 MG tablet Take 20 mg by mouth daily.   Yes [provider]  cetirizine (ZYRTEC) 10 MG tablet Take 10 mg by mouth daily.   Yes [provider]  ELIQUIS 5 MG TABS tablet TAKE 1 TABLET(5 MG) BY MOUTH TWICE DAILY 03/14/22  Yes Patwardhan, Manish J, MD  finasteride (PROSCAR) 5 MG tablet Take 5 mg by mouth daily.   Yes [provider]  fluticasone (FLONASE) 50 MCG/ACT nasal spray SHAKE LIQUID AND USE 1 SPRAY IN EACH NOSTRIL DAILY 07/26/22  Yes Glendale Chard, MD  lenalidomide (REVLIMID) 5 MG capsule Take 5 mg by mouth daily.   Yes [provider]  traMADol (ULTRAM) 50 MG tablet Take 1 tablet (50 mg total) by mouth every 12 (twelve) hours as needed. 04/13/22 04/13/23 Yes Glendale Chard, MD  triamcinolone (NASACORT) 55 MCG/ACT AERO nasal inhaler Place 2 sprays into the nose daily.   Yes [provider]  Misc Natural Products (NEURIVA PO) Take by mouth at bedtime.    [provider]  nitroGLYCERIN (NITROSTAT) 0.4 MG SL tablet Place 1 tablet (0.4 mg total) under the tongue every 5 (five) minutes as needed for chest pain. 03/31/21 08/17/22  Glendale Chard, MD  terazosin (HYTRIN) 10 MG capsule TAKE 1 CAPSULE(10 MG) BY MOUTH AT BEDTIME Patient taking differently: Take 5 mg by mouth at bedtime. 09/12/22   Glendale Chard, MD  Xylitol [facility] MT) Use as directed in the mouth or throat at bedtime.    [provider]    Allergies as of 08/23/2022   (No Known Allergies)    Family History  Problem Relation Age of Onset   Healthy Mother    Prostate cancer Father    Testicular cancer Brother    Prostate cancer Brother    Lung cancer Brother    Cancer Brother        double mynoma   Hypertension Other     Social History   Socioeconomic History   Marital status: Married    Spouse name: Not on file   Number of children: 0   Years of education: Not on file   Highest education level: Not on file  Occupational History   Occupation: retired  Tobacco Use   Smoking status: Former    Packs/day: 0.50    Years: 35.00    Total pack years: 17.50    Types: Cigarettes    Quit date: 12/12/1997    Years since quitting: 24.8   Smokeless tobacco: Never   Tobacco comments:    12 cigs daily  Vaping Use   Vaping Use: Never used  Substance and Sexual Activity   Alcohol use: Yes    Comment: ocasional beer   Drug use: No   Sexual activity: Not Currently  Other Topics Concern   Not on file  Social History Narrative   Not on file   Social Determinants of Health   Financial Resource Strain: Low Risk  (04/06/2022)   Overall Financial Resource Strain (CARDIA)    Difficulty of Paying Living Expenses: Not hard at all  Food Insecurity: No Food Insecurity  (04/06/2022)   Hunger Vital Sign    Worried About Running Out of Food in the Last Year: Never true    [place] in the Last Year: Never true  Transportation Needs: No Transportation Needs (04/06/2022)   PRAPARE - Hydrologist (Medical): No    Lack of Transportation (Non-Medical): No  Physical Activity: Inactive (04/06/2022)   Exercise Vital Sign    Days of Exercise per Week: 0 days    Minutes of Exercise per Session: 0 min  Stress: No Stress Concern Present (04/06/2022)   [place]    Feeling of Stress : Not at all  Social Connections: Not on file  Intimate Partner Violence: Not At Risk (05/08/2019)   Humiliation, Afraid, Rape, and Kick questionnaire    Fear of Current or Ex-Partner: No    Emotionally Abused: No    Physically Abused: No    Sexually Abused: No    Review of Systems: See HPI, otherwise negative ROS  Physical Exam: BP 133/76   Temp 98.2 F (36.8 C) (Tympanic)   Resp 13   Ht _0  (1.905 m)   Wt 102.5 kg   SpO2 99%   BMI 28.25 kg/m  General:   Alert,  pleasant and cooperative in NAD Head:  Normocephalic and atraumatic. Lungs:  Clear to auscultation.    Heart:  Regular rate and rhythm.   Impression/Plan: Edward Gallagher is here for ophthalmic surgery.  Risks, benefits, limitations, and alternatives regarding ophthalmic surgery have been reviewed with the patient.  Questions have been answered.  All parties agreeable.   Leandrew Koyanagi, MD  10/26/2022, 12:41 PM

## 2022-10-27 ENCOUNTER — Encounter: Payer: Self-pay | Admitting: Ophthalmology

## 2022-10-30 ENCOUNTER — Telehealth: Payer: Self-pay | Admitting: Cardiology

## 2022-10-30 DIAGNOSIS — I480 Paroxysmal atrial fibrillation: Secondary | ICD-10-CM

## 2022-10-31 DIAGNOSIS — Z79899 Other long term (current) drug therapy: Secondary | ICD-10-CM | POA: Diagnosis not present

## 2022-10-31 DIAGNOSIS — R200 Anesthesia of skin: Secondary | ICD-10-CM | POA: Diagnosis not present

## 2022-10-31 DIAGNOSIS — Z7901 Long term (current) use of anticoagulants: Secondary | ICD-10-CM | POA: Diagnosis not present

## 2022-10-31 DIAGNOSIS — E785 Hyperlipidemia, unspecified: Secondary | ICD-10-CM | POA: Diagnosis not present

## 2022-10-31 DIAGNOSIS — R600 Localized edema: Secondary | ICD-10-CM | POA: Diagnosis not present

## 2022-10-31 DIAGNOSIS — C9001 Multiple myeloma in remission: Secondary | ICD-10-CM | POA: Diagnosis not present

## 2022-10-31 DIAGNOSIS — Z5112 Encounter for antineoplastic immunotherapy: Secondary | ICD-10-CM | POA: Diagnosis not present

## 2022-10-31 DIAGNOSIS — Z9481 Bone marrow transplant status: Secondary | ICD-10-CM | POA: Diagnosis not present

## 2022-10-31 DIAGNOSIS — M25551 Pain in right hip: Secondary | ICD-10-CM | POA: Diagnosis not present

## 2022-10-31 DIAGNOSIS — K5900 Constipation, unspecified: Secondary | ICD-10-CM | POA: Diagnosis not present

## 2022-10-31 DIAGNOSIS — R202 Paresthesia of skin: Secondary | ICD-10-CM | POA: Diagnosis not present

## 2022-10-31 DIAGNOSIS — Z7961 Long term (current) use of immunomodulator: Secondary | ICD-10-CM | POA: Diagnosis not present

## 2022-10-31 DIAGNOSIS — I4891 Unspecified atrial fibrillation: Secondary | ICD-10-CM | POA: Diagnosis not present

## 2022-10-31 DIAGNOSIS — M25552 Pain in left hip: Secondary | ICD-10-CM | POA: Diagnosis not present

## 2022-10-31 DIAGNOSIS — M7989 Other specified soft tissue disorders: Secondary | ICD-10-CM | POA: Diagnosis not present

## 2022-10-31 DIAGNOSIS — I10 Essential (primary) hypertension: Secondary | ICD-10-CM | POA: Diagnosis not present

## 2022-10-31 DIAGNOSIS — G8929 Other chronic pain: Secondary | ICD-10-CM | POA: Diagnosis not present

## 2022-11-02 ENCOUNTER — Ambulatory Visit (LOCAL_COMMUNITY_HEALTH_CENTER): Payer: Medicare Other

## 2022-11-02 DIAGNOSIS — Z719 Counseling, unspecified: Secondary | ICD-10-CM

## 2022-11-02 DIAGNOSIS — Z23 Encounter for immunization: Secondary | ICD-10-CM | POA: Diagnosis not present

## 2022-11-02 NOTE — Progress Notes (Signed)
  Are you feeling sick today? No   Have you ever received a dose of COVID-19 Vaccine? [employer], [place], [place], [place], Other) Yes  If yes, which vaccine and how many doses?   [place]   Did you bring the vaccination record card or other documentation?  No   Do you have a health condition or are undergoing treatment that makes you moderately or severely immunocompromised? This would include, but not be limited to: cancer, HIV, organ transplant, immunosuppressive therapy/high-dose corticosteroids, or moderate/severe primary immunodeficiency.  Yes  Have you received COVID-19 vaccine before or during hematopoietic cell transplant (HCT) or CAR-T-cell therapies? No  Have you ever had an allergic reaction to: (This would include a severe allergic reaction or a reaction that caused hives, swelling, or respiratory distress, including wheezing.) A component of a COVID-19 vaccine or a previous dose of COVID-19 vaccine? No   Have you ever had an allergic reaction to another vaccine (other thanCOVID-19 vaccine) or an injectable medication? (This would include a severe allergic reaction or a reaction that caused hives, swelling, or respiratory distress, including wheezing.)   No    Do you have a history of any of the following:  Myocarditis or Pericarditis No  Dermal fillers:  No  Multisystem Inflammatory Syndrome (MIS-C or MIS-A)? No  COVID-19 disease within the past 3 months? No  Vaccinated with monkeypox vaccine in the last 4 weeks? No  In nurse clinic for Covid vaccine. Tolerated well to R. Delt. Stayed for observation 21mns . No adverse reactions. Documented in [facility] and copy given to pt.

## 2022-11-11 ENCOUNTER — Ambulatory Visit: Payer: Medicare Other | Admitting: Cardiology

## 2022-11-11 ENCOUNTER — Encounter: Payer: Self-pay | Admitting: Cardiology

## 2022-11-11 VITALS — BP 126/62 | HR 75 | Resp 18 | Ht 75.0 in | Wt 228.4 lb

## 2022-11-11 DIAGNOSIS — I10 Essential (primary) hypertension: Secondary | ICD-10-CM

## 2022-11-11 DIAGNOSIS — R931 Abnormal findings on diagnostic imaging of heart and coronary circulation: Secondary | ICD-10-CM | POA: Diagnosis not present

## 2022-11-11 DIAGNOSIS — I480 Paroxysmal atrial fibrillation: Secondary | ICD-10-CM | POA: Diagnosis not present

## 2022-11-11 DIAGNOSIS — E782 Mixed hyperlipidemia: Secondary | ICD-10-CM | POA: Diagnosis not present

## 2022-11-11 MED ORDER — ATORVASTATIN CALCIUM 40 MG PO TABS: 40.0000 mg | ORAL_TABLET | Freq: Every day | ORAL | 3 refills | Status: DC

## 2022-11-11 NOTE — Progress Notes (Signed)
Follow up visit  Subjective:   Edward Gallagher, male    DOB: [birth year], 81 y.o.   MRN: [id number]   HPI  81 y.o. African American male with hypertension, hyperlipidemia, OSA, paroxysmal Afib, multiple myeloma in remission  Patient is doing well.  He has no complaints today. Reviewed recent test results with the patient, details below.  He has noticed leg edema since starting amlodipine.     Current Outpatient Medications:    acyclovir (ZOVIRAX) 400 MG tablet, Take 400 mg by mouth 2 (two) times daily., Disp: , Rfl:    amLODipine (NORVASC) 2.5 MG tablet, Take 1 tablet (2.5 mg total) by mouth daily., Disp: 30 tablet, Rfl: 11   atorvastatin (LIPITOR) 20 MG tablet, Take 20 mg by mouth daily., Disp: , Rfl:    cetirizine (ZYRTEC) 10 MG tablet, Take 10 mg by mouth daily., Disp: , Rfl:    ELIQUIS 5 MG TABS tablet, TAKE 1 TABLET BY MOUTH TWICE  DAILY, Disp: 200 tablet, Rfl: 2   finasteride (PROSCAR) 5 MG tablet, Take 5 mg by mouth daily., Disp: , Rfl:    fluticasone (FLONASE) 50 MCG/ACT nasal spray, SHAKE LIQUID AND USE 1 SPRAY IN EACH NOSTRIL DAILY, Disp: 16 g, Rfl: 2   lenalidomide (REVLIMID) 5 MG capsule, Take 5 mg by mouth daily., Disp: , Rfl:    Misc Natural Products (NEURIVA PO), Take by mouth at bedtime., Disp: , Rfl:    nitroGLYCERIN (NITROSTAT) 0.4 MG SL tablet, Place 1 tablet (0.4 mg total) under the tongue every 5 (five) minutes as needed for chest pain., Disp: 100 tablet, Rfl: 3   terazosin (HYTRIN) 10 MG capsule, TAKE 1 CAPSULE(10 MG) BY MOUTH AT BEDTIME (Patient taking differently: Take 5 mg by mouth at bedtime.), Disp: 90 capsule, Rfl: 1   traMADol (ULTRAM) 50 MG tablet, Take 1 tablet (50 mg total) by mouth every 12 (twelve) hours as needed., Disp: 30 tablet, Rfl: 0   triamcinolone (NASACORT) 55 MCG/ACT AERO nasal inhaler, Place 2 sprays into the nose daily., Disp: , Rfl:    Xylitol (XYLIMELTS MT), Use as directed in the mouth or throat at bedtime., Disp: , Rfl:    Cardiovascular & other pertient studies:  EKG 11/11/2022: Sinus rhythm 61 bpm First degree A-V block  Frequent PACs    Echocardiogram 05/30/2021:  1. Left ventricular ejection fraction, by estimation, is 55 to 60%. The  left ventricle has normal function. The left ventricle has no regional  wall motion abnormalities. Left ventricular diastolic parameters were  normal.   2. Right ventricular systolic function is normal. The right ventricular  size is normal.   3. The mitral valve is normal in structure. No evidence of mitral valve  regurgitation.   4. The aortic valve is normal in structure. Aortic valve regurgitation is  not visualized.   EKG 05/21/2021: Sinus rhythm 58 bpm Normal EKG  Calcium score 01/2020: - Total Calcium Score: 38. -- Left Main: 29. -- Right Coronary: 0 -- Left Anterior Descending: 9. -- Circumflex: 0.  Lexiscan (Walking with mod Bruce)Tetrofosmin Stress Test  12/25/2019: Nondiagnostic ECG stress. Normal myocardial perfusion. Stress LV EF is mildly depressed at 42% with global hypokinesis.  No previous exam available for comparison. 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 Correlate with echocardiogram. Intermediate risk study due to low LVEF.   Abdominal Aortic Duplex  01/21/2020:  The maximum aorta (sac) diameter is 2.35 cm (prox). Diffuse calcific  plaque observed in the proximal, mid and distal aorta. Normal  flow  velocities noted.  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  Normal flow velocity in the aorta and bilateral IIA.  Consider rescan in 10 years.   Carotid artery duplex  11/15/2019: Minimal stenosis in the right internal carotid artery (minimal). Minimal stenosis in the left internal carotid artery (minimal). Mild soft plaque bilateral carotid arteries. Antegrade right vertebral artery flow. Antegrade left vertebral artery flow.  Event monitor 11/02/2019 - 12/01/2019: Diagnostic time:  100%  Dominant rhythm: Sinus. HR 36-136 bpm. Avg HR 64 bpm. Episodes of atrial fibrillation with RVR noted. Afib burden <1% NSVT up to 4 beats noted.  Sinus bradycardia with lowest HR 36 bpm, typically occurs during sleep hours.  Occasional PAC/PVC seen. No atrial flutter/SVT/high grade AV block, sinus pause >3sec noted. Symptoms reported: None   Recent labs: 08/17/2022: Chol 147, TG 36, HDL 55, LDL 83  11/24/2021: Chol 230, TG 90, HDL 44, LDL 170  05/30/2021: Glucose 114, BUN/Cr 24/1.24. EGFR 59. Na/K 138/3.5.  H/H 7.5/24.5. MCV 80. Platelets 118 TSH 2.7 normal  02/2019: Chol 157, TG 75, HDL 52, LDL 91   Review of Systems  Cardiovascular:  Negative for chest pain, dyspnea on exertion, leg swelling (Improved with compression stockings), palpitations and syncope.         Vitals:   11/11/22 1435  BP: 126/62  Pulse: 75  Resp: 18  SpO2: 96%     Objective:   Physical Exam Vitals and nursing note reviewed.  Constitutional:      General: He is not in acute distress. Neck:     Vascular: No JVD.  Cardiovascular:     Rate and Rhythm: Normal rate and regular rhythm.     Heart sounds: Normal heart sounds. No murmur heard. Pulmonary:     Effort: Pulmonary effort is normal.     Breath sounds: Normal breath sounds. No wheezing or rales.  Musculoskeletal:     Right lower leg: Edema (1+) present.     Left lower leg: Edema (1+) present.         Assessment & Recommendations:   81 y.o. African American male with hypertension, hyperlipidemia, OSA, paroxysmal Afib, multiple myeloma in remission   Paroxysmal Afib: Afib burden <1%. Resting HR during sleep in 30s. Given symptoms are infrequent, would avoid scheduled AV nodal blocking agent or antiarrythmic therapy. No ischemia on stress test. Calcium score 29 (01/2020). Emphasized the use of CPAP.  CHA2DS2VASc score 3, annual stroke risk 3.2%. 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  No recent GI bleeding.  Tolerating  apixaban.    Mixed hyperlipidemia, Elevated calcium score: LDL down to 83 on Lipitor 20 mg daily. Increase further to 40 mg daily for further LDL reduction.  Hypertension:  Controlled H/o AKI with chlorthalidone, syncope with Bidil. Given leg edema, discontinued amlodipine 2.5 mg daily.  I am optimistic that his blood pressure will remain optimal without amlodipine.  If it does not, could consider adding labetalol.  Aorta ectasia: Mild  F/u in 3 months   Nigel Mormon, MD Pager: [phone] Office: [phone]

## 2022-11-16 ENCOUNTER — Ambulatory Visit: Payer: Medicare Other | Admitting: Cardiology

## 2022-11-25 NOTE — Telephone Encounter (Signed)
Patient says per last appointment, he is supposed to call back 2 weeks later to share some information. He had swelling in his right ankle for 4 out of the past 14 days. Please advise.

## 2022-11-25 NOTE — Telephone Encounter (Signed)
Spoke with the patient, You do not need to call.  Leg swelling, SBP only seldom >140 mmHg without amlodipine. Conitnue monitoring for now. Has f/u w/PCP Dr. Baird Cancer next month. If BP remains elevated, could add labetalol.

## 2022-12-04 ENCOUNTER — Other Ambulatory Visit: Payer: Self-pay | Admitting: Urology

## 2022-12-06 DIAGNOSIS — K5900 Constipation, unspecified: Secondary | ICD-10-CM | POA: Diagnosis not present

## 2022-12-06 DIAGNOSIS — Z23 Encounter for immunization: Secondary | ICD-10-CM | POA: Diagnosis not present

## 2022-12-06 DIAGNOSIS — R531 Weakness: Secondary | ICD-10-CM | POA: Diagnosis not present

## 2022-12-06 DIAGNOSIS — Z5112 Encounter for antineoplastic immunotherapy: Secondary | ICD-10-CM | POA: Diagnosis not present

## 2022-12-06 DIAGNOSIS — I10 Essential (primary) hypertension: Secondary | ICD-10-CM | POA: Diagnosis not present

## 2022-12-06 DIAGNOSIS — E785 Hyperlipidemia, unspecified: Secondary | ICD-10-CM | POA: Diagnosis not present

## 2022-12-06 DIAGNOSIS — G8929 Other chronic pain: Secondary | ICD-10-CM | POA: Diagnosis not present

## 2022-12-06 DIAGNOSIS — I4891 Unspecified atrial fibrillation: Secondary | ICD-10-CM | POA: Diagnosis not present

## 2022-12-06 DIAGNOSIS — M25551 Pain in right hip: Secondary | ICD-10-CM | POA: Diagnosis not present

## 2022-12-06 DIAGNOSIS — M25552 Pain in left hip: Secondary | ICD-10-CM | POA: Diagnosis not present

## 2022-12-06 DIAGNOSIS — C9001 Multiple myeloma in remission: Secondary | ICD-10-CM | POA: Diagnosis not present

## 2022-12-06 DIAGNOSIS — Z9481 Bone marrow transplant status: Secondary | ICD-10-CM | POA: Diagnosis not present

## 2022-12-06 LAB — HEPATIC FUNCTION PANEL
ALT: 19 U/L (ref 10–40)
AST: 19 (ref 14–40)
Alkaline Phosphatase: 71 (ref 25–125)
Bilirubin, Total: 0.8

## 2022-12-06 LAB — CBC AND DIFFERENTIAL
Hemoglobin: 10.1 — AB (ref 13.5–17.5)
Platelets: 130 10*3/uL — AB (ref 150–400)
WBC: 2.6

## 2022-12-06 LAB — COMPREHENSIVE METABOLIC PANEL
Albumin: 3.6 (ref 3.5–5.0)
Calcium: 8.6 — AB (ref 8.7–10.7)

## 2022-12-06 LAB — BASIC METABOLIC PANEL
BUN: 20 (ref 4–21)
Chloride: 109 — AB (ref 99–108)
Creatinine: 1.4 — AB (ref 0.6–1.3)
Glucose: 100
Potassium: 4.2 mEq/L (ref 3.5–5.1)
Sodium: 141 (ref 137–147)

## 2022-12-07 ENCOUNTER — Encounter: Payer: Self-pay | Admitting: Internal Medicine

## 2022-12-07 ENCOUNTER — Telehealth: Payer: Self-pay

## 2022-12-07 ENCOUNTER — Other Ambulatory Visit: Payer: Self-pay | Admitting: Internal Medicine

## 2022-12-07 DIAGNOSIS — E782 Mixed hyperlipidemia: Secondary | ICD-10-CM

## 2022-12-07 NOTE — Telephone Encounter (Signed)
Patient sent a mychart message saying his bp has been running high since being taken off of his amlodipine by the cardiologist and was seeking advice from Dr.Sanders. I advised pt Dr.Sanders is currently out of the office until next week he should reach out to his cardiologist regarding his bp he then said Dr.Patwardhan advised him to wait until his appt on 1/11 with Dr.Sanders to discuss his bp. After reviewing Dr.Patwardhan's OV note it says that if the patient's bp does not stay controlled he is consider added labetalol. I called pt and advised him to call his cardiologist and informed him they may give me labetalol per OV Note. Pt still declined to contact cardiologist and said he will wait til appt on 12/22/22. YL,RMA

## 2022-12-08 ENCOUNTER — Telehealth: Payer: Self-pay

## 2022-12-08 NOTE — Telephone Encounter (Signed)
Patient called and is still having swelling in both ankles, he asked if there was something you could give him?

## 2022-12-12 NOTE — Telephone Encounter (Signed)
Could try very low dose lasix 10 mg daily. He had kidney issues and syncope with chlorthalidone in the past. Please send lasix 10 mg daily for 30 pillsX3 refills. Also suggets using compression stockings.  Thanks MJP

## 2022-12-13 ENCOUNTER — Other Ambulatory Visit: Payer: Self-pay

## 2022-12-13 MED ORDER — FUROSEMIDE 20 MG PO TABS: 20.0000 mg | ORAL_TABLET | Freq: Every day | ORAL | 3 refills | Status: DC

## 2022-12-13 NOTE — Telephone Encounter (Signed)
Half of 20

## 2022-12-13 NOTE — Telephone Encounter (Signed)
It doesn't come in 10 can he do 20 or half of '20mg'$ ?

## 2022-12-22 ENCOUNTER — Encounter: Payer: Self-pay | Admitting: Internal Medicine

## 2022-12-22 ENCOUNTER — Ambulatory Visit (INDEPENDENT_AMBULATORY_CARE_PROVIDER_SITE_OTHER): Payer: Medicare Other | Admitting: Internal Medicine

## 2022-12-22 VITALS — BP 132/70 | HR 74 | Temp 98.2°F | Ht 75.0 in | Wt 229.4 lb

## 2022-12-22 DIAGNOSIS — Z0000 Encounter for general adult medical examination without abnormal findings: Secondary | ICD-10-CM | POA: Diagnosis not present

## 2022-12-22 DIAGNOSIS — E7800 Pure hypercholesterolemia, unspecified: Secondary | ICD-10-CM | POA: Diagnosis not present

## 2022-12-22 DIAGNOSIS — Z6828 Body mass index (BMI) 28.0-28.9, adult: Secondary | ICD-10-CM | POA: Diagnosis not present

## 2022-12-22 DIAGNOSIS — N1831 Chronic kidney disease, stage 3a: Secondary | ICD-10-CM | POA: Diagnosis not present

## 2022-12-22 DIAGNOSIS — I129 Hypertensive chronic kidney disease with stage 1 through stage 4 chronic kidney disease, or unspecified chronic kidney disease: Secondary | ICD-10-CM

## 2022-12-22 LAB — POCT URINALYSIS DIPSTICK
Bilirubin, UA: NEGATIVE
Blood, UA: NEGATIVE
Glucose, UA: NEGATIVE
Ketones, UA: NEGATIVE
Leukocytes, UA: NEGATIVE
Nitrite, UA: NEGATIVE
Protein, UA: POSITIVE — AB
Spec Grav, UA: 1.02 (ref 1.010–1.025)
Urobilinogen, UA: 1 E.U./dL
pH, UA: 6.5 (ref 5.0–8.0)

## 2022-12-22 NOTE — Progress Notes (Signed)
Rich Brave [place],acting as a Education administrator for Maximino Greenland, MD.,have documented all relevant documentation on the behalf of Maximino Greenland, MD,as directed by  Maximino Greenland, MD while in the presence of Maximino Greenland, MD.   Subjective:     Patient ID: Edward Gallagher , male    DOB: 1941-11-05 , 82 y.o.   MRN: [id number]   Chief Complaint  Patient presents with   Annual Exam   Hypertension    HPI  He is here today for a full physical examination. He is followed by Urology for his prostate exams. He has no specific concerns at this time.  He denies headaches, chest pain and visual disturbances. He is under active treatment plan at [facility] for multiple myeloma.    Hypertension This is a chronic problem. The current episode started more than 1 year ago. The problem has been gradually improving since onset. The problem is uncontrolled. Pertinent negatives include no blurred vision. Risk factors for coronary artery disease include male gender. Past treatments include lifestyle changes and diuretics. The current treatment provides moderate improvement.     Past Medical History:  Diagnosis Date   A-fib [facility])    Allergy    Arthritis    Hyperlipidemia    Hypertension    Multiple myeloma (HCC)    Prostatic hypertrophy    Sinus trouble    Sleep apnea    CPAP   Wears dentures    full upper, partial lower     Family History  Problem Relation Age of Onset   Healthy Mother    Prostate cancer Father    Testicular cancer Brother    Prostate cancer Brother    Lung cancer Brother    Cancer Brother        double mynoma   Hypertension Other      Current Outpatient Medications:    acyclovir (ZOVIRAX) 400 MG tablet, Take 400 mg by mouth 2 (two) times daily., Disp: , Rfl:    atorvastatin (LIPITOR) 40 MG tablet, Take 1 tablet (40 mg total) by mouth daily., Disp: 90 tablet, Rfl: 3   cetirizine (ZYRTEC) 10 MG tablet, Take 10 mg by mouth daily., Disp: , Rfl:    ELIQUIS 5 MG TABS tablet,  TAKE 1 TABLET BY MOUTH TWICE  DAILY, Disp: 200 tablet, Rfl: 2   finasteride (PROSCAR) 5 MG tablet, Take 5 mg by mouth daily., Disp: , Rfl:    fluticasone (FLONASE) 50 MCG/ACT nasal spray, SHAKE LIQUID AND USE 1 SPRAY IN EACH NOSTRIL DAILY, Disp: 16 g, Rfl: 2   furosemide (LASIX) 20 MG tablet, Take 1 tablet (20 mg total) by mouth daily., Disp: 30 tablet, Rfl: 3   lenalidomide (REVLIMID) 5 MG capsule, Take 5 mg by mouth daily., Disp: , Rfl:    Misc Natural Products (NEURIVA PO), Take by mouth at bedtime., Disp: , Rfl:    terazosin (HYTRIN) 10 MG capsule, TAKE 1 CAPSULE(10 MG) BY MOUTH AT BEDTIME (Patient taking differently: Take 5 mg by mouth at bedtime.), Disp: 90 capsule, Rfl: 1   traMADol (ULTRAM) 50 MG tablet, Take 1 tablet (50 mg total) by mouth every 12 (twelve) hours as needed., Disp: 30 tablet, Rfl: 0   triamcinolone (NASACORT) 55 MCG/ACT AERO nasal inhaler, Place 2 sprays into the nose daily., Disp: , Rfl:    Xylitol (XYLIMELTS MT), Use as directed in the mouth or throat at bedtime., Disp: , Rfl:    amLODipine (NORVASC) 2.5 MG tablet, Take 2.5 mg  by mouth every other day., Disp: , Rfl:    nitroGLYCERIN (NITROSTAT) 0.4 MG SL tablet, Place 1 tablet (0.4 mg total) under the tongue every 5 (five) minutes as needed for chest pain., Disp: 100 tablet, Rfl: 3   No Known Allergies   Men's preventive visit. Patient Health Questionnaire (PHQ-2) is  Flowsheet Row Clinical Support from 04/06/2022 in [facility]  PHQ-2 Total Score 0     . Patient is on a healthy diet. Marital status: Married. Relevant history for alcohol use is:  Social History   Substance and Sexual Activity  Alcohol Use Not Currently   Comment: ocasional beer  . Relevant history for tobacco use is:  Social History   Tobacco Use  Smoking Status Former   Packs/day: 0.50   Years: 35.00   Total pack years: 17.50   Types: Cigarettes   Quit date: 12/12/1997   Years since quitting: 25.0   Smokeless Tobacco Never  Tobacco Comments   12 cigs daily  .   Review of Systems  Constitutional: Negative.   HENT: Negative.    Eyes: Negative.  Negative for blurred vision.  Respiratory: Negative.    Cardiovascular: Negative.        He is concerned about his blood pressure readings. Last month, his cardiologist took him off of amlodipine due to RLE swelling. He has noticed his BP are increasing. He is not sure what to do next.   Gastrointestinal: Negative.   Endocrine: Negative.   Genitourinary: Negative.   Musculoskeletal: Negative.   Skin: Negative.   Neurological: Negative.   Hematological: Negative.   Psychiatric/Behavioral: Negative.       Today's Vitals   12/22/22 0958  BP: 132/70  Pulse: 74  Temp: 98.2 F (36.8 C)  Weight: 229 lb 6.4 oz (104.1 kg)  Height: '6\' 3"'$  (1.905 m)  PainSc: 0-No pain   Body mass index is 28.67 kg/m.  Wt Readings from Last 3 Encounters:  12/22/22 229 lb 6.4 oz (104.1 kg)  11/11/22 228 lb 6.4 oz (103.6 kg)  10/26/22 226 lb (102.5 kg)     Objective:  Physical Exam Vitals and nursing note reviewed.  Constitutional:      Appearance: Normal appearance.  HENT:     Head: Normocephalic and atraumatic.     Right Ear: Tympanic membrane, ear canal and external ear normal.     Left Ear: Tympanic membrane, ear canal and external ear normal.     Nose:     Comments: Masked     Mouth/Throat:     Comments: Masked  Eyes:     Extraocular Movements: Extraocular movements intact.     Conjunctiva/sclera: Conjunctivae normal.     Pupils: Pupils are equal, round, and reactive to light.  Cardiovascular:     Rate and Rhythm: Normal rate and regular rhythm.     Pulses: Normal pulses.     Heart sounds: Normal heart sounds.  Pulmonary:     Effort: Pulmonary effort is normal.     Breath sounds: Normal breath sounds.  Chest:  Breasts:    Right: Normal. No swelling, bleeding, inverted nipple, mass or nipple discharge.     Left: Normal. No swelling,  bleeding, inverted nipple, mass or nipple discharge.  Abdominal:     General: Abdomen is flat. Bowel sounds are normal.     Palpations: Abdomen is soft.  Genitourinary:    Comments: deferred Musculoskeletal:        General: Normal range of motion.  Cervical back: Normal range of motion and neck supple.  Skin:    General: Skin is warm.  Neurological:     General: No focal deficit present.     Mental Status: He is alert.  Psychiatric:        Mood and Affect: Mood normal.        Behavior: Behavior normal.      Assessment And Plan:    1. Encounter for general adult medical examination w/o abnormal findings Comments: A full exam was performed, excluding DRE.  PATIENT IS ADVISED TO GET 30-45 MINUTES REGULAR EXERCISE NO LESS THAN FOUR TO FIVE DAYS PER WEEK - BOTH WEIGHTBEARING EXERCISES AND AEROBIC ARE RECOMMENDED.  PATIENT IS ADVISED TO FOLLOW A HEALTHY DIET WITH AT LEAST SIX FRUITS/VEGGIES PER DAY, DECREASE INTAKE OF RED MEAT, AND TO INCREASE FISH INTAKE TO TWO DAYS PER WEEK.  MEATS/FISH SHOULD NOT BE FRIED, BAKED OR BROILED IS PREFERABLE.  IT IS ALSO IMPORTANT TO CUT BACK ON YOUR SUGAR INTAKE. PLEASE AVOID ANYTHING WITH ADDED SUGAR, CORN SYRUP OR OTHER SWEETENERS. IF YOU MUST USE A SWEETENER, YOU CAN TRY STEVIA. IT IS ALSO IMPORTANT TO AVOID ARTIFICIALLY SWEETENERS AND DIET BEVERAGES. LASTLY, I SUGGEST WEARING SPF 50 SUNSCREEN ON EXPOSED PARTS AND ESPECIALLY WHEN IN THE DIRECT SUNLIGHT FOR AN EXTENDED PERIOD OF TIME.  PLEASE AVOID FAST FOOD RESTAURANTS AND INCREASE YOUR WATER INTAKE.  2. Hypertensive nephropathy Comments: Fair control. He will continue to keep BP log. If persistently elevated, may need to resume amlodipine, but with different dosing schedule.  He will send in his BP readings via Mychart. He is encouraged to continue with a low sodium diet.  - POCT Urinalysis Dipstick (81002) - Microalbumin / Creatinine Urine Ratio - Lipid panel  3. Stage 3a chronic kidney disease  (HCC) Comments: Chronic, he should avoid NSAIDs, stay well hydrated and keep BP well controlled to decrease risk of CKD progression.  4. Pure hypercholesterolemia Comments: Chronic, he is currently on atorvastatin '40mg'$  daily. - Lipid panel  5. BMI 28.0-28.9,adult Comments: He is encouraged to aim for at least 150 minutes of exercise/week as tolerated.  Patient was given opportunity to ask questions. Patient verbalized understanding of the plan and was able to repeat key elements of the plan. All questions were answered to their satisfaction.   I, Maximino Greenland, MD, have reviewed all documentation for this visit. The documentation on 12/22/22 for the exam, diagnosis, procedures, and orders are all accurate and complete.   THE PATIENT IS ENCOURAGED TO PRACTICE SOCIAL DISTANCING DUE TO THE COVID-19 PANDEMIC.

## 2022-12-22 NOTE — Patient Instructions (Signed)
Health Maintenance, Male Adopting a healthy lifestyle and getting preventive care are important in promoting health and wellness. Ask your health care provider about: The right schedule for you to have regular tests and exams. Things you can do on your own to prevent diseases and keep yourself healthy. What should I know about diet, weight, and exercise? Eat a healthy diet  Eat a diet that includes plenty of vegetables, fruits, low-fat dairy products, and lean protein. Do not eat a lot of foods that are high in solid fats, added sugars, or sodium. Maintain a healthy weight Body mass index (BMI) is a measurement that can be used to identify possible weight problems. It estimates body fat based on height and weight. Your health care provider can help determine your BMI and help you achieve or maintain a healthy weight. Get regular exercise Get regular exercise. This is one of the most important things you can do for your health. Most adults should: Exercise for at least 150 minutes each week. The exercise should increase your heart rate and make you sweat (moderate-intensity exercise). Do strengthening exercises at least twice a week. This is in addition to the moderate-intensity exercise. Spend less time sitting. Even light physical activity can be beneficial. Watch cholesterol and blood lipids Have your blood tested for lipids and cholesterol at 81 years of age, then have this test every 5 years. You may need to have your cholesterol levels checked more often if: Your lipid or cholesterol levels are high. You are older than 81 years of age. You are at high risk for heart disease. What should I know about cancer screening? Many types of cancers can be detected early and may often be prevented. Depending on your health history and family history, you may need to have cancer screening at various ages. This may include screening for: Colorectal cancer. Prostate cancer. Skin cancer. Lung  cancer. What should I know about heart disease, diabetes, and high blood pressure? Blood pressure and heart disease High blood pressure causes heart disease and increases the risk of stroke. This is more likely to develop in people who have high blood pressure readings or are overweight. Talk with your health care provider about your target blood pressure readings. Have your blood pressure checked: Every 3-5 years if you are 18-39 years of age. Every year if you are 40 years old or older. If you are between the ages of 65 and 75 and are a current or former smoker, ask your health care provider if you should have a one-time screening for abdominal aortic aneurysm (AAA). Diabetes Have regular diabetes screenings. This checks your fasting blood sugar level. Have the screening done: Once every three years after age 45 if you are at a normal weight and have a low risk for diabetes. More often and at a younger age if you are overweight or have a high risk for diabetes. What should I know about preventing infection? Hepatitis B If you have a higher risk for hepatitis B, you should be screened for this virus. Talk with your health care provider to find out if you are at risk for hepatitis B infection. Hepatitis C Blood testing is recommended for: Everyone born from 1945 through 1965. Anyone with known risk factors for hepatitis C. Sexually transmitted infections (STIs) You should be screened each year for STIs, including gonorrhea and chlamydia, if: You are sexually active and are younger than 81 years of age. You are older than 81 years of age and your   health care provider tells you that you are at risk for this type of infection. Your sexual activity has changed since you were last screened, and you are at increased risk for chlamydia or gonorrhea. Ask your health care provider if you are at risk. Ask your health care provider about whether you are at high risk for HIV. Your health care provider  may recommend a prescription medicine to help prevent HIV infection. If you choose to take medicine to prevent HIV, you should first get tested for HIV. You should then be tested every 3 months for as long as you are taking the medicine. Follow these instructions at home: Alcohol use Do not drink alcohol if your health care provider tells you not to drink. If you drink alcohol: Limit how much you have to 0-2 drinks a day. Know how much alcohol is in your drink. In the [place] one drink equals one 12 oz bottle of beer (355 mL), one 5 oz glass of wine (148 mL), or one 1 oz glass of hard liquor (44 mL). Lifestyle Do not use any products that contain nicotine or tobacco. These products include cigarettes, chewing tobacco, and vaping devices, such as e-cigarettes. If you need help quitting, ask your health care provider. Do not use street drugs. Do not share needles. Ask your health care provider for help if you need support or information about quitting drugs. General instructions Schedule regular health, dental, and eye exams. Stay current with your vaccines. Tell your health care provider if: You often feel depressed. You have ever been abused or do not feel safe at home. Summary Adopting a healthy lifestyle and getting preventive care are important in promoting health and wellness. Follow your health care provider's instructions about healthy diet, exercising, and getting tested or screened for diseases. Follow your health care provider's instructions on monitoring your cholesterol and blood pressure. This information is not intended to replace advice given to you by your health care provider. Make sure you discuss any questions you have with your health care provider. Document Revised: 04/19/2021 Document Reviewed: 04/19/2021 Elsevier Patient Education  2023 [employer]  

## 2022-12-23 LAB — LIPID PANEL
Chol/HDL Ratio: 2.4 ratio (ref 0.0–5.0)
Cholesterol, Total: 116 mg/dL (ref 100–199)
HDL: 48 mg/dL (ref >39)
LDL Chol Calc (NIH): 54 mg/dL (ref 0–99)
Triglycerides: 65 mg/dL (ref 0–149)
VLDL Cholesterol Cal: 14 mg/dL (ref 5–40)

## 2022-12-23 LAB — MICROALBUMIN / CREATININE URINE RATIO
Creatinine, Urine: 185.8 mg/dL
Microalb/Creat Ratio: 6 mg/g creat (ref 0–29)
Microalbumin, Urine: 11.4 ug/mL

## 2022-12-30 ENCOUNTER — Encounter: Payer: Self-pay | Admitting: Internal Medicine

## 2023-01-07 ENCOUNTER — Encounter: Payer: Self-pay | Admitting: Internal Medicine

## 2023-01-09 DIAGNOSIS — Z9181 History of falling: Secondary | ICD-10-CM | POA: Diagnosis not present

## 2023-01-09 DIAGNOSIS — Z9481 Bone marrow transplant status: Secondary | ICD-10-CM | POA: Diagnosis not present

## 2023-01-09 DIAGNOSIS — Z5112 Encounter for antineoplastic immunotherapy: Secondary | ICD-10-CM | POA: Diagnosis not present

## 2023-01-09 DIAGNOSIS — C9000 Multiple myeloma not having achieved remission: Secondary | ICD-10-CM | POA: Diagnosis not present

## 2023-01-09 DIAGNOSIS — K5903 Drug induced constipation: Secondary | ICD-10-CM | POA: Diagnosis not present

## 2023-01-09 DIAGNOSIS — I4891 Unspecified atrial fibrillation: Secondary | ICD-10-CM | POA: Diagnosis not present

## 2023-01-09 DIAGNOSIS — I10 Essential (primary) hypertension: Secondary | ICD-10-CM | POA: Diagnosis not present

## 2023-01-09 DIAGNOSIS — M25552 Pain in left hip: Secondary | ICD-10-CM | POA: Diagnosis not present

## 2023-01-09 DIAGNOSIS — G8929 Other chronic pain: Secondary | ICD-10-CM | POA: Diagnosis not present

## 2023-01-09 DIAGNOSIS — C9001 Multiple myeloma in remission: Secondary | ICD-10-CM | POA: Diagnosis not present

## 2023-01-09 DIAGNOSIS — M25551 Pain in right hip: Secondary | ICD-10-CM | POA: Diagnosis not present

## 2023-01-27 ENCOUNTER — Other Ambulatory Visit: Payer: Self-pay | Admitting: Internal Medicine

## 2023-01-27 DIAGNOSIS — E782 Mixed hyperlipidemia: Secondary | ICD-10-CM

## 2023-01-30 ENCOUNTER — Other Ambulatory Visit: Payer: Self-pay

## 2023-01-30 DIAGNOSIS — E782 Mixed hyperlipidemia: Secondary | ICD-10-CM

## 2023-01-30 MED ORDER — ATORVASTATIN CALCIUM 40 MG PO TABS: 40.0000 mg | ORAL_TABLET | Freq: Every day | ORAL | 3 refills | Status: DC

## 2023-02-01 ENCOUNTER — Other Ambulatory Visit: Payer: Self-pay

## 2023-02-09 DIAGNOSIS — C9001 Multiple myeloma in remission: Secondary | ICD-10-CM | POA: Diagnosis not present

## 2023-02-09 DIAGNOSIS — M25552 Pain in left hip: Secondary | ICD-10-CM | POA: Diagnosis not present

## 2023-02-10 ENCOUNTER — Ambulatory Visit: Payer: Medicare Other | Admitting: Cardiology

## 2023-02-10 ENCOUNTER — Encounter: Payer: Self-pay | Admitting: Cardiology

## 2023-02-10 VITALS — BP 131/57 | HR 71 | Resp 16 | Ht 75.0 in | Wt 230.0 lb

## 2023-02-10 DIAGNOSIS — E782 Mixed hyperlipidemia: Secondary | ICD-10-CM | POA: Diagnosis not present

## 2023-02-10 DIAGNOSIS — I480 Paroxysmal atrial fibrillation: Secondary | ICD-10-CM

## 2023-02-10 DIAGNOSIS — I10 Essential (primary) hypertension: Secondary | ICD-10-CM | POA: Diagnosis not present

## 2023-02-10 MED ORDER — APIXABAN 5 MG PO TABS: 5.0000 mg | ORAL_TABLET | Freq: Two times a day (BID) | ORAL | 3 refills | Status: DC

## 2023-02-10 NOTE — Progress Notes (Signed)
Follow up visit  Subjective:   Edward Gallagher, male    DOB: 03/05/1941, 82 y.o.   MRN: [id number]   HPI  82 y.o. African American male with hypertension, hyperlipidemia, OSA, paroxysmal Afib, multiple myeloma in remission  Patient is doing well.  He has no complaints today. Reviewed recent test results with the patient, details below.  He has noticed leg edema since starting amlodipine.     Current Outpatient Medications:    acyclovir (ZOVIRAX) 400 MG tablet, Take 400 mg by mouth 2 (two) times daily., Disp: , Rfl:    amLODipine (NORVASC) 2.5 MG tablet, Take 2.5 mg by mouth every other day., Disp: , Rfl:    atorvastatin (LIPITOR) 40 MG tablet, Take 1 tablet (40 mg total) by mouth daily., Disp: 90 tablet, Rfl: 3   cetirizine (ZYRTEC) 10 MG tablet, Take 10 mg by mouth daily., Disp: , Rfl:    ELIQUIS 5 MG TABS tablet, TAKE 1 TABLET BY MOUTH TWICE  DAILY, Disp: 200 tablet, Rfl: 2   finasteride (PROSCAR) 5 MG tablet, Take 5 mg by mouth daily., Disp: , Rfl:    fluticasone (FLONASE) 50 MCG/ACT nasal spray, SHAKE LIQUID AND USE 1 SPRAY IN EACH NOSTRIL DAILY, Disp: 16 g, Rfl: 2   furosemide (LASIX) 20 MG tablet, Take 1 tablet (20 mg total) by mouth daily., Disp: 30 tablet, Rfl: 3   lenalidomide (REVLIMID) 5 MG capsule, Take 5 mg by mouth daily., Disp: , Rfl:    Misc Natural Products (NEURIVA PO), Take by mouth at bedtime., Disp: , Rfl:    nitroGLYCERIN (NITROSTAT) 0.4 MG SL tablet, Place 1 tablet (0.4 mg total) under the tongue every 5 (five) minutes as needed for chest pain., Disp: 100 tablet, Rfl: 3   terazosin (HYTRIN) 10 MG capsule, TAKE 1 CAPSULE(10 MG) BY MOUTH AT BEDTIME (Patient taking differently: Take 5 mg by mouth at bedtime.), Disp: 90 capsule, Rfl: 1   traMADol (ULTRAM) 50 MG tablet, Take 1 tablet (50 mg total) by mouth every 12 (twelve) hours as needed., Disp: 30 tablet, Rfl: 0   triamcinolone (NASACORT) 55 MCG/ACT AERO nasal inhaler, Place 2 sprays into the nose daily., Disp: ,  Rfl:    Xylitol (XYLIMELTS MT), Use as directed in the mouth or throat at bedtime., Disp: , Rfl:   Cardiovascular & other pertient studies:  EKG 11/11/2022: Sinus rhythm 61 bpm First degree A-V block  Frequent PACs    Echocardiogram 05/30/2021:  1. Left ventricular ejection fraction, by estimation, is 55 to 60%. The  left ventricle has normal function. The left ventricle has no regional  wall motion abnormalities. Left ventricular diastolic parameters were  normal.   2. Right ventricular systolic function is normal. The right ventricular  size is normal.   3. The mitral valve is normal in structure. No evidence of mitral valve  regurgitation.   4. The aortic valve is normal in structure. Aortic valve regurgitation is  not visualized.   Calcium score 01/2020: - Total Calcium Score: 38. -- Left Main: 29. -- Right Coronary: 0 -- Left Anterior Descending: 9. -- Circumflex: 0.  Lexiscan (Walking with mod Bruce)Tetrofosmin Stress Test  12/25/2019: Nondiagnostic ECG stress. Normal myocardial perfusion. Stress LV EF is mildly depressed at 42% with global hypokinesis.  No previous exam available for comparison. 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 Correlate with echocardiogram. Intermediate risk study due to low LVEF.   Abdominal Aortic Duplex  01/21/2020:  The maximum aorta (sac) diameter is 2.35 cm (  prox). Diffuse calcific  plaque observed in the proximal, mid and distal aorta. Normal flow  velocities noted.  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  Normal flow velocity in the aorta and bilateral IIA.  Consider rescan in 10 years.   Carotid artery duplex  11/15/2019: Minimal stenosis in the right internal carotid artery (minimal). Minimal stenosis in the left internal carotid artery (minimal). Mild soft plaque bilateral carotid arteries. Antegrade right vertebral artery flow. Antegrade left vertebral artery flow.  Event  monitor 11/02/2019 - 12/01/2019: Diagnostic time: 100%  Dominant rhythm: Sinus. HR 36-136 bpm. Avg HR 64 bpm. Episodes of atrial fibrillation with RVR noted. Afib burden <1% NSVT up to 4 beats noted.  Sinus bradycardia with lowest HR 36 bpm, typically occurs during sleep hours.  Occasional PAC/PVC seen. No atrial flutter/SVT/high grade AV block, sinus pause >3sec noted. Symptoms reported: None   Recent labs: 12/06/2022: Glucose 100, BUN/Cr 20/1.4. EGFR NA. Na/K 141/4.2.  Hb 10 Platelets 130  08/17/2022: Chol 147, TG 36, HDL 55, LDL 83  11/24/2021: Chol 230, TG 90, HDL 44, LDL 170  05/30/2021: Glucose 114, BUN/Cr 24/1.24. EGFR 59. Na/K 138/3.5.  H/H 7.5/24.5. MCV 80. Platelets 118 TSH 2.7 normal  02/2019: Chol 157, TG 75, HDL 52, LDL 91   Review of Systems  Cardiovascular:  Negative for chest pain, dyspnea on exertion, leg swelling (Improved with compression stockings), palpitations and syncope.         Vitals:   02/10/23 1032  BP: (!) 131/57  Pulse: 71  Resp: 16  SpO2: 98%     Objective:   Physical Exam Vitals and nursing note reviewed.  Constitutional:      General: He is not in acute distress. Neck:     Vascular: No JVD.  Cardiovascular:     Rate and Rhythm: Normal rate and regular rhythm.     Heart sounds: Normal heart sounds. No murmur heard. Pulmonary:     Effort: Pulmonary effort is normal.     Breath sounds: Normal breath sounds. No wheezing or rales.  Musculoskeletal:     Right lower leg: Edema (1+) present.     Left lower leg: Edema (1+) present.       ICD-10-CM   1. Essential hypertension  I10     2. Paroxysmal atrial fibrillation (HCC)  I48.0 apixaban (ELIQUIS) 5 MG TABS tablet    Basic metabolic panel    Basic metabolic panel    3. Mixed hyperlipidemia  E78.2           Assessment & Recommendations:   82 y.o. African American male with hypertension, hyperlipidemia, OSA, paroxysmal Afib, multiple myeloma in  remission   Paroxysmal Afib: Afib burden <1%. Resting HR during sleep in 30s. Given symptoms are infrequent, would avoid scheduled AV nodal blocking agent or antiarrythmic therapy. No ischemia on stress test. Calcium score 29 (01/2020). Emphasized the use of CPAP.  CHA2DS2VASc score 3, annual stroke risk 3.2%. 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  No recent GI bleeding.  Tolerating apixaban.   Refilled Eliquis 5 mg bid. Check BMP in June. If Cr >1.5, will need to reduce eliquis to 2.5 mg bid.  Mixed hyperlipidemia, Elevated calcium score: Continue Lipitor 20 mg daily.  Hypertension:  Controlled H/o AKI with chlorthalidone, syncope with Bidil. Tolerating amlodipine 2.5 mg daily without significant leg edema.  Aorta ectasia: Mild  F/u in 6 months   Nigel Mormon, MD Pager: [phone]  Office: [phone]

## 2023-02-13 DIAGNOSIS — Z23 Encounter for immunization: Secondary | ICD-10-CM | POA: Diagnosis not present

## 2023-02-13 DIAGNOSIS — G8929 Other chronic pain: Secondary | ICD-10-CM | POA: Diagnosis not present

## 2023-02-13 DIAGNOSIS — M25552 Pain in left hip: Secondary | ICD-10-CM | POA: Diagnosis not present

## 2023-02-13 DIAGNOSIS — K5903 Drug induced constipation: Secondary | ICD-10-CM | POA: Diagnosis not present

## 2023-02-13 DIAGNOSIS — I4891 Unspecified atrial fibrillation: Secondary | ICD-10-CM | POA: Diagnosis not present

## 2023-02-13 DIAGNOSIS — C9001 Multiple myeloma in remission: Secondary | ICD-10-CM | POA: Diagnosis not present

## 2023-02-13 DIAGNOSIS — Z9481 Bone marrow transplant status: Secondary | ICD-10-CM | POA: Diagnosis not present

## 2023-02-13 DIAGNOSIS — Z79899 Other long term (current) drug therapy: Secondary | ICD-10-CM | POA: Diagnosis not present

## 2023-02-13 DIAGNOSIS — M25551 Pain in right hip: Secondary | ICD-10-CM | POA: Diagnosis not present

## 2023-02-13 DIAGNOSIS — I10 Essential (primary) hypertension: Secondary | ICD-10-CM | POA: Diagnosis not present

## 2023-02-13 DIAGNOSIS — E785 Hyperlipidemia, unspecified: Secondary | ICD-10-CM | POA: Diagnosis not present

## 2023-02-13 DIAGNOSIS — Z5112 Encounter for antineoplastic immunotherapy: Secondary | ICD-10-CM | POA: Diagnosis not present

## 2023-02-13 DIAGNOSIS — K5900 Constipation, unspecified: Secondary | ICD-10-CM | POA: Diagnosis not present

## 2023-03-01 DIAGNOSIS — R3914 Feeling of incomplete bladder emptying: Secondary | ICD-10-CM | POA: Diagnosis not present

## 2023-03-12 ENCOUNTER — Other Ambulatory Visit: Payer: Self-pay | Admitting: Internal Medicine

## 2023-03-20 DIAGNOSIS — Z9481 Bone marrow transplant status: Secondary | ICD-10-CM | POA: Diagnosis not present

## 2023-03-20 DIAGNOSIS — I10 Essential (primary) hypertension: Secondary | ICD-10-CM | POA: Diagnosis not present

## 2023-03-20 DIAGNOSIS — E785 Hyperlipidemia, unspecified: Secondary | ICD-10-CM | POA: Diagnosis not present

## 2023-03-20 DIAGNOSIS — K5903 Drug induced constipation: Secondary | ICD-10-CM | POA: Diagnosis not present

## 2023-03-20 DIAGNOSIS — C9001 Multiple myeloma in remission: Secondary | ICD-10-CM | POA: Diagnosis not present

## 2023-03-20 DIAGNOSIS — M25552 Pain in left hip: Secondary | ICD-10-CM | POA: Diagnosis not present

## 2023-03-20 DIAGNOSIS — D649 Anemia, unspecified: Secondary | ICD-10-CM | POA: Diagnosis not present

## 2023-03-20 DIAGNOSIS — Z9484 Stem cells transplant status: Secondary | ICD-10-CM | POA: Diagnosis not present

## 2023-03-20 DIAGNOSIS — I480 Paroxysmal atrial fibrillation: Secondary | ICD-10-CM | POA: Diagnosis not present

## 2023-03-20 DIAGNOSIS — Z23 Encounter for immunization: Secondary | ICD-10-CM | POA: Diagnosis not present

## 2023-03-20 DIAGNOSIS — D508 Other iron deficiency anemias: Secondary | ICD-10-CM | POA: Diagnosis not present

## 2023-03-20 DIAGNOSIS — Z5112 Encounter for antineoplastic immunotherapy: Secondary | ICD-10-CM | POA: Diagnosis not present

## 2023-03-20 DIAGNOSIS — G8929 Other chronic pain: Secondary | ICD-10-CM | POA: Diagnosis not present

## 2023-03-20 DIAGNOSIS — M25551 Pain in right hip: Secondary | ICD-10-CM | POA: Diagnosis not present

## 2023-03-20 DIAGNOSIS — M533 Sacrococcygeal disorders, not elsewhere classified: Secondary | ICD-10-CM | POA: Diagnosis not present

## 2023-03-29 ENCOUNTER — Encounter: Payer: Self-pay | Admitting: Internal Medicine

## 2023-03-29 ENCOUNTER — Ambulatory Visit (INDEPENDENT_AMBULATORY_CARE_PROVIDER_SITE_OTHER): Payer: Medicare Other | Admitting: Internal Medicine

## 2023-03-29 VITALS — BP 110/78 | HR 64 | Temp 98.1°F | Ht 75.0 in | Wt 230.0 lb

## 2023-03-29 DIAGNOSIS — N1831 Chronic kidney disease, stage 3a: Secondary | ICD-10-CM

## 2023-03-29 DIAGNOSIS — K644 Residual hemorrhoidal skin tags: Secondary | ICD-10-CM

## 2023-03-29 DIAGNOSIS — K5903 Drug induced constipation: Secondary | ICD-10-CM

## 2023-03-29 DIAGNOSIS — K5909 Other constipation: Secondary | ICD-10-CM

## 2023-03-29 DIAGNOSIS — Z6828 Body mass index (BMI) 28.0-28.9, adult: Secondary | ICD-10-CM

## 2023-03-29 DIAGNOSIS — I77811 Abdominal aortic ectasia: Secondary | ICD-10-CM | POA: Diagnosis not present

## 2023-03-29 DIAGNOSIS — C9000 Multiple myeloma not having achieved remission: Secondary | ICD-10-CM

## 2023-03-29 DIAGNOSIS — E7800 Pure hypercholesterolemia, unspecified: Secondary | ICD-10-CM

## 2023-03-29 DIAGNOSIS — I714 Abdominal aortic aneurysm, without rupture, unspecified: Secondary | ICD-10-CM

## 2023-03-29 DIAGNOSIS — I129 Hypertensive chronic kidney disease with stage 1 through stage 4 chronic kidney disease, or unspecified chronic kidney disease: Secondary | ICD-10-CM

## 2023-03-29 DIAGNOSIS — C9001 Multiple myeloma in remission: Secondary | ICD-10-CM

## 2023-03-29 NOTE — Patient Instructions (Addendum)
You were given Linzess samples,  to try once daily. This is for constipation. Make sure to stay well hydrated. If you develop loose stools, change to every other day dosing.   Check with [facility] to see if they approve Fusion Plus as an iron supplementation.   Hypertension, Adult Hypertension is another name for high blood pressure. High blood pressure forces your heart to work harder to pump blood. This can cause problems over time. There are two numbers in a blood pressure reading. There is a top number (systolic) over a bottom number (diastolic). It is best to have a blood pressure that is below 120/80. What are the causes? The cause of this condition is not known. Some other conditions can lead to high blood pressure. What increases the risk? Some lifestyle factors can make you more likely to develop high blood pressure: Smoking. Not getting enough exercise or physical activity. Being overweight. Having too much fat, sugar, calories, or salt (sodium) in your diet. Drinking too much alcohol. Other risk factors include: Having any of these conditions: Heart disease. Diabetes. High cholesterol. Kidney disease. Obstructive sleep apnea. Having a family history of high blood pressure and high cholesterol. Age. The risk increases with age. Stress. What are the signs or symptoms? High blood pressure may not cause symptoms. Very high blood pressure (hypertensive crisis) may cause: Headache. Fast or uneven heartbeats (palpitations). Shortness of breath. Nosebleed. Vomiting or feeling like you may vomit (nauseous). Changes in how you see. Very bad chest pain. Feeling dizzy. Seizures. How is this treated? This condition is treated by making healthy lifestyle changes, such as: Eating healthy foods. Exercising more. Drinking less alcohol. Your doctor may prescribe medicine if lifestyle changes do not help enough and if: Your top number is above 130. Your bottom number is above  80. Your personal target blood pressure may vary. Follow these instructions at home: Eating and drinking  If told, follow the DASH eating plan. To follow this plan: Fill one half of your plate at each meal with fruits and vegetables. Fill one fourth of your plate at each meal with whole grains. Whole grains include whole-wheat pasta, brown rice, and whole-grain bread. Eat or drink low-fat dairy products, such as skim milk or low-fat yogurt. Fill one fourth of your plate at each meal with low-fat (lean) proteins. Low-fat proteins include fish, chicken without skin, eggs, beans, and tofu. Avoid fatty meat, cured and processed meat, or chicken with skin. Avoid pre-made or processed food. Limit the amount of salt in your diet to less than 1,500 mg each day. Do not drink alcohol if: Your doctor tells you not to drink. You are pregnant, may be pregnant, or are planning to become pregnant. If you drink alcohol: Limit how much you have to: 0-1 drink a day for women. 0-2 drinks a day for men. Know how much alcohol is in your drink. In the [place] one drink equals one 12 oz bottle of beer (355 mL), one 5 oz glass of wine (148 mL), or one 1 oz glass of hard liquor (44 mL). Lifestyle  Work with your doctor to stay at a healthy weight or to lose weight. Ask your doctor what the best weight is for you. Get at least 30 minutes of exercise that causes your heart to beat faster (aerobic exercise) most days of the week. This may include walking, swimming, or biking. Get at least 30 minutes of exercise that strengthens your muscles (resistance exercise) at least 3 days a week.  This may include lifting weights or doing Pilates. Do not smoke or use any products that contain nicotine or tobacco. If you need help quitting, ask your doctor. Check your blood pressure at home as told by your doctor. Keep all follow-up visits. Medicines Take over-the-counter and prescription medicines only as told by your doctor.  Follow directions carefully. Do not skip doses of blood pressure medicine. The medicine does not work as well if you skip doses. Skipping doses also puts you at risk for problems. Ask your doctor about side effects or reactions to medicines that you should watch for. Contact a doctor if: You think you are having a reaction to the medicine you are taking. You have headaches that keep coming back. You feel dizzy. You have swelling in your ankles. You have trouble with your vision. Get help right away if: You get a very bad headache. You start to feel mixed up (confused). You feel weak or numb. You feel faint. You have very bad pain in your: Chest. Belly (abdomen). You vomit more than once. You have trouble breathing. These symptoms may be an emergency. Get help right away. Call 911. Do not wait to see if the symptoms will go away. Do not drive yourself to the hospital. Summary Hypertension is another name for high blood pressure. High blood pressure forces your heart to work harder to pump blood. For most people, a normal blood pressure is less than 120/80. Making healthy choices can help lower blood pressure. If your blood pressure does not get lower with healthy choices, you may need to take medicine. This information is not intended to replace advice given to you by your health care provider. Make sure you discuss any questions you have with your health care provider. Document Revised: 09/16/2021 Document Reviewed: 09/16/2021 Elsevier Patient Education  2023 [employer].

## 2023-03-29 NOTE — Progress Notes (Signed)
I,Victoria T Hamilton,acting as a scribe for [name], MD.,have documented all relevant documentation on the behalf of [name], MD,as directed by  [name], MD while in the presence of [name], MD.    Subjective:     Patient ID: Edward Gallagher , male    DOB: 11-17-1941 , 82 y.o.   MRN: [id number]   Chief Complaint  Patient presents with   Hypertension   Hyperlipidemia    HPI  Patient presents today for a BP & chol check, reports compliance with medications. Denies headache, chest pain, and SOB.   He reports having constipation since recently being prescribed an iron supplement.  His last bowel movement was on Monday.      Hypertension This is a chronic problem. The current episode started more than 1 year ago. The problem has been gradually improving since onset. The problem is controlled. Pertinent negatives include no blurred vision, chest pain, palpitations or shortness of breath. Risk factors for coronary artery disease include male gender and obesity. Identifiable causes of hypertension include chronic renal disease.  Hyperlipidemia This is a chronic problem. The current episode started more than 1 year ago. Exacerbating diseases include chronic renal disease. He has no history of obesity. Pertinent negatives include no chest pain or shortness of breath.     Past Medical History:  Diagnosis Date   A-fib [facility])    Allergy    Arthritis    Hyperlipidemia    Hypertension    Multiple myeloma (HCC)    Prostatic hypertrophy    Sinus trouble    Sleep apnea    CPAP   Wears dentures    full upper, partial lower     Family History  Problem Relation Age of Onset   Healthy Mother    Prostate cancer Father    Testicular cancer Brother    Prostate cancer Brother    Lung cancer Brother    Cancer Brother        double mynoma   Hypertension Other      Current Outpatient Medications:    acyclovir (ZOVIRAX) 400 MG tablet, Take 400 mg by mouth 2  (two) times daily., Disp: , Rfl:    amLODipine (NORVASC) 2.5 MG tablet, Take 2.5 mg by mouth every other day., Disp: , Rfl:    apixaban (ELIQUIS) 5 MG TABS tablet, Take 1 tablet (5 mg total) by mouth 2 (two) times daily., Disp: 180 tablet, Rfl: 3   atorvastatin (LIPITOR) 40 MG tablet, Take 1 tablet (40 mg total) by mouth daily., Disp: 90 tablet, Rfl: 3   cetirizine (ZYRTEC) 10 MG tablet, Take 10 mg by mouth daily., Disp: , Rfl:    ferrous sulfate 325 (65 FE) MG EC tablet, Take 1 tablet by mouth 2 (two) times daily., Disp: , Rfl:    finasteride (PROSCAR) 5 MG tablet, Take 5 mg by mouth daily., Disp: , Rfl:    fluticasone (FLONASE) 50 MCG/ACT nasal spray, SHAKE LIQUID AND USE 1 SPRAY IN EACH NOSTRIL DAILY, Disp: 16 g, Rfl: 2   furosemide (LASIX) 20 MG tablet, Take 1 tablet (20 mg total) by mouth daily., Disp: 30 tablet, Rfl: 3   lenalidomide (REVLIMID) 5 MG capsule, Take 5 mg by mouth daily., Disp: , Rfl:    Misc Natural Products (NEURIVA PO), Take by mouth at bedtime., Disp: , Rfl:    terazosin (HYTRIN) 10 MG capsule, TAKE 1 CAPSULE(10 MG) BY MOUTH AT BEDTIME (Patient taking differently: Take 5  mg by mouth at bedtime.), Disp: 90 capsule, Rfl: 1   traMADol (ULTRAM) 50 MG tablet, Take 1 tablet (50 mg total) by mouth every 12 (twelve) hours as needed., Disp: 30 tablet, Rfl: 0   triamcinolone (NASACORT) 55 MCG/ACT AERO nasal inhaler, Place 2 sprays into the nose daily., Disp: , Rfl:    Xylitol (XYLIMELTS MT), Use as directed in the mouth or throat at bedtime., Disp: , Rfl:    nitroGLYCERIN (NITROSTAT) 0.4 MG SL tablet, Place 1 tablet (0.4 mg total) under the tongue every 5 (five) minutes as needed for chest pain., Disp: 100 tablet, Rfl: 3   No Known Allergies   Review of Systems  Constitutional: Negative.   Eyes:  Negative for blurred vision.  Respiratory: Negative.  Negative for shortness of breath.   Cardiovascular: Negative.  Negative for chest pain and palpitations.  Gastrointestinal:   Positive for constipation.  Endocrine: Negative.   Genitourinary: Negative.   Skin: Negative.   Allergic/Immunologic: Negative.   Psychiatric/Behavioral: Negative.       Today's Vitals   03/29/23 1007  BP: 110/78  Pulse: 64  Temp: 98.1 F (36.7 C)  SpO2: 98%  Weight: 230 lb (104.3 kg)  Height: 6\' 3"  (1.905 m)   Body mass index is 28.75 kg/m.  Wt Readings from Last 3 Encounters:  03/29/23 230 lb (104.3 kg)  02/10/23 230 lb (104.3 kg)  12/22/22 229 lb 6.4 oz (104.1 kg)    Objective:  Physical Exam Vitals and nursing note reviewed.  Constitutional:      Appearance: Normal appearance.  HENT:     Head: Normocephalic and atraumatic.  Eyes:     Extraocular Movements: Extraocular movements intact.  Cardiovascular:     Rate and Rhythm: Normal rate and regular rhythm.     Heart sounds: Normal heart sounds.  Pulmonary:     Effort: Pulmonary effort is normal.     Breath sounds: Normal breath sounds.  Musculoskeletal:     Cervical back: Normal range of motion.  Skin:    General: Skin is warm.  Neurological:     General: No focal deficit present.     Mental Status: He is alert.  Psychiatric:        Mood and Affect: Mood normal.      Assessment And Plan:     1. Hypertensive nephropathy Comments: Chronic, improved control. He will c/w amlodipine daily. Encouraged to follow a low sodium diet.  2. Stage 3a chronic kidney disease (HCC) Comments: Chronic, he is reminded to avoid NSAIDs, stay well hydrated and keep BP well controlled to decrease risk of CKD progression.  3. Drug-induced constipation Comments: Due to oral iron. Currently on Senokot, which has been ineffective. He was given samples of Linzess, 72mg  daily. He will let me know if this is helpful.  4. Multiple myeloma, remission status unspecified (HCC) Comments: He is s/p stem cell transplant. Currently on Revlimid. Most recent Oncology notes reviewed.  5. Ectatic abdominal aorta (HCC) Comments: Abdominal  aortic ectasia with maximum measuring 2.33 x 2.35 x 2.3 cm was seen in 2021. Cardiology notes reviewed, repeat in 10 years.  6. Bleeding external hemorrhoids Comments: He is interested in banding. He plans to schedule an appt with Dr. Elnoria Howard for further evaluation.  7. Body mass index (BMI) 28.0-28.9, adult Comments: He is encouraged to aim for at least 150 minutes of exercise/week.   Patient was given opportunity to ask questions. Patient verbalized understanding of the plan and was able to repeat  key elements of the plan. All questions were answered to their satisfaction.   I, [name], MD, have reviewed all documentation for this visit. The documentation on 03/29/23 for the exam, diagnosis, procedures, and orders are all accurate and complete.   IF YOU HAVE BEEN REFERRED TO A SPECIALIST, IT MAY TAKE 1-2 WEEKS TO SCHEDULE/PROCESS THE REFERRAL. IF YOU HAVE NOT HEARD FROM US/SPECIALIST IN TWO WEEKS, PLEASE GIVE [place] A CALL AT [phone].   THE PATIENT IS ENCOURAGED TO PRACTICE SOCIAL DISTANCING DUE TO THE COVID-19 PANDEMIC.

## 2023-04-02 ENCOUNTER — Other Ambulatory Visit: Payer: Self-pay | Admitting: Internal Medicine

## 2023-04-03 DIAGNOSIS — K6289 Other specified diseases of anus and rectum: Secondary | ICD-10-CM | POA: Diagnosis not present

## 2023-04-03 DIAGNOSIS — K625 Hemorrhage of anus and rectum: Secondary | ICD-10-CM | POA: Diagnosis not present

## 2023-04-03 DIAGNOSIS — K602 Anal fissure, unspecified: Secondary | ICD-10-CM | POA: Diagnosis not present

## 2023-04-05 ENCOUNTER — Encounter: Payer: Self-pay | Admitting: Cardiology

## 2023-04-08 DIAGNOSIS — K5909 Other constipation: Secondary | ICD-10-CM | POA: Insufficient documentation

## 2023-04-08 DIAGNOSIS — I129 Hypertensive chronic kidney disease with stage 1 through stage 4 chronic kidney disease, or unspecified chronic kidney disease: Secondary | ICD-10-CM | POA: Insufficient documentation

## 2023-04-08 DIAGNOSIS — N1831 Chronic kidney disease, stage 3a: Secondary | ICD-10-CM | POA: Insufficient documentation

## 2023-04-08 DIAGNOSIS — C9000 Multiple myeloma not having achieved remission: Secondary | ICD-10-CM | POA: Insufficient documentation

## 2023-04-08 DIAGNOSIS — I1310 Hypertensive heart and chronic kidney disease without heart failure, with stage 1 through stage 4 chronic kidney disease, or unspecified chronic kidney disease: Secondary | ICD-10-CM | POA: Insufficient documentation

## 2023-04-13 ENCOUNTER — Ambulatory Visit (INDEPENDENT_AMBULATORY_CARE_PROVIDER_SITE_OTHER): Payer: Medicare Other

## 2023-04-13 ENCOUNTER — Ambulatory Visit: Payer: Medicare Other | Admitting: Internal Medicine

## 2023-04-13 VITALS — BP 126/60 | HR 65 | Temp 98.3°F | Ht 75.0 in | Wt 228.2 lb

## 2023-04-13 DIAGNOSIS — Z0000 Encounter for general adult medical examination without abnormal findings: Secondary | ICD-10-CM

## 2023-04-13 NOTE — Patient Instructions (Signed)
Edward Gallagher , Thank you for taking time to come for your Medicare Wellness Visit. I appreciate your ongoing commitment to your health goals. Please review the following plan we discussed and let me know if I can assist you in the future.   These are the goals we discussed:  Goals      Patient Stated     04/06/2022, no goals     Patient Stated     04/13/2023, wants to weigh 220 pounds     Weight (lb) < 200 lb (90.7 kg)     Wants to lose 20 pounds     Weight (lb) < 200 lb (90.7 kg)     03/25/2020, wants to get to 220 pounds        This is a list of the screening recommended for you and due dates:  Health Maintenance  Topic Date Due   COVID-19 Vaccine (10 - 2023-24 season) 12/28/2022   Flu Shot  07/13/2023   Medicare Annual Wellness Visit  04/12/2024   DTaP/Tdap/Td vaccine (4 - Tdap) 03/19/2033   Pneumonia Vaccine  Completed   Zoster (Shingles) Vaccine  Completed   HPV Vaccine  Aged Out    Advanced directives: copy in chart  Conditions/risks identified: none  Next appointment: Follow up in one year for your annual wellness visit.   Preventive Care 81 Years and Older, Male  Preventive care refers to lifestyle choices and visits with your health care provider that can promote health and wellness. What does preventive care include? A yearly physical exam. This is also called an annual well check. Dental exams once or twice a year. Routine eye exams. Ask your health care provider how often you should have your eyes checked. Personal lifestyle choices, including: Daily care of your teeth and gums. Regular physical activity. Eating a healthy diet. Avoiding tobacco and drug use. Limiting alcohol use. Practicing safe sex. Taking low doses of aspirin every day. Taking vitamin and mineral supplements as recommended by your health care provider. What happens during an annual well check? The services and screenings done by your health care provider during your annual well check  will depend on your age, overall health, lifestyle risk factors, and family history of disease. Counseling  Your health care provider may ask you questions about your: Alcohol use. Tobacco use. Drug use. Emotional well-being. Home and relationship well-being. Sexual activity. Eating habits. History of falls. Memory and ability to understand (cognition). Work and work Astronomer. Screening  You may have the following tests or measurements: Height, weight, and BMI. Blood pressure. Lipid and cholesterol levels. These may be checked every 5 years, or more frequently if you are over 86 years old. Skin check. Lung cancer screening. You may have this screening every year starting at age 85 if you have a 30-pack-year history of smoking and currently smoke or have quit within the past 15 years. Fecal occult blood test (FOBT) of the stool. You may have this test every year starting at age 21. Flexible sigmoidoscopy or colonoscopy. You may have a sigmoidoscopy every 5 years or a colonoscopy every 10 years starting at age 43. Prostate cancer screening. Recommendations will vary depending on your family history and other risks. Hepatitis C blood test. Hepatitis B blood test. Sexually transmitted disease (STD) testing. Diabetes screening. This is done by checking your blood sugar (glucose) after you have not eaten for a while (fasting). You may have this done every 1-3 years. Abdominal aortic aneurysm (AAA) screening. You may need  this if you are a current or former smoker. Osteoporosis. You may be screened starting at age 57 if you are at high risk. Talk with your health care provider about your test results, treatment options, and if necessary, the need for more tests. Vaccines  Your health care provider may recommend certain vaccines, such as: Influenza vaccine. This is recommended every year. Tetanus, diphtheria, and acellular pertussis (Tdap, Td) vaccine. You may need a Td booster every 10  years. Zoster vaccine. You may need this after age 49. Pneumococcal 13-valent conjugate (PCV13) vaccine. One dose is recommended after age 21. Pneumococcal polysaccharide (PPSV23) vaccine. One dose is recommended after age 78. Talk to your health care provider about which screenings and vaccines you need and how often you need them. This information is not intended to replace advice given to you by your health care provider. Make sure you discuss any questions you have with your health care provider. Document Released: 12/25/2015 Document Revised: 08/17/2016 Document Reviewed: 09/29/2015 Elsevier Interactive Patient Education  2017 [employer].  Fall Prevention in the Home Falls can cause injuries. They can happen to people of all ages. There are many things you can do to make your home safe and to help prevent falls. What can I do on the outside of my home? Regularly fix the edges of walkways and driveways and fix any cracks. Remove anything that might make you trip as you walk through a door, such as a raised step or threshold. Trim any bushes or trees on the path to your home. Use bright outdoor lighting. Clear any walking paths of anything that might make someone trip, such as rocks or tools. Regularly check to see if handrails are loose or broken. Make sure that both sides of any steps have handrails. Any raised decks and porches should have guardrails on the edges. Have any leaves, snow, or ice cleared regularly. Use sand or salt on walking paths during winter. Clean up any spills in your garage right away. This includes oil or grease spills. What can I do in the bathroom? Use night lights. Install grab bars by the toilet and in the tub and shower. Do not use towel bars as grab bars. Use non-skid mats or decals in the tub or shower. If you need to sit down in the shower, use a plastic, non-slip stool. Keep the floor dry. Clean up any water that spills on the floor as soon as it  happens. Remove soap buildup in the tub or shower regularly. Attach bath mats securely with double-sided non-slip rug tape. Do not have throw rugs and other things on the floor that can make you trip. What can I do in the bedroom? Use night lights. Make sure that you have a light by your bed that is easy to reach. Do not use any sheets or blankets that are too big for your bed. They should not hang down onto the floor. Have a firm chair that has side arms. You can use this for support while you get dressed. Do not have throw rugs and other things on the floor that can make you trip. What can I do in the kitchen? Clean up any spills right away. Avoid walking on wet floors. Keep items that you use a lot in easy-to-reach places. If you need to reach something above you, use a strong step stool that has a grab bar. Keep electrical cords out of the way. Do not use floor polish or wax that makes floors  slippery. If you must use wax, use non-skid floor wax. Do not have throw rugs and other things on the floor that can make you trip. What can I do with my stairs? Do not leave any items on the stairs. Make sure that there are handrails on both sides of the stairs and use them. Fix handrails that are broken or loose. Make sure that handrails are as long as the stairways. Check any carpeting to make sure that it is firmly attached to the stairs. Fix any carpet that is loose or worn. Avoid having throw rugs at the top or bottom of the stairs. If you do have throw rugs, attach them to the floor with carpet tape. Make sure that you have a light switch at the top of the stairs and the bottom of the stairs. If you do not have them, ask someone to add them for you. What else can I do to help prevent falls? Wear shoes that: Do not have high heels. Have rubber bottoms. Are comfortable and fit you well. Are closed at the toe. Do not wear sandals. If you use a stepladder: Make sure that it is fully opened.  Do not climb a closed stepladder. Make sure that both sides of the stepladder are locked into place. Ask someone to hold it for you, if possible. Clearly mark and make sure that you can see: Any grab bars or handrails. First and last steps. Where the edge of each step is. Use tools that help you move around (mobility aids) if they are needed. These include: Canes. Walkers. Scooters. Crutches. Turn on the lights when you go into a dark area. Replace any light bulbs as soon as they burn out. Set up your furniture so you have a clear path. Avoid moving your furniture around. If any of your floors are uneven, fix them. If there are any pets around you, be aware of where they are. Review your medicines with your doctor. Some medicines can make you feel dizzy. This can increase your chance of falling. Ask your doctor what other things that you can do to help prevent falls. This information is not intended to replace advice given to you by your health care provider. Make sure you discuss any questions you have with your health care provider. Document Released: 09/24/2009 Document Revised: 05/05/2016 Document Reviewed: 01/02/2015 Elsevier Interactive Patient Education  2017 [employer].

## 2023-04-13 NOTE — Progress Notes (Signed)
Subjective:   Edward Gallagher is a 82 y.o. male who presents for Medicare Annual/Subsequent preventive examination.  Patient Medicare AWV questionnaire was completed by the patient on 04/08/2023; I have confirmed that all information answered by patient is correct and no changes since this date.     Review of Systems     Cardiac Risk Factors include: advanced age (>75men, >21 women);male gender     Objective:    Today's Vitals   04/13/23 1450  BP: 126/60  Pulse: 65  Temp: 98.3 F (36.8 C)  TempSrc: Oral  SpO2: 95%  Weight: 228 lb 3.2 oz (103.5 kg)  Height: 6\' 3"  (1.905 m)   Body mass index is 28.52 kg/m.     04/13/2023    3:03 PM 10/26/2022   12:24 PM 10/12/2022    7:22 AM 04/06/2022    3:07 PM 05/29/2021   10:33 AM 03/31/2021   10:10 AM 03/05/2021    7:49 AM  Advanced Directives  Does Patient Have a Medical Advance Directive? Yes Yes Yes Yes No No No  Type of Estate agent of [place];Living will Living will;Healthcare Power of [employer] Power of [place];Living will Healthcare Power of [place];Living will     Does patient want to make changes to medical advance directive?   No - Guardian declined      Copy of Healthcare Power of Attorney in Chart? No - copy requested No - copy requested No - copy requested No - copy requested     Would patient like information on creating a medical advance directive?     No - Patient declined No - Patient declined No - Patient declined    Current Medications (verified) Outpatient Encounter Medications as of 04/13/2023  Medication Sig   acyclovir (ZOVIRAX) 400 MG tablet Take 400 mg by mouth 2 (two) times daily.   amLODipine (NORVASC) 2.5 MG tablet Take 2.5 mg by mouth every other day.   apixaban (ELIQUIS) 5 MG TABS tablet Take 1 tablet (5 mg total) by mouth 2 (two) times daily.   atorvastatin (LIPITOR) 40 MG tablet Take 1 tablet (40 mg total) by mouth daily.   cetirizine (ZYRTEC) 10 MG tablet Take 10 mg by  mouth daily.   ferrous sulfate 325 (65 FE) MG EC tablet Take 1 tablet by mouth 2 (two) times daily.   finasteride (PROSCAR) 5 MG tablet Take 5 mg by mouth daily.   fluticasone (FLONASE) 50 MCG/ACT nasal spray SHAKE LIQUID AND USE 1 SPRAY IN EACH NOSTRIL DAILY   furosemide (LASIX) 20 MG tablet Take 1 tablet (20 mg total) by mouth daily.   lenalidomide (REVLIMID) 5 MG capsule Take 5 mg by mouth daily.   linaclotide (LINZESS) 145 MCG CAPS capsule Take 145 mcg by mouth daily before breakfast.   Misc Natural Products (NEURIVA PO) Take by mouth at bedtime.   terazosin (HYTRIN) 10 MG capsule TAKE 1 CAPSULE(10 MG) BY MOUTH AT BEDTIME (Patient taking differently: Take 5 mg by mouth at bedtime.)   traMADol (ULTRAM) 50 MG tablet Take 1 tablet (50 mg total) by mouth every 12 (twelve) hours as needed.   triamcinolone (NASACORT) 55 MCG/ACT AERO nasal inhaler Place 2 sprays into the nose daily.   Xylitol (XYLIMELTS MT) Use as directed in the mouth or throat at bedtime.   nitroGLYCERIN (NITROSTAT) 0.4 MG SL tablet Place 1 tablet (0.4 mg total) under the tongue every 5 (five) minutes as needed for chest pain.   No facility-administered encounter medications  on file as of 04/13/2023.    Allergies (verified) Patient has no known allergies.   History: Past Medical History:  Diagnosis Date   A-fib (HCC)    Allergy    Arthritis    Hyperlipidemia    Hypertension    Multiple myeloma (HCC)    Prostatic hypertrophy    Sinus trouble    Sleep apnea    CPAP   Wears dentures    full upper, partial lower   Past Surgical History:  Procedure Laterality Date   CATARACT EXTRACTION W/PHACO Right 10/12/2022   Procedure: CATARACT EXTRACTION PHACO AND INTRAOCULAR LENS PLACEMENT (IOC) RIGHT CLARION PANOPTIC LENS 8.36 01:03.5;  Surgeon: Lockie Mola, MD;  Location: [facility];  Service: Ophthalmology;  Laterality: Right;  sleep apnea   CATARACT EXTRACTION W/PHACO Left 10/26/2022   Procedure: CATARACT  EXTRACTION PHACO AND INTRAOCULAR LENS PLACEMENT (IOC) LEFT CLAREON PANOPTIC LENS  4.20  00:48.1;  Surgeon: Lockie Mola, MD;  Location: [facility];  Service: Ophthalmology;  Laterality: Left;  sleep apnea   HERNIA REPAIR     VASECTOMY     Family History  Problem Relation Age of Onset   Healthy Mother    Prostate cancer Father    Cancer Father    Testicular cancer Brother    Hypertension Brother    Prostate cancer Brother    Lung cancer Brother    Cancer Brother    Cancer Brother        double mynoma   Hypertension Other    Social History   Socioeconomic History   Marital status: Married    Spouse name: Not on file   Number of children: 0   Years of education: Not on file   Highest education level: Associate degree: academic program  Occupational History   Occupation: retired  Tobacco Use   Smoking status: Former    Packs/day: 0.50    Years: 35.00    Additional pack years: 0.00    Total pack years: 17.50    Types: Cigarettes    Quit date: 12/12/1997    Years since quitting: 25.3   Smokeless tobacco: Never   Tobacco comments:    I quit in Sept , 1999  Vaping Use   Vaping Use: Never used  Substance and Sexual Activity   Alcohol use: Not Currently    Comment: ocasional beer   Drug use: No   Sexual activity: Not Currently  Other Topics Concern   Not on file  Social History Narrative   Not on file   Social Determinants of Health   Financial Resource Strain: Low Risk  (04/08/2023)   Overall Financial Resource Strain (CARDIA)    Difficulty of Paying Living Expenses: Not hard at all  Food Insecurity: No Food Insecurity (04/08/2023)   Hunger Vital Sign    Worried About Running Out of Food in the Last Year: Never true    Ran Out of Food in the Last Year: Never true  Transportation Needs: No Transportation Needs (04/08/2023)   PRAPARE - Administrator, Civil Service (Medical): No    Lack of Transportation (Non-Medical): No  Physical  Activity: Insufficiently Active (04/08/2023)   Exercise Vital Sign    Days of Exercise per Week: 3 days    Minutes of Exercise per Session: 30 min  Stress: No Stress Concern Present (04/08/2023)   [employer] of Occupational Health - Occupational Stress Questionnaire    Feeling of Stress : Not at all  Social Connections:  Socially Integrated (04/08/2023)   Social Connection and Isolation Panel [NHANES]    Frequency of Communication with Friends and Family: Twice a week    Frequency of Social Gatherings with Friends and Family: Twice a week    Attends Religious Services: More than 4 times per year    Active Member of [employer] or Organizations: Yes    Attends Banker Meetings: 1 to 4 times per year    Marital Status: Married    Tobacco Counseling Counseling given: Not Answered Tobacco comments: I quit in Sept , 1999   Clinical Intake:  Pre-visit preparation completed: Yes  Pain : No/denies pain     Nutritional Status: BMI 25 -29 Overweight Nutritional Risks: None Diabetes: No  How often do you need to have someone help you when you read instructions, pamphlets, or other written materials from your doctor or pharmacy?: 1 - Never  Diabetic? No   Interpreter Needed?: No  Information entered by :: NAllen LPN   Activities of Daily Living    04/08/2023    2:36 PM 10/26/2022   12:19 PM  In your present state of health, do you have any difficulty performing the following activities:  Hearing? 0 0  Vision? 0 0  Difficulty concentrating or making decisions? 0 0  Walking or climbing stairs? 0 0  Dressing or bathing? 0 0  Doing errands, shopping? 0   Preparing Food and eating ? N   Using the Toilet? N   In the past six months, have you accidently leaked urine? N   Do you have problems with loss of bowel control? N   Managing your Medications? N   Managing your Finances? N   Housekeeping or managing your Housekeeping? N     Patient Care Team: Dorothyann Peng, MD as PCP - General (Internal Medicine)  Indicate any recent Medical Services you may have received from other than [facility] providers in the past year (date may be approximate).     Assessment:   This is a routine wellness examination for Edward Gallagher.  Hearing/Vision screen Vision Screening - Comments:: Regular eye exams, [facility]  Dietary issues and exercise activities discussed: Current Exercise Habits: Home exercise routine, Type of exercise: walking, Time (Minutes): 30, Frequency (Times/Week): 3, Weekly Exercise (Minutes/Week): 90   Goals Addressed             This Visit's Progress    Patient Stated       04/13/2023, wants to weigh 220 pounds       Depression Screen    04/13/2023    3:04 PM 03/29/2023   10:13 AM 03/29/2023   10:07 AM 04/06/2022    3:08 PM 03/31/2021   10:11 AM 03/31/2021    9:33 AM 03/25/2020   10:35 AM  PHQ 2/9 Scores  PHQ - 2 Score 0 0 0 0 0 0 0  PHQ- 9 Score  0 0        Fall Risk    04/08/2023    2:36 PM 03/29/2023   10:07 AM 04/06/2022    3:08 PM 03/31/2021   10:10 AM 03/31/2021    9:33 AM  Fall Risk   Falls in the past year? 0 0 0 0 0  Number falls in past yr: 0 0 0    Injury with Fall? 0 0 0    Risk for fall due to : Medication side effect No Fall Risks Medication side effect Medication side effect   Follow up Falls  prevention discussed;Education provided;Falls evaluation completed Falls evaluation completed Falls evaluation completed;Education provided;Falls prevention discussed Falls evaluation completed;Education provided;Falls prevention discussed     FALL RISK PREVENTION PERTAINING TO THE HOME:  Any stairs in or around the home? No  If so, are there any without handrails? N/a Home free of loose throw rugs in walkways, pet beds, electrical cords, etc? Yes  Adequate lighting in your home to reduce risk of falls? Yes   ASSISTIVE DEVICES UTILIZED TO PREVENT FALLS:  Life alert? No  Use of a cane, walker or w/c? No  Grab bars in  the bathroom? Yes  Shower chair or bench in shower? Yes  Elevated toilet seat or a handicapped toilet? Yes   TIMED UP AND GO:  Was the test performed? Yes .  Length of time to ambulate 10 feet: 5 sec.   Gait steady and fast without use of assistive device  Cognitive Function:        04/13/2023    3:05 PM 04/06/2022    3:11 PM 03/31/2021   10:11 AM 03/25/2020   10:36 AM 05/08/2019    3:00 PM  6CIT Screen  What Year? 0 points 0 points 0 points 0 points 0 points  What month? 0 points 0 points 0 points 0 points 0 points  What time? 0 points 0 points 0 points 0 points 0 points  Count back from 20 2 points 0 points 0 points 4 points 0 points  Months in reverse 0 points 0 points 0 points 0 points 0 points  Repeat phrase 0 points 0 points 2 points 0 points 0 points  Total Score 2 points 0 points 2 points 4 points 0 points    Immunizations Immunization History  Administered Date(s) Administered   DTaP / HiB / IPV 09/26/2022, 12/06/2022, 03/20/2023   Fluad Quad(high Dose 65+) 08/15/2020, 11/03/2021, 08/23/2022   Hepb-cpg 12/06/2022, 03/20/2023   Influenza, High Dose Seasonal PF 08/14/2019   Influenza-Unspecified 08/13/2015, 08/20/2018, 08/16/2019   PFIZER Comirnaty(Gray Top)Covid-19 Tri-Sucrose Vaccine 01/03/2020, 01/24/2020, 09/14/2020, 04/19/2021, 11/22/2021, 11/02/2022   PFIZER(Purple Top)SARS-COV-2 Vaccination 01/03/2020, 01/24/2020, 09/14/2020   PNEUMOCOCCAL CONJUGATE-20 06/07/2022, 09/26/2022, 12/06/2022, 03/20/2023   Pneumococcal Conjugate-13 08/13/2015   Pneumococcal Polysaccharide-23 11/03/2021   Zoster Recombinat (Shingrix) 08/14/2019, 10/13/2019   Zoster, Live 12/12/2013    TDAP status: Up to date  Flu Vaccine status: Up to date  Pneumococcal vaccine status: Up to date  Covid-19 vaccine status: Completed vaccines  Qualifies for Shingles Vaccine? Yes   Zostavax completed Yes   Shingrix Completed?: Yes  Screening Tests Health Maintenance  Topic Date Due    COVID-19 Vaccine (10 - 2023-24 season) 12/28/2022   INFLUENZA VACCINE  07/13/2023   Medicare Annual Wellness (AWV)  04/12/2024   DTaP/Tdap/Td (4 - Tdap) 03/19/2033   Pneumonia Vaccine 69+ Years old  Completed   Zoster Vaccines- Shingrix  Completed   HPV VACCINES  Aged Out    Health Maintenance  Health Maintenance Due  Topic Date Due   COVID-19 Vaccine (10 - 2023-24 season) 12/28/2022    Colorectal cancer screening: No longer required.   Lung Cancer Screening: (Low Dose CT Chest recommended if Age 73-80 years, 30 pack-year currently smoking OR have quit w/in 15years.) does not qualify.   Lung Cancer Screening Referral: no  Additional Screening:  Hepatitis C Screening: does not qualify;   Vision Screening: Recommended annual ophthalmology exams for early detection of glaucoma and other disorders of the eye. Is the patient up to date with their annual eye exam?  Yes  Who is the provider or what is the name of the office in which the patient attends annual eye exams? [facility] If pt is not established with a provider, would they like to be referred to a provider to establish care? No .   Dental Screening: Recommended annual dental exams for proper oral hygiene  Community Resource Referral / Chronic Care Management: CRR required this visit?  No   CCM required this visit?  No         I have personally reviewed and noted the following in the patient's chart:   Medical and social history Use of alcohol, tobacco or illicit drugs  Current medications and supplements including opioid prescriptions. Patient is not currently taking opioid prescriptions. Functional ability and status Nutritional status Physical activity Advanced directives List of other physicians Hospitalizations, surgeries, and ER visits in previous 12 months Vitals Screenings to include cognitive, depression, and falls Referrals and appointments  In addition, I have reviewed and discussed  with patient certain preventive protocols, quality metrics, and best practice recommendations. A written personalized care plan for preventive services as well as general preventive health recommendations were provided to patient.     Barb Merino, LPN   07/13/9561   Nurse Notes: none

## 2023-04-14 ENCOUNTER — Other Ambulatory Visit: Payer: Self-pay | Admitting: Internal Medicine

## 2023-04-14 MED ORDER — JUBLIA 10 % EX SOLN: CUTANEOUS | 3 refills | Status: DC

## 2023-04-24 DIAGNOSIS — Z9481 Bone marrow transplant status: Secondary | ICD-10-CM | POA: Diagnosis not present

## 2023-04-24 DIAGNOSIS — M25552 Pain in left hip: Secondary | ICD-10-CM | POA: Diagnosis not present

## 2023-04-24 DIAGNOSIS — G8929 Other chronic pain: Secondary | ICD-10-CM | POA: Diagnosis not present

## 2023-04-24 DIAGNOSIS — K5900 Constipation, unspecified: Secondary | ICD-10-CM | POA: Diagnosis not present

## 2023-04-24 DIAGNOSIS — D508 Other iron deficiency anemias: Secondary | ICD-10-CM | POA: Diagnosis not present

## 2023-04-24 DIAGNOSIS — R7989 Other specified abnormal findings of blood chemistry: Secondary | ICD-10-CM | POA: Diagnosis not present

## 2023-04-24 DIAGNOSIS — K5903 Drug induced constipation: Secondary | ICD-10-CM | POA: Diagnosis not present

## 2023-04-24 DIAGNOSIS — D702 Other drug-induced agranulocytosis: Secondary | ICD-10-CM | POA: Diagnosis not present

## 2023-04-24 DIAGNOSIS — I4891 Unspecified atrial fibrillation: Secondary | ICD-10-CM | POA: Diagnosis not present

## 2023-04-24 DIAGNOSIS — I10 Essential (primary) hypertension: Secondary | ICD-10-CM | POA: Diagnosis not present

## 2023-04-24 DIAGNOSIS — E785 Hyperlipidemia, unspecified: Secondary | ICD-10-CM | POA: Diagnosis not present

## 2023-04-24 DIAGNOSIS — C9000 Multiple myeloma not having achieved remission: Secondary | ICD-10-CM | POA: Diagnosis not present

## 2023-04-24 DIAGNOSIS — Z79899 Other long term (current) drug therapy: Secondary | ICD-10-CM | POA: Diagnosis not present

## 2023-04-24 DIAGNOSIS — M25551 Pain in right hip: Secondary | ICD-10-CM | POA: Diagnosis not present

## 2023-04-24 DIAGNOSIS — C9001 Multiple myeloma in remission: Secondary | ICD-10-CM | POA: Diagnosis not present

## 2023-04-26 DIAGNOSIS — K921 Melena: Secondary | ICD-10-CM | POA: Diagnosis not present

## 2023-04-26 DIAGNOSIS — R195 Other fecal abnormalities: Secondary | ICD-10-CM | POA: Diagnosis not present

## 2023-05-01 DIAGNOSIS — M25552 Pain in left hip: Secondary | ICD-10-CM | POA: Diagnosis not present

## 2023-05-01 DIAGNOSIS — Z9481 Bone marrow transplant status: Secondary | ICD-10-CM | POA: Diagnosis not present

## 2023-05-01 DIAGNOSIS — M25551 Pain in right hip: Secondary | ICD-10-CM | POA: Diagnosis not present

## 2023-05-01 DIAGNOSIS — C9001 Multiple myeloma in remission: Secondary | ICD-10-CM | POA: Diagnosis not present

## 2023-05-01 DIAGNOSIS — K5903 Drug induced constipation: Secondary | ICD-10-CM | POA: Diagnosis not present

## 2023-05-01 DIAGNOSIS — I4891 Unspecified atrial fibrillation: Secondary | ICD-10-CM | POA: Diagnosis not present

## 2023-05-01 DIAGNOSIS — I10 Essential (primary) hypertension: Secondary | ICD-10-CM | POA: Diagnosis not present

## 2023-05-01 DIAGNOSIS — D508 Other iron deficiency anemias: Secondary | ICD-10-CM | POA: Diagnosis not present

## 2023-05-01 DIAGNOSIS — Z86718 Personal history of other venous thrombosis and embolism: Secondary | ICD-10-CM | POA: Diagnosis not present

## 2023-05-01 DIAGNOSIS — K5900 Constipation, unspecified: Secondary | ICD-10-CM | POA: Diagnosis not present

## 2023-05-01 DIAGNOSIS — E785 Hyperlipidemia, unspecified: Secondary | ICD-10-CM | POA: Diagnosis not present

## 2023-05-01 DIAGNOSIS — D702 Other drug-induced agranulocytosis: Secondary | ICD-10-CM | POA: Diagnosis not present

## 2023-05-01 DIAGNOSIS — Z7901 Long term (current) use of anticoagulants: Secondary | ICD-10-CM | POA: Diagnosis not present

## 2023-05-01 DIAGNOSIS — K602 Anal fissure, unspecified: Secondary | ICD-10-CM | POA: Diagnosis not present

## 2023-05-02 ENCOUNTER — Other Ambulatory Visit: Payer: Self-pay | Admitting: Internal Medicine

## 2023-05-04 ENCOUNTER — Other Ambulatory Visit: Payer: Self-pay

## 2023-05-09 DIAGNOSIS — Z9481 Bone marrow transplant status: Secondary | ICD-10-CM | POA: Diagnosis not present

## 2023-05-09 DIAGNOSIS — C9001 Multiple myeloma in remission: Secondary | ICD-10-CM | POA: Diagnosis not present

## 2023-05-09 DIAGNOSIS — E611 Iron deficiency: Secondary | ICD-10-CM | POA: Diagnosis not present

## 2023-05-10 ENCOUNTER — Other Ambulatory Visit: Payer: Self-pay

## 2023-05-10 MED ORDER — LINACLOTIDE 145 MCG PO CAPS: 145.0000 ug | ORAL_CAPSULE | Freq: Every day | ORAL | 2 refills | Status: DC

## 2023-06-12 DIAGNOSIS — E785 Hyperlipidemia, unspecified: Secondary | ICD-10-CM | POA: Diagnosis not present

## 2023-06-12 DIAGNOSIS — K5903 Drug induced constipation: Secondary | ICD-10-CM | POA: Diagnosis not present

## 2023-06-12 DIAGNOSIS — C9001 Multiple myeloma in remission: Secondary | ICD-10-CM | POA: Diagnosis not present

## 2023-06-12 DIAGNOSIS — G8929 Other chronic pain: Secondary | ICD-10-CM | POA: Diagnosis not present

## 2023-06-12 DIAGNOSIS — Z5112 Encounter for antineoplastic immunotherapy: Secondary | ICD-10-CM | POA: Diagnosis not present

## 2023-06-12 DIAGNOSIS — I10 Essential (primary) hypertension: Secondary | ICD-10-CM | POA: Diagnosis not present

## 2023-06-12 DIAGNOSIS — M25551 Pain in right hip: Secondary | ICD-10-CM | POA: Diagnosis not present

## 2023-06-12 DIAGNOSIS — M25552 Pain in left hip: Secondary | ICD-10-CM | POA: Diagnosis not present

## 2023-06-12 DIAGNOSIS — E611 Iron deficiency: Secondary | ICD-10-CM | POA: Diagnosis not present

## 2023-06-12 DIAGNOSIS — I4891 Unspecified atrial fibrillation: Secondary | ICD-10-CM | POA: Diagnosis not present

## 2023-06-12 DIAGNOSIS — Z9481 Bone marrow transplant status: Secondary | ICD-10-CM | POA: Diagnosis not present

## 2023-06-30 DIAGNOSIS — H43813 Vitreous degeneration, bilateral: Secondary | ICD-10-CM | POA: Diagnosis not present

## 2023-06-30 DIAGNOSIS — H26493 Other secondary cataract, bilateral: Secondary | ICD-10-CM | POA: Diagnosis not present

## 2023-06-30 DIAGNOSIS — Z961 Presence of intraocular lens: Secondary | ICD-10-CM | POA: Diagnosis not present

## 2023-07-13 ENCOUNTER — Other Ambulatory Visit: Payer: Self-pay | Admitting: Cardiology

## 2023-07-15 ENCOUNTER — Other Ambulatory Visit: Payer: Self-pay | Admitting: Cardiology

## 2023-07-15 DIAGNOSIS — I480 Paroxysmal atrial fibrillation: Secondary | ICD-10-CM

## 2023-07-17 DIAGNOSIS — K5903 Drug induced constipation: Secondary | ICD-10-CM | POA: Diagnosis not present

## 2023-07-17 DIAGNOSIS — E611 Iron deficiency: Secondary | ICD-10-CM | POA: Diagnosis not present

## 2023-07-17 DIAGNOSIS — Z9481 Bone marrow transplant status: Secondary | ICD-10-CM | POA: Diagnosis not present

## 2023-07-17 DIAGNOSIS — C9001 Multiple myeloma in remission: Secondary | ICD-10-CM | POA: Diagnosis not present

## 2023-07-17 DIAGNOSIS — M25552 Pain in left hip: Secondary | ICD-10-CM | POA: Diagnosis not present

## 2023-07-17 DIAGNOSIS — D702 Other drug-induced agranulocytosis: Secondary | ICD-10-CM | POA: Diagnosis not present

## 2023-07-17 DIAGNOSIS — G8929 Other chronic pain: Secondary | ICD-10-CM | POA: Diagnosis not present

## 2023-07-24 DIAGNOSIS — Z9481 Bone marrow transplant status: Secondary | ICD-10-CM | POA: Diagnosis not present

## 2023-07-24 DIAGNOSIS — C9001 Multiple myeloma in remission: Secondary | ICD-10-CM | POA: Diagnosis not present

## 2023-08-11 ENCOUNTER — Other Ambulatory Visit: Payer: Self-pay | Admitting: Internal Medicine

## 2023-08-16 ENCOUNTER — Other Ambulatory Visit: Payer: Self-pay | Admitting: Internal Medicine

## 2023-08-28 DIAGNOSIS — D509 Iron deficiency anemia, unspecified: Secondary | ICD-10-CM | POA: Diagnosis not present

## 2023-08-28 DIAGNOSIS — E785 Hyperlipidemia, unspecified: Secondary | ICD-10-CM | POA: Diagnosis not present

## 2023-08-28 DIAGNOSIS — I4891 Unspecified atrial fibrillation: Secondary | ICD-10-CM | POA: Diagnosis not present

## 2023-08-28 DIAGNOSIS — D702 Other drug-induced agranulocytosis: Secondary | ICD-10-CM | POA: Diagnosis not present

## 2023-08-28 DIAGNOSIS — K5900 Constipation, unspecified: Secondary | ICD-10-CM | POA: Diagnosis not present

## 2023-08-28 DIAGNOSIS — C9001 Multiple myeloma in remission: Secondary | ICD-10-CM | POA: Diagnosis not present

## 2023-08-28 DIAGNOSIS — G8929 Other chronic pain: Secondary | ICD-10-CM | POA: Diagnosis not present

## 2023-08-28 DIAGNOSIS — I10 Essential (primary) hypertension: Secondary | ICD-10-CM | POA: Diagnosis not present

## 2023-08-28 DIAGNOSIS — M25552 Pain in left hip: Secondary | ICD-10-CM | POA: Diagnosis not present

## 2023-08-28 DIAGNOSIS — M25551 Pain in right hip: Secondary | ICD-10-CM | POA: Diagnosis not present

## 2023-08-28 DIAGNOSIS — Z23 Encounter for immunization: Secondary | ICD-10-CM | POA: Diagnosis not present

## 2023-08-28 DIAGNOSIS — Z9481 Bone marrow transplant status: Secondary | ICD-10-CM | POA: Diagnosis not present

## 2023-09-04 DIAGNOSIS — Z9481 Bone marrow transplant status: Secondary | ICD-10-CM | POA: Diagnosis not present

## 2023-09-04 DIAGNOSIS — C9001 Multiple myeloma in remission: Secondary | ICD-10-CM | POA: Diagnosis not present

## 2023-09-04 LAB — BASIC METABOLIC PANEL
BUN: 19 (ref 4–21)
CO2: 26 — AB (ref 13–22)
Chloride: 107 (ref 99–108)
Creatinine: 1.2 (ref 0.6–1.3)
Glucose: 87
Potassium: 4.2 meq/L (ref 3.5–5.1)
Sodium: 139 (ref 137–147)

## 2023-09-04 LAB — CBC AND DIFFERENTIAL
HCT: 34 — AB (ref 41–53)
Hemoglobin: 11.1 — AB (ref 13.5–17.5)
Platelets: 145 10*3/uL — AB (ref 150–400)
WBC: 2.9

## 2023-09-04 LAB — HEPATIC FUNCTION PANEL
ALT: 32 U/L (ref 10–40)
AST: 25 (ref 14–40)
Alkaline Phosphatase: 74 (ref 25–125)
Bilirubin, Total: 0.8

## 2023-09-04 LAB — COMPREHENSIVE METABOLIC PANEL
Albumin: 3.6 (ref 3.5–5.0)
Calcium: 8.9 (ref 8.7–10.7)
eGFR: 61

## 2023-09-04 LAB — CBC: RBC: 3.85 — AB (ref 3.87–5.11)

## 2023-09-06 ENCOUNTER — Other Ambulatory Visit: Payer: Self-pay | Admitting: Internal Medicine

## 2023-09-06 MED ORDER — TERAZOSIN HCL 5 MG PO CAPS: 5.0000 mg | ORAL_CAPSULE | Freq: Every day | ORAL | 2 refills | Status: DC

## 2023-09-18 ENCOUNTER — Encounter: Payer: Self-pay | Admitting: Internal Medicine

## 2023-09-18 ENCOUNTER — Ambulatory Visit: Payer: Medicare Other | Admitting: Internal Medicine

## 2023-09-18 VITALS — BP 122/74 | HR 61 | Temp 98.1°F | Ht 75.0 in | Wt 231.6 lb

## 2023-09-18 DIAGNOSIS — I1310 Hypertensive heart and chronic kidney disease without heart failure, with stage 1 through stage 4 chronic kidney disease, or unspecified chronic kidney disease: Secondary | ICD-10-CM | POA: Diagnosis not present

## 2023-09-18 DIAGNOSIS — N1831 Chronic kidney disease, stage 3a: Secondary | ICD-10-CM

## 2023-09-18 DIAGNOSIS — E7800 Pure hypercholesterolemia, unspecified: Secondary | ICD-10-CM | POA: Insufficient documentation

## 2023-09-18 DIAGNOSIS — I480 Paroxysmal atrial fibrillation: Secondary | ICD-10-CM | POA: Diagnosis not present

## 2023-09-18 DIAGNOSIS — C9000 Multiple myeloma not having achieved remission: Secondary | ICD-10-CM | POA: Diagnosis not present

## 2023-09-18 DIAGNOSIS — D6869 Other thrombophilia: Secondary | ICD-10-CM | POA: Diagnosis not present

## 2023-09-18 MED ORDER — LINACLOTIDE 145 MCG PO CAPS: 145.0000 ug | ORAL_CAPSULE | Freq: Every day | ORAL | 2 refills | Status: DC

## 2023-09-18 MED ORDER — FUROSEMIDE 20 MG PO TABS: 20.0000 mg | ORAL_TABLET | Freq: Every day | ORAL | 2 refills | Status: DC

## 2023-09-18 NOTE — Assessment & Plan Note (Signed)
Chronic, well controlled.  He will continue with both amlodipine and furosemide daily. CMP results from Sept 2024 reviewed in Care Everywhere.  Renal function is stable.

## 2023-09-18 NOTE — Patient Instructions (Signed)
Hypertension, Adult Hypertension is another name for high blood pressure. High blood pressure forces your heart to work harder to pump blood. This can cause problems over time. There are two numbers in a blood pressure reading. There is a top number (systolic) over a bottom number (diastolic). It is best to have a blood pressure that is below 120/80. What are the causes? The cause of this condition is not known. Some other conditions can lead to high blood pressure. What increases the risk? Some lifestyle factors can make you more likely to develop high blood pressure: Smoking. Not getting enough exercise or physical activity. Being overweight. Having too much fat, sugar, calories, or salt (sodium) in your diet. Drinking too much alcohol. Other risk factors include: Having any of these conditions: Heart disease. Diabetes. High cholesterol. Kidney disease. Obstructive sleep apnea. Having a family history of high blood pressure and high cholesterol. Age. The risk increases with age. Stress. What are the signs or symptoms? High blood pressure may not cause symptoms. Very high blood pressure (hypertensive crisis) may cause: Headache. Fast or uneven heartbeats (palpitations). Shortness of breath. Nosebleed. Vomiting or feeling like you may vomit (nauseous). Changes in how you see. Very bad chest pain. Feeling dizzy. Seizures. How is this treated? This condition is treated by making healthy lifestyle changes, such as: Eating healthy foods. Exercising more. Drinking less alcohol. Your doctor may prescribe medicine if lifestyle changes do not help enough and if: Your top number is above 130. Your bottom number is above 80. Your personal target blood pressure may vary. Follow these instructions at home: Eating and drinking  If told, follow the DASH eating plan. To follow this plan: Fill one half of your plate at each meal with fruits and vegetables. Fill one fourth of your plate  at each meal with whole grains. Whole grains include whole-wheat pasta, brown rice, and whole-grain bread. Eat or drink low-fat dairy products, such as skim milk or low-fat yogurt. Fill one fourth of your plate at each meal with low-fat (lean) proteins. Low-fat proteins include fish, chicken without skin, eggs, beans, and tofu. Avoid fatty meat, cured and processed meat, or chicken with skin. Avoid pre-made or processed food. Limit the amount of salt in your diet to less than 1,500 mg each day. Do not drink alcohol if: Your doctor tells you not to drink. You are pregnant, may be pregnant, or are planning to become pregnant. If you drink alcohol: Limit how much you have to: 0-1 drink a day for women. 0-2 drinks a day for men. Know how much alcohol is in your drink. In the [place] one drink equals one 12 oz bottle of beer (355 mL), one 5 oz glass of wine (148 mL), or one 1 oz glass of hard liquor (44 mL). Lifestyle  Work with your doctor to stay at a healthy weight or to lose weight. Ask your doctor what the best weight is for you. Get at least 30 minutes of exercise that causes your heart to beat faster (aerobic exercise) most days of the week. This may include walking, swimming, or biking. Get at least 30 minutes of exercise that strengthens your muscles (resistance exercise) at least 3 days a week. This may include lifting weights or doing Pilates. Do not smoke or use any products that contain nicotine or tobacco. If you need help quitting, ask your doctor. Check your blood pressure at home as told by your doctor. Keep all follow-up visits. Medicines Take over-the-counter and prescription medicines   only as told by your doctor. Follow directions carefully. Do not skip doses of blood pressure medicine. The medicine does not work as well if you skip doses. Skipping doses also puts you at risk for problems. Ask your doctor about side effects or reactions to medicines that you should watch  for. Contact a doctor if: You think you are having a reaction to the medicine you are taking. You have headaches that keep coming back. You feel dizzy. You have swelling in your ankles. You have trouble with your vision. Get help right away if: You get a very bad headache. You start to feel mixed up (confused). You feel weak or numb. You feel faint. You have very bad pain in your: Chest. Belly (abdomen). You vomit more than once. You have trouble breathing. These symptoms may be an emergency. Get help right away. Call 911. Do not wait to see if the symptoms will go away. Do not drive yourself to the hospital. Summary Hypertension is another name for high blood pressure. High blood pressure forces your heart to work harder to pump blood. For most people, a normal blood pressure is less than 120/80. Making healthy choices can help lower blood pressure. If your blood pressure does not get lower with healthy choices, you may need to take medicine. This information is not intended to replace advice given to you by your health care provider. Make sure you discuss any questions you have with your health care provider. Document Revised: 09/16/2021 Document Reviewed: 09/16/2021 Elsevier Patient Education  2024 [employer]vier Inc.  

## 2023-09-18 NOTE — Assessment & Plan Note (Signed)
Chronic, LDL goal is < 70 due to coronary calcifications. Jan 2024 results reviewed, LDL at goal.  He will continue with atorvastatin 40mg  daily. He will rto in January 2025 for his next physical examination.

## 2023-09-18 NOTE — Assessment & Plan Note (Signed)
 Chronic, he is encouraged to stay well hydrated, avoid NSAIDs and keep BP controlled to prevent progression of CKD.

## 2023-09-18 NOTE — Progress Notes (Signed)
I,Edward Gallagher, CMA,acting as a Neurosurgeon for Edward Aliment, MD.,have documented all relevant documentation on the behalf of Edward Aliment, MD,as directed by  Edward Aliment, MD while in the presence of Edward Aliment, MD.  Subjective:  Patient ID: Edward Gallagher , male    DOB: 12/15/1940 , 82 y.o.   MRN: [id number]  Chief Complaint  Patient presents with   Hypertension   Hyperlipidemia    HPI  Patient presents today for a BP & chol check, reports compliance with medications. Denies headache, chest pain, and SOB. He reports no specific questions or concerns.      Hypertension This is a chronic problem. The current episode started more than 1 year ago. The problem has been gradually improving since onset. The problem is controlled. Pertinent negatives include no blurred vision, chest pain, palpitations or shortness of breath. Risk factors for coronary artery disease include male gender and obesity. Identifiable causes of hypertension include chronic renal disease.  Hyperlipidemia This is a chronic problem. The current episode started more than 1 year ago. Exacerbating diseases include chronic renal disease. He has no history of obesity. Pertinent negatives include no chest pain or shortness of breath.     Past Medical History:  Diagnosis Date   A-fib [facility])    Allergy    Arthritis    Hyperlipidemia    Hypertension    Multiple myeloma (HCC)    Prostatic hypertrophy    Sinus trouble    Sleep apnea    CPAP   Wears dentures    full upper, partial lower     Family History  Problem Relation Age of Onset   Healthy Mother    Prostate cancer Father    Cancer Father    Testicular cancer Brother    Hypertension Brother    Prostate cancer Brother    Lung cancer Brother    Cancer Brother    Cancer Brother        double mynoma   Hypertension Other      Current Outpatient Medications:    acyclovir (ZOVIRAX) 400 MG tablet, Take 400 mg by mouth 2 (two) times daily., Disp:  , Rfl:    amLODipine (NORVASC) 2.5 MG tablet, TAKE 1 TABLET(2.5 MG) BY MOUTH DAILY, Disp: 90 tablet, Rfl: 1   atorvastatin (LIPITOR) 40 MG tablet, Take 1 tablet (40 mg total) by mouth daily., Disp: 90 tablet, Rfl: 3   cetirizine (ZYRTEC) 10 MG tablet, Take 10 mg by mouth daily., Disp: , Rfl:    ELIQUIS 5 MG TABS tablet, TAKE 1 TABLET BY MOUTH TWICE  DAILY, Disp: 200 tablet, Rfl: 2   finasteride (PROSCAR) 5 MG tablet, Take 5 mg by mouth daily., Disp: , Rfl:    fluticasone (FLONASE) 50 MCG/ACT nasal spray, SHAKE LIQUID AND USE 1 SPRAY IN EACH NOSTRIL DAILY, Disp: 16 g, Rfl: 2   lenalidomide (REVLIMID) 5 MG capsule, Take 5 mg by mouth daily., Disp: , Rfl:    Misc Natural Products (NEURIVA PO), Take by mouth at bedtime., Disp: , Rfl:    terazosin (HYTRIN) 5 MG capsule, Take 1 capsule (5 mg total) by mouth at bedtime., Disp: 90 capsule, Rfl: 2   triamcinolone (NASACORT) 55 MCG/ACT AERO nasal inhaler, Place 2 sprays into the nose daily., Disp: , Rfl:    Xylitol (XYLIMELTS MT), Use as directed in the mouth or throat at bedtime., Disp: , Rfl:    furosemide (LASIX) 20 MG tablet, Take 1 tablet (20 mg  total) by mouth daily., Disp: 90 tablet, Rfl: 2   linaclotide (LINZESS) 145 MCG CAPS capsule, Take 1 capsule (145 mcg total) by mouth daily before breakfast., Disp: 90 capsule, Rfl: 2   nitroGLYCERIN (NITROSTAT) 0.4 MG SL tablet, Place 1 tablet (0.4 mg total) under the tongue every 5 (five) minutes as needed for chest pain., Disp: 100 tablet, Rfl: 3   No Known Allergies   Review of Systems  Constitutional: Negative.   HENT: Negative.    Eyes: Negative.  Negative for blurred vision.  Respiratory: Negative.  Negative for shortness of breath.   Cardiovascular: Negative.  Negative for chest pain and palpitations.  Skin: Negative.      Today's Vitals   09/18/23 0834  BP: 122/74  Pulse: 61  Temp: 98.1 F (36.7 C)  SpO2: 98%  Weight: 231 lb 9.6 oz (105.1 kg)  Height: 6\' 3"  (1.905 m)   Body mass index  is 28.95 kg/m.  Wt Readings from Last 3 Encounters:  09/18/23 231 lb 9.6 oz (105.1 kg)  04/13/23 228 lb 3.2 oz (103.5 kg)  03/29/23 230 lb (104.3 kg)     Objective:  Physical Exam Vitals and nursing note reviewed.  Constitutional:      Appearance: Normal appearance.  HENT:     Head: Normocephalic and atraumatic.  Eyes:     Extraocular Movements: Extraocular movements intact.  Cardiovascular:     Rate and Rhythm: Normal rate and regular rhythm.     Heart sounds: Normal heart sounds.  Pulmonary:     Effort: Pulmonary effort is normal.     Breath sounds: Normal breath sounds.  Musculoskeletal:     Cervical back: Normal range of motion.  Skin:    General: Skin is warm.  Neurological:     General: No focal deficit present.     Mental Status: He is alert.  Psychiatric:        Mood and Affect: Mood normal.         Assessment And Plan:  Hypertensive heart and renal disease with renal failure, stage 1 through stage 4 or unspecified chronic kidney disease, without heart failure Assessment & Plan: Chronic, well controlled.  He will continue with both amlodipine and furosemide daily. CMP results from Sept 2024 reviewed in Care Everywhere.  Renal function is stable.    Stage 3a chronic kidney disease (HCC) Assessment & Plan: Chronic, he is encouraged to stay well hydrated, avoid NSAIDs and keep BP controlled to prevent progression of CKD.     Pure hypercholesterolemia Assessment & Plan: Chronic, LDL goal is < 70 due to coronary calcifications. Jan 2024 results reviewed, LDL at goal.  He will continue with atorvastatin 40mg  daily. He will rto in January 2025 for his next physical examination.    Multiple myeloma, remission status unspecified (HCC) Assessment & Plan: He is s/p stem cell transplant. Currently on Revlimid. Most recent Oncology notes reviewed in Care Everywhere.     Paroxysmal atrial fibrillation (HCC) Assessment & Plan: Chronic, currently rate controlled  and properly anticoagulated.    Acquired thrombophilia (HCC) Assessment & Plan: Chronic, currently on Eliquis due to underlying PAF.    Other orders -     linaCLOtide; Take 1 capsule (145 mcg total) by mouth daily before breakfast.  Dispense: 90 capsule; Refill: 2 -     Furosemide; Take 1 tablet (20 mg total) by mouth daily.  Dispense: 90 tablet; Refill: 2     Return if symptoms worsen or fail to improve.  Patient was given opportunity to ask questions. Patient verbalized understanding of the plan and was able to repeat key elements of the plan. All questions were answered to their satisfaction.    I, Edward Aliment, MD, have reviewed all documentation for this visit. The documentation on 09/18/23 for the exam, diagnosis, procedures, and orders are all accurate and complete.   IF YOU HAVE BEEN REFERRED TO A SPECIALIST, IT MAY TAKE 1-2 WEEKS TO SCHEDULE/PROCESS THE REFERRAL. IF YOU HAVE NOT HEARD FROM US/SPECIALIST IN TWO WEEKS, PLEASE GIVE [place] A CALL AT [phone].   THE PATIENT IS ENCOURAGED TO PRACTICE SOCIAL DISTANCING DUE TO THE COVID-19 PANDEMIC.

## 2023-09-18 NOTE — Assessment & Plan Note (Signed)
He is s/p stem cell transplant. Currently on Revlimid. Most recent Oncology notes reviewed in Care Everywhere.

## 2023-09-18 NOTE — Assessment & Plan Note (Signed)
Chronic, currently rate controlled and properly anticoagulated.

## 2023-09-18 NOTE — Assessment & Plan Note (Signed)
Chronic, currently on Eliquis due to underlying PAF.

## 2023-10-14 DIAGNOSIS — M545 Low back pain, unspecified: Secondary | ICD-10-CM | POA: Diagnosis not present

## 2023-10-17 DIAGNOSIS — Z5112 Encounter for antineoplastic immunotherapy: Secondary | ICD-10-CM | POA: Diagnosis not present

## 2023-10-17 DIAGNOSIS — Z23 Encounter for immunization: Secondary | ICD-10-CM | POA: Diagnosis not present

## 2023-10-17 DIAGNOSIS — M25552 Pain in left hip: Secondary | ICD-10-CM | POA: Diagnosis not present

## 2023-10-17 DIAGNOSIS — K5900 Constipation, unspecified: Secondary | ICD-10-CM | POA: Diagnosis not present

## 2023-10-17 DIAGNOSIS — Z9481 Bone marrow transplant status: Secondary | ICD-10-CM | POA: Diagnosis not present

## 2023-10-17 DIAGNOSIS — I10 Essential (primary) hypertension: Secondary | ICD-10-CM | POA: Diagnosis not present

## 2023-10-17 DIAGNOSIS — D509 Iron deficiency anemia, unspecified: Secondary | ICD-10-CM | POA: Diagnosis not present

## 2023-10-17 DIAGNOSIS — G893 Neoplasm related pain (acute) (chronic): Secondary | ICD-10-CM | POA: Diagnosis not present

## 2023-10-17 DIAGNOSIS — E785 Hyperlipidemia, unspecified: Secondary | ICD-10-CM | POA: Diagnosis not present

## 2023-10-17 DIAGNOSIS — M25551 Pain in right hip: Secondary | ICD-10-CM | POA: Diagnosis not present

## 2023-10-17 DIAGNOSIS — C9001 Multiple myeloma in remission: Secondary | ICD-10-CM | POA: Diagnosis not present

## 2023-11-20 DIAGNOSIS — C9001 Multiple myeloma in remission: Secondary | ICD-10-CM | POA: Diagnosis not present

## 2023-11-20 DIAGNOSIS — M25552 Pain in left hip: Secondary | ICD-10-CM | POA: Diagnosis not present

## 2023-11-20 DIAGNOSIS — M25551 Pain in right hip: Secondary | ICD-10-CM | POA: Diagnosis not present

## 2023-11-20 DIAGNOSIS — I10 Essential (primary) hypertension: Secondary | ICD-10-CM | POA: Diagnosis not present

## 2023-11-20 DIAGNOSIS — E785 Hyperlipidemia, unspecified: Secondary | ICD-10-CM | POA: Diagnosis not present

## 2023-11-20 DIAGNOSIS — G893 Neoplasm related pain (acute) (chronic): Secondary | ICD-10-CM | POA: Diagnosis not present

## 2023-11-20 DIAGNOSIS — K5900 Constipation, unspecified: Secondary | ICD-10-CM | POA: Diagnosis not present

## 2023-11-20 DIAGNOSIS — M545 Low back pain, unspecified: Secondary | ICD-10-CM | POA: Diagnosis not present

## 2023-11-20 DIAGNOSIS — D509 Iron deficiency anemia, unspecified: Secondary | ICD-10-CM | POA: Diagnosis not present

## 2023-11-20 DIAGNOSIS — Z9481 Bone marrow transplant status: Secondary | ICD-10-CM | POA: Diagnosis not present

## 2023-11-20 DIAGNOSIS — D709 Neutropenia, unspecified: Secondary | ICD-10-CM | POA: Diagnosis not present

## 2023-11-27 DIAGNOSIS — C9001 Multiple myeloma in remission: Secondary | ICD-10-CM | POA: Diagnosis not present

## 2023-11-27 DIAGNOSIS — Z9481 Bone marrow transplant status: Secondary | ICD-10-CM | POA: Diagnosis not present

## 2024-01-03 ENCOUNTER — Encounter: Payer: Medicare Other | Admitting: Internal Medicine

## 2024-01-03 NOTE — Progress Notes (Deleted)
I,Linnie Mcglocklin T Deloria Lair, CMA,acting as a Neurosurgeon for [name], MD.,have documented all relevant documentation on the behalf of [name], MD,as directed by  [name], MD while in the presence of [name], MD.  Subjective:   Patient ID: Edward Gallagher , male    DOB: February 12, 1941 , 83 y.o.   MRN: [id number]  No chief complaint on file.   HPI  He is here today for a full physical examination. He is followed by Urology for his prostate exams. He has no specific concerns at this time.  He denies headaches, chest pain and visual disturbances. He is under active treatment plan at [facility] for multiple myeloma.    Hypertension This is a chronic problem. The current episode started more than 1 year ago. The problem has been gradually improving since onset. The problem is uncontrolled. Pertinent negatives include no blurred vision. Risk factors for coronary artery disease include male gender. Past treatments include lifestyle changes and diuretics. The current treatment provides moderate improvement.     Past Medical History:  Diagnosis Date   A-fib [facility])    Allergy    Arthritis    Hyperlipidemia    Hypertension    Multiple myeloma (HCC)    Prostatic hypertrophy    Sinus trouble    Sleep apnea    CPAP   Wears dentures    full upper, partial lower     Family History  Problem Relation Age of Onset   Healthy Mother    Prostate cancer Father    Cancer Father    Testicular cancer Brother    Hypertension Brother    Prostate cancer Brother    Lung cancer Brother    Cancer Brother    Cancer Brother        double mynoma   Hypertension Other      Current Outpatient Medications:    acyclovir (ZOVIRAX) 400 MG tablet, Take 400 mg by mouth 2 (two) times daily., Disp: , Rfl:    amLODipine (NORVASC) 2.5 MG tablet, TAKE 1 TABLET(2.5 MG) BY MOUTH DAILY, Disp: 90 tablet, Rfl: 1   atorvastatin (LIPITOR) 40 MG tablet, Take 1 tablet (40 mg total) by mouth daily., Disp: 90 tablet,  Rfl: 3   cetirizine (ZYRTEC) 10 MG tablet, Take 10 mg by mouth daily., Disp: , Rfl:    ELIQUIS 5 MG TABS tablet, TAKE 1 TABLET BY MOUTH TWICE  DAILY, Disp: 200 tablet, Rfl: 2   finasteride (PROSCAR) 5 MG tablet, Take 5 mg by mouth daily., Disp: , Rfl:    fluticasone (FLONASE) 50 MCG/ACT nasal spray, SHAKE LIQUID AND USE 1 SPRAY IN EACH NOSTRIL DAILY, Disp: 16 g, Rfl: 2   furosemide (LASIX) 20 MG tablet, Take 1 tablet (20 mg total) by mouth daily., Disp: 90 tablet, Rfl: 2   lenalidomide (REVLIMID) 5 MG capsule, Take 5 mg by mouth daily., Disp: , Rfl:    linaclotide (LINZESS) 145 MCG CAPS capsule, Take 1 capsule (145 mcg total) by mouth daily before breakfast., Disp: 90 capsule, Rfl: 2   Misc Natural Products (NEURIVA PO), Take by mouth at bedtime., Disp: , Rfl:    nitroGLYCERIN (NITROSTAT) 0.4 MG SL tablet, Place 1 tablet (0.4 mg total) under the tongue every 5 (five) minutes as needed for chest pain., Disp: 100 tablet, Rfl: 3   terazosin (HYTRIN) 5 MG capsule, Take 1 capsule (5 mg total) by mouth at bedtime., Disp: 90 capsule, Rfl: 2   triamcinolone (NASACORT) 55 MCG/ACT  AERO nasal inhaler, Place 2 sprays into the nose daily., Disp: , Rfl:    Xylitol (XYLIMELTS MT), Use as directed in the mouth or throat at bedtime., Disp: , Rfl:    No Known Allergies   Men's preventive visit. Patient Health Questionnaire (PHQ-2) is  Flowsheet Row Office Visit from 09/18/2023 in [facility]  PHQ-2 Total Score 0     . Patient is on a *** diet. Marital status: Married. Relevant history for alcohol use is:  Social History   Substance and Sexual Activity  Alcohol Use Not Currently   Comment: ocasional beer  . Relevant history for tobacco use is:  Social History   Tobacco Use  Smoking Status Former   Current packs/day: 0.00   Average packs/day: 0.5 packs/day for 35.0 years (17.5 ttl pk-yrs)   Types: Cigarettes   Start date: 12/12/1962   Quit date: 12/12/1997   Years since  quitting: 26.0  Smokeless Tobacco Never  Tobacco Comments   I quit in Sept , 1999  .   Review of Systems  Constitutional: Negative.   HENT: Negative.    Eyes:  Negative for blurred vision.  Cardiovascular: Negative.   Endocrine: Negative.   Genitourinary: Negative.   Skin: Negative.   Allergic/Immunologic: Negative.   Neurological: Negative.   Hematological: Negative.      There were no vitals filed for this visit. There is no height or weight on file to calculate BMI.  Wt Readings from Last 3 Encounters:  09/18/23 231 lb 9.6 oz (105.1 kg)  04/13/23 228 lb 3.2 oz (103.5 kg)  03/29/23 230 lb (104.3 kg)    Objective:  Physical Exam      Assessment And Plan:    Encounter for general adult medical examination w/o abnormal findings  Hypertensive heart and renal disease with renal failure, stage 1 through stage 4 or unspecified chronic kidney disease, without heart failure  Pure hypercholesterolemia  Stage 3a chronic kidney disease (HCC)  Multiple myeloma, remission status unspecified (HCC)     No follow-ups on file. Patient was given opportunity to ask questions. Patient verbalized understanding of the plan and was able to repeat key elements of the plan. All questions were answered to their satisfaction.   [name], MD  I, [name], MD, have reviewed all documentation for this visit. The documentation on 01/03/24 for the exam, diagnosis, procedures, and orders are all accurate and complete.

## 2024-01-08 DIAGNOSIS — M545 Low back pain, unspecified: Secondary | ICD-10-CM | POA: Diagnosis not present

## 2024-01-08 DIAGNOSIS — M25552 Pain in left hip: Secondary | ICD-10-CM | POA: Diagnosis not present

## 2024-01-08 DIAGNOSIS — I10 Essential (primary) hypertension: Secondary | ICD-10-CM | POA: Diagnosis not present

## 2024-01-08 DIAGNOSIS — Z9481 Bone marrow transplant status: Secondary | ICD-10-CM | POA: Diagnosis not present

## 2024-01-08 DIAGNOSIS — D649 Anemia, unspecified: Secondary | ICD-10-CM | POA: Diagnosis not present

## 2024-01-08 DIAGNOSIS — Z5112 Encounter for antineoplastic immunotherapy: Secondary | ICD-10-CM | POA: Diagnosis not present

## 2024-01-08 DIAGNOSIS — E785 Hyperlipidemia, unspecified: Secondary | ICD-10-CM | POA: Diagnosis not present

## 2024-01-08 DIAGNOSIS — K5900 Constipation, unspecified: Secondary | ICD-10-CM | POA: Diagnosis not present

## 2024-01-08 DIAGNOSIS — M5440 Lumbago with sciatica, unspecified side: Secondary | ICD-10-CM | POA: Diagnosis not present

## 2024-01-08 DIAGNOSIS — G893 Neoplasm related pain (acute) (chronic): Secondary | ICD-10-CM | POA: Diagnosis not present

## 2024-01-08 DIAGNOSIS — M25551 Pain in right hip: Secondary | ICD-10-CM | POA: Diagnosis not present

## 2024-01-08 DIAGNOSIS — I480 Paroxysmal atrial fibrillation: Secondary | ICD-10-CM | POA: Diagnosis not present

## 2024-01-08 DIAGNOSIS — Z7901 Long term (current) use of anticoagulants: Secondary | ICD-10-CM | POA: Diagnosis not present

## 2024-01-08 DIAGNOSIS — Z79899 Other long term (current) drug therapy: Secondary | ICD-10-CM | POA: Diagnosis not present

## 2024-01-08 DIAGNOSIS — Z8639 Personal history of other endocrine, nutritional and metabolic disease: Secondary | ICD-10-CM | POA: Diagnosis not present

## 2024-01-08 DIAGNOSIS — C9001 Multiple myeloma in remission: Secondary | ICD-10-CM | POA: Diagnosis not present

## 2024-01-08 DIAGNOSIS — D509 Iron deficiency anemia, unspecified: Secondary | ICD-10-CM | POA: Diagnosis not present

## 2024-01-10 ENCOUNTER — Ambulatory Visit: Payer: Medicare Other | Admitting: Family Medicine

## 2024-01-10 ENCOUNTER — Encounter: Payer: Self-pay | Admitting: Family Medicine

## 2024-01-10 VITALS — BP 110/70 | HR 78 | Temp 98.1°F | Wt 236.0 lb

## 2024-01-10 DIAGNOSIS — N183 Chronic kidney disease, stage 3 unspecified: Secondary | ICD-10-CM

## 2024-01-10 DIAGNOSIS — C9001 Multiple myeloma in remission: Secondary | ICD-10-CM | POA: Diagnosis not present

## 2024-01-10 DIAGNOSIS — I1310 Hypertensive heart and chronic kidney disease without heart failure, with stage 1 through stage 4 chronic kidney disease, or unspecified chronic kidney disease: Secondary | ICD-10-CM | POA: Diagnosis not present

## 2024-01-10 DIAGNOSIS — C9000 Multiple myeloma not having achieved remission: Secondary | ICD-10-CM

## 2024-01-10 DIAGNOSIS — R7309 Other abnormal glucose: Secondary | ICD-10-CM | POA: Diagnosis not present

## 2024-01-10 DIAGNOSIS — D6869 Other thrombophilia: Secondary | ICD-10-CM

## 2024-01-10 DIAGNOSIS — I129 Hypertensive chronic kidney disease with stage 1 through stage 4 chronic kidney disease, or unspecified chronic kidney disease: Secondary | ICD-10-CM

## 2024-01-10 DIAGNOSIS — Z0000 Encounter for general adult medical examination without abnormal findings: Secondary | ICD-10-CM | POA: Diagnosis not present

## 2024-01-10 DIAGNOSIS — N1831 Chronic kidney disease, stage 3a: Secondary | ICD-10-CM

## 2024-01-10 DIAGNOSIS — E7800 Pure hypercholesterolemia, unspecified: Secondary | ICD-10-CM | POA: Diagnosis not present

## 2024-01-10 DIAGNOSIS — I480 Paroxysmal atrial fibrillation: Secondary | ICD-10-CM

## 2024-01-10 DIAGNOSIS — R7303 Prediabetes: Secondary | ICD-10-CM | POA: Diagnosis not present

## 2024-01-10 LAB — POCT URINALYSIS DIPSTICK
Bilirubin, UA: NEGATIVE
Blood, UA: NEGATIVE
Glucose, UA: NEGATIVE
Ketones, UA: NEGATIVE
Leukocytes, UA: NEGATIVE
Nitrite, UA: NEGATIVE
Protein, UA: NEGATIVE
Spec Grav, UA: 1.02 (ref 1.010–1.025)
Urobilinogen, UA: 0.2 U/dL
pH, UA: 6 (ref 5.0–8.0)

## 2024-01-10 NOTE — Progress Notes (Signed)
I,Jameka J Llittleton, CMA,acting as a Neurosurgeon for [employer], NP.,have documented all relevant documentation on the behalf of Ellender Hose, NP,as directed by  Ellender Hose, NP while in the presence of Ellender Hose, NP.  Subjective:   Patient ID: Edward Gallagher , male    DOB: 07-Feb-1941 , 83 y.o.   MRN: [id number]  Chief Complaint  Patient presents with   Annual Exam    HPI  Patient is a 83 year old male who presents today for his annual physical  examination.  Patient was diagnosed with multiple myeloma in 2022, he got treated and is currently in remission but he goes to [facility] in [place] , [place] monthly for check ups.  Patient states he exercises daily by walking and plays golf 2-3 times a week. Patient states he sees Dr Annabell Howells Urologist who checks his PSA yearly, prostrate examination, states his next appointment is in March,2025. He reports that he will be getting Iron infusion at his next appointment,due to anemia. Patient is followed by Dr Truett Mainland, Cardiology and he is on Eliquis 5 mg BID for PAF,Hypertension. He denies chest pain, palpitations, shortness of breath or headaches. He has no specific questions at this time.     Past Medical History:  Diagnosis Date   A-fib [facility])    Allergy    Arthritis    Benign hypertension with CKD (chronic kidney disease) stage III (HCC) 01/15/2024   Encounter for general adult medical examination w/o abnormal findings 01/15/2024   Hyperlipidemia    Hypertension    Multiple myeloma (HCC)    Prostatic hypertrophy    Sinus trouble    Sleep apnea    CPAP   Wears dentures    full upper, partial lower     Family History  Problem Relation Age of Onset   Healthy Mother    Prostate cancer Father    Cancer Father    Testicular cancer Brother    Hypertension Brother    Prostate cancer Brother    Lung cancer Brother    Cancer Brother    Cancer Brother        double mynoma   Hypertension Other      Current Outpatient Medications:     acyclovir (ZOVIRAX) 400 MG tablet, Take 400 mg by mouth 2 (two) times daily., Disp: , Rfl:    amLODipine (NORVASC) 2.5 MG tablet, TAKE 1 TABLET(2.5 MG) BY MOUTH DAILY, Disp: 90 tablet, Rfl: 1   atorvastatin (LIPITOR) 40 MG tablet, Take 1 tablet (40 mg total) by mouth daily., Disp: 90 tablet, Rfl: 3   cetirizine (ZYRTEC) 10 MG tablet, Take 10 mg by mouth daily., Disp: , Rfl:    ELIQUIS 5 MG TABS tablet, TAKE 1 TABLET BY MOUTH TWICE  DAILY, Disp: 200 tablet, Rfl: 2   finasteride (PROSCAR) 5 MG tablet, Take 5 mg by mouth daily., Disp: , Rfl:    fluticasone (FLONASE) 50 MCG/ACT nasal spray, SHAKE LIQUID AND USE 1 SPRAY IN EACH NOSTRIL DAILY, Disp: 16 g, Rfl: 2   furosemide (LASIX) 20 MG tablet, Take 1 tablet (20 mg total) by mouth daily., Disp: 90 tablet, Rfl: 2   lenalidomide (REVLIMID) 5 MG capsule, Take 5 mg by mouth daily., Disp: , Rfl:    linaclotide (LINZESS) 145 MCG CAPS capsule, Take 1 capsule (145 mcg total) by mouth daily before breakfast., Disp: 90 capsule, Rfl: 2   Misc Natural Products (NEURIVA PO), Take by mouth at bedtime., Disp: , Rfl:  terazosin (HYTRIN) 5 MG capsule, Take 1 capsule (5 mg total) by mouth at bedtime., Disp: 90 capsule, Rfl: 2   triamcinolone (NASACORT) 55 MCG/ACT AERO nasal inhaler, Place 2 sprays into the nose daily., Disp: , Rfl:    Xylitol (XYLIMELTS MT), Use as directed in the mouth or throat at bedtime., Disp: , Rfl:    nitroGLYCERIN (NITROSTAT) 0.4 MG SL tablet, Place 1 tablet (0.4 mg total) under the tongue every 5 (five) minutes as needed for chest pain., Disp: 100 tablet, Rfl: 3   No Known Allergies    Flowsheet Row Office Visit from 01/10/2024 in [facility]  PHQ-2 Total Score 0      Social History   Substance and Sexual Activity  Alcohol Use Not Currently   Comment: ocasional beer   Social History   Tobacco Use  Smoking Status Former   Current packs/day: 0.00   Average packs/day: 0.5 packs/day for 35.0  years (17.5 ttl pk-yrs)   Types: Cigarettes   Start date: 12/12/1962   Quit date: 12/12/1997   Years since quitting: 26.1  Smokeless Tobacco Never  Tobacco Comments   I quit in Sept , 1999  .   Review of Systems  Constitutional: Negative.   HENT: Negative.    Eyes: Negative.   Respiratory: Negative.    Cardiovascular: Negative.  Negative for chest pain, palpitations and leg swelling.  Gastrointestinal: Negative.   Endocrine: Negative.   Genitourinary: Negative.   Musculoskeletal: Negative.   Skin: Negative.   Neurological: Negative.   Hematological: Negative.   Psychiatric/Behavioral: Negative.       Today's Vitals   01/10/24 0851  BP: 110/70  Pulse: 78  Temp: 98.1 F (36.7 C)  TempSrc: Oral  Weight: 236 lb (107 kg)  PainSc: 2   PainLoc: Back   Body mass index is 29.5 kg/m.  Wt Readings from Last 3 Encounters:  01/10/24 236 lb (107 kg)  09/18/23 231 lb 9.6 oz (105.1 kg)  04/13/23 228 lb 3.2 oz (103.5 kg)    Objective:  Physical Exam HENT:     Head: Normocephalic.  Cardiovascular:     Rate and Rhythm: Bradycardia present.     Heart sounds: No murmur heard. Abdominal:     Tenderness: There is no abdominal tenderness. There is no guarding.     Hernia: No hernia is present.  Skin:    General: Skin is warm and dry.  Neurological:     Mental Status: He is alert and oriented to person, place, and time.  Psychiatric:        Mood and Affect: Mood normal.         Assessment And Plan:    Encounter for general adult medical examination w/o abnormal findings  Pure hypercholesterolemia Assessment & Plan: Low fat diet advised, continue Lipitor 40 mg every day   Orders: -     Lipid panel  Acquired thrombophilia (HCC) Assessment & Plan: Chronic and stable, on eliquis 5 mg BID, s/p PAF. EKG = sinus bradycardia   Multiple myeloma in remission [facility]) Assessment & Plan: S/p stem transplant; followed by [facility]. Currently on Revlimid 5 mg every day.  Continue treatment plan.   Benign hypertension with CKD (chronic kidney disease) stage III (HCC) Assessment & Plan: Encouraged to keep BP well controlled and avoid use of NSAIDs   Orders: -     EKG 12-Lead -     POCT urinalysis dipstick -     Microalbumin / creatinine  urine ratio  Prediabetes Assessment & Plan: Check A1c, low carb diet advised.  Orders: -     Hemoglobin A1c     Return in 6 months (on 07/09/2024) for 1 year physical, blood pressure. Patient was given opportunity to ask questions. Patient verbalized understanding of the plan and was able to repeat key elements of the plan. All questions were answered to their satisfaction.    I, Ellender Hose, NP, have reviewed all documentation for this visit. The documentation on 01/15/2024 for the exam, diagnosis, procedures, and orders are all accurate and complete.

## 2024-01-10 NOTE — Patient Instructions (Signed)
Health Maintenance, Male  Adopting a healthy lifestyle and getting preventive care are important in promoting health and wellness. Ask your health care provider about:  The right schedule for you to have regular tests and exams.  Things you can do on your own to prevent diseases and keep yourself healthy.  What should I know about diet, weight, and exercise?  Eat a healthy diet    Eat a diet that includes plenty of vegetables, fruits, low-fat dairy products, and lean protein.  Do not eat a lot of foods that are high in solid fats, added sugars, or sodium.  Maintain a healthy weight  Body mass index (BMI) is a measurement that can be used to identify possible weight problems. It estimates body fat based on height and weight. Your health care provider can help determine your BMI and help you achieve or maintain a healthy weight.  Get regular exercise  Get regular exercise. This is one of the most important things you can do for your health. Most adults should:  Exercise for at least 150 minutes each week. The exercise should increase your heart rate and make you sweat (moderate-intensity exercise).  Do strengthening exercises at least twice a week. This is in addition to the moderate-intensity exercise.  Spend less time sitting. Even light physical activity can be beneficial.  Watch cholesterol and blood lipids  Have your blood tested for lipids and cholesterol at 82 years of age, then have this test every 5 years.  You may need to have your cholesterol levels checked more often if:  Your lipid or cholesterol levels are high.  You are older than 82 years of age.  You are at high risk for heart disease.  What should I know about cancer screening?  Many types of cancers can be detected early and may often be prevented. Depending on your health history and family history, you may need to have cancer screening at various ages. This may include screening for:  Colorectal cancer.  Prostate cancer.  Skin cancer.  Lung  cancer.  What should I know about heart disease, diabetes, and high blood pressure?  Blood pressure and heart disease  High blood pressure causes heart disease and increases the risk of stroke. This is more likely to develop in people who have high blood pressure readings or are overweight.  Talk with your health care provider about your target blood pressure readings.  Have your blood pressure checked:  Every 3-5 years if you are 1-31 years of age.  Every year if you are 28 years old or older.  If you are between the ages of 29 and 4 and are a current or former smoker, ask your health care provider if you should have a one-time screening for abdominal aortic aneurysm (AAA).  Diabetes  Have regular diabetes screenings. This checks your fasting blood sugar level. Have the screening done:  Once every three years after age 40 if you are at a normal weight and have a low risk for diabetes.  More often and at a younger age if you are overweight or have a high risk for diabetes.  What should I know about preventing infection?  Hepatitis B  If you have a higher risk for hepatitis B, you should be screened for this virus. Talk with your health care provider to find out if you are at risk for hepatitis B infection.  Hepatitis C  Blood testing is recommended for:  Everyone born from [birth year] through 1964.  Anyone  with known risk factors for hepatitis C.  Sexually transmitted infections (STIs)  You should be screened each year for STIs, including gonorrhea and chlamydia, if:  You are sexually active and are younger than 82 years of age.  You are older than 82 years of age and your health care provider tells you that you are at risk for this type of infection.  Your sexual activity has changed since you were last screened, and you are at increased risk for chlamydia or gonorrhea. Ask your health care provider if you are at risk.  Ask your health care provider about whether you are at high risk for HIV. Your health care provider  may recommend a prescription medicine to help prevent HIV infection. If you choose to take medicine to prevent HIV, you should first get tested for HIV. You should then be tested every 3 months for as long as you are taking the medicine.  Follow these instructions at home:  Alcohol use  Do not drink alcohol if your health care provider tells you not to drink.  If you drink alcohol:  Limit how much you have to 0-2 drinks a day.  Know how much alcohol is in your drink. In the [place] one drink equals one 12 oz bottle of beer (355 mL), one 5 oz glass of wine (148 mL), or one 1 oz glass of hard liquor (44 mL).  Lifestyle  Do not use any products that contain nicotine or tobacco. These products include cigarettes, chewing tobacco, and vaping devices, such as e-cigarettes. If you need help quitting, ask your health care provider.  Do not use street drugs.  Do not share needles.  Ask your health care provider for help if you need support or information about quitting drugs.  General instructions  Schedule regular health, dental, and eye exams.  Stay current with your vaccines.  Tell your health care provider if:  You often feel depressed.  You have ever been abused or do not feel safe at home.  Summary  Adopting a healthy lifestyle and getting preventive care are important in promoting health and wellness.  Follow your health care provider's instructions about healthy diet, exercising, and getting tested or screened for diseases.  Follow your health care provider's instructions on monitoring your cholesterol and blood pressure.  This information is not intended to replace advice given to you by your health care provider. Make sure you discuss any questions you have with your health care provider.  Document Revised: 04/19/2021 Document Reviewed: 04/19/2021  Elsevier Patient Education  2024 [employer].

## 2024-01-11 LAB — LIPID PANEL
Chol/HDL Ratio: 2.3 {ratio} (ref 0.0–5.0)
Cholesterol, Total: 124 mg/dL (ref 100–199)
HDL: 54 mg/dL (ref >39)
LDL Chol Calc (NIH): 58 mg/dL (ref 0–99)
Triglycerides: 54 mg/dL (ref 0–149)
VLDL Cholesterol Cal: 12 mg/dL (ref 5–40)

## 2024-01-11 LAB — MICROALBUMIN / CREATININE URINE RATIO
Creatinine, Urine: 100 mg/dL
Microalb/Creat Ratio: 5 mg/g{creat} (ref 0–29)
Microalbumin, Urine: 4.7 ug/mL

## 2024-01-11 LAB — HEMOGLOBIN A1C
Est. average glucose Bld gHb Est-mCnc: 120 mg/dL
Hgb A1c MFr Bld: 5.8 % — ABNORMAL HIGH (ref 4.8–5.6)

## 2024-01-15 ENCOUNTER — Encounter: Payer: Self-pay | Admitting: Family Medicine

## 2024-01-15 ENCOUNTER — Other Ambulatory Visit: Payer: Self-pay | Admitting: Internal Medicine

## 2024-01-15 DIAGNOSIS — E782 Mixed hyperlipidemia: Secondary | ICD-10-CM

## 2024-01-15 DIAGNOSIS — R7303 Prediabetes: Secondary | ICD-10-CM | POA: Insufficient documentation

## 2024-01-15 DIAGNOSIS — N183 Chronic kidney disease, stage 3 unspecified: Secondary | ICD-10-CM

## 2024-01-15 DIAGNOSIS — Z0000 Encounter for general adult medical examination without abnormal findings: Secondary | ICD-10-CM

## 2024-01-15 DIAGNOSIS — I129 Hypertensive chronic kidney disease with stage 1 through stage 4 chronic kidney disease, or unspecified chronic kidney disease: Secondary | ICD-10-CM | POA: Insufficient documentation

## 2024-01-15 HISTORY — DX: Encounter for general adult medical examination without abnormal findings: Z00.00

## 2024-01-15 HISTORY — DX: Chronic kidney disease, stage 3 unspecified: N18.30

## 2024-01-15 NOTE — Assessment & Plan Note (Signed)
Encouraged to keep BP well controlled and avoid use of NSAIDs

## 2024-01-15 NOTE — Assessment & Plan Note (Signed)
Low fat diet advised, continue Lipitor 40 mg every day

## 2024-01-15 NOTE — Assessment & Plan Note (Signed)
S/p stem transplant; followed by [facility]. Currently on Revlimid 5 mg every day. Continue treatment plan.

## 2024-01-15 NOTE — Assessment & Plan Note (Signed)
Check A1c, low carb diet advised.

## 2024-01-15 NOTE — Assessment & Plan Note (Addendum)
Chronic and stable, on eliquis 5 mg BID, s/p PAF. EKG = sinus bradycardia

## 2024-01-26 DIAGNOSIS — M545 Low back pain, unspecified: Secondary | ICD-10-CM | POA: Diagnosis not present

## 2024-01-26 DIAGNOSIS — M899 Disorder of bone, unspecified: Secondary | ICD-10-CM | POA: Diagnosis not present

## 2024-01-26 DIAGNOSIS — M47816 Spondylosis without myelopathy or radiculopathy, lumbar region: Secondary | ICD-10-CM | POA: Diagnosis not present

## 2024-01-26 DIAGNOSIS — C9001 Multiple myeloma in remission: Secondary | ICD-10-CM | POA: Diagnosis not present

## 2024-01-26 DIAGNOSIS — M51369 Other intervertebral disc degeneration, lumbar region without mention of lumbar back pain or lower extremity pain: Secondary | ICD-10-CM | POA: Diagnosis not present

## 2024-01-26 DIAGNOSIS — M1288 Other specific arthropathies, not elsewhere classified, other specified site: Secondary | ICD-10-CM | POA: Diagnosis not present

## 2024-01-28 ENCOUNTER — Other Ambulatory Visit: Payer: Self-pay | Admitting: Urology

## 2024-01-28 ENCOUNTER — Other Ambulatory Visit: Payer: Self-pay | Admitting: Internal Medicine

## 2024-02-05 DIAGNOSIS — N1831 Chronic kidney disease, stage 3a: Secondary | ICD-10-CM | POA: Diagnosis not present

## 2024-02-05 DIAGNOSIS — J069 Acute upper respiratory infection, unspecified: Secondary | ICD-10-CM | POA: Diagnosis not present

## 2024-02-05 DIAGNOSIS — Z03818 Encounter for observation for suspected exposure to other biological agents ruled out: Secondary | ICD-10-CM | POA: Diagnosis not present

## 2024-02-09 ENCOUNTER — Ambulatory Visit: Payer: Medicare Other | Attending: Cardiology | Admitting: Cardiology

## 2024-02-09 ENCOUNTER — Encounter: Payer: Self-pay | Admitting: Cardiology

## 2024-02-09 VITALS — BP 118/70 | HR 57 | Resp 16 | Ht 75.0 in | Wt 233.0 lb

## 2024-02-09 DIAGNOSIS — I480 Paroxysmal atrial fibrillation: Secondary | ICD-10-CM | POA: Diagnosis not present

## 2024-02-09 DIAGNOSIS — I10 Essential (primary) hypertension: Secondary | ICD-10-CM

## 2024-02-09 DIAGNOSIS — E782 Mixed hyperlipidemia: Secondary | ICD-10-CM | POA: Diagnosis not present

## 2024-02-09 MED ORDER — ATORVASTATIN CALCIUM 40 MG PO TABS: 40.0000 mg | ORAL_TABLET | Freq: Every day | ORAL | 3 refills | Status: AC

## 2024-02-09 MED ORDER — AMLODIPINE BESYLATE 2.5 MG PO TABS: 2.5000 mg | ORAL_TABLET | Freq: Every day | ORAL | 3 refills | Status: AC

## 2024-02-09 MED ORDER — APIXABAN 5 MG PO TABS: 5.0000 mg | ORAL_TABLET | Freq: Two times a day (BID) | ORAL | 2 refills | Status: DC

## 2024-02-09 MED ORDER — NITROGLYCERIN 0.4 MG SL SUBL: 0.4000 mg | SUBLINGUAL_TABLET | SUBLINGUAL | 3 refills | Status: AC | PRN

## 2024-02-09 NOTE — Patient Instructions (Signed)
 Medication Instructions:  Your physician recommends that you continue on your current medications as directed. Please refer to the Current Medication list given to you today.  *If you need a refill on your cardiac medications before your next appointment, please call your pharmacy*   Lab Work: NONE ordered at this time of appointment   Testing/Procedures: NONE ordered at this time of appointment   Follow-Up: At [facility], you and your health needs are our priority.  As part of our continuing mission to provide you with exceptional heart care, we have created designated Provider Care Teams.  These Care Teams include your primary Cardiologist (physician) and Advanced Practice Providers (APPs -  Physician Assistants and Nurse Practitioners) who all work together to provide you with the care you need, when you need it.  We recommend signing up for the patient portal called "MyChart".  Sign up information is provided on this After Visit Summary.  MyChart is used to connect with patients for Virtual Visits (Telemedicine).  Patients are able to view lab/test results, encounter notes, upcoming appointments, etc.  Non-urgent messages can be sent to your provider as well.   To learn more about what you can do with MyChart, go to [email or web address].    Your next appointment:   1 year(s)  Provider:   Dr. Arnell Sieving

## 2024-02-09 NOTE — Progress Notes (Signed)
 Cardiology Office Note:  .   Date:  02/09/2024  ID:  Edward Gallagher, DOB 1941/08/11, MRN [id number] PCP: Dorothyann Peng, MD  [facility] Providers Cardiologist:  Truett Mainland, MD PCP: Dorothyann Peng, MD  Chief Complaint  Patient presents with   Paroxysmal atrial fibrillation   Essential hypertension   Follow-up    1 year      History of Present Illness: .    Edward Gallagher is a 83 y.o. male with hypertension, hyperlipidemia, OSA, paroxysmal Afib, multiple myeloma in remission   Patient is doing well, denies any complaints of chest pain or shortness of breath.  He walks during playing golf regularly without any symptoms.  He has not any bleeding issues with Eliquis.  Blood pressure is well-controlled.  Reviewed recent lab results, details below.  Vitals:   02/09/24 0816  BP: 118/70  Pulse: (!) 57  Resp: 16  SpO2: 97%     ROS:  Review of Systems  Cardiovascular:  Negative for chest pain, dyspnea on exertion, leg swelling, palpitations and syncope.     Studies Reviewed: Marland Kitchen        Independently interpreted EKG 01/10/2024: Probable sinus rhythm 54 bpm Occasional PACs  Independently interpreted 12/2023: Chol 124, TG 54, HDL 54, LDL 58 HbA1C 5.8% Hb 11.1 Cr 1.2, K 4.2 TSH 1.1  Risk Assessment/Calculations:    CHA2DS2-VASc Score = 3  This indicates a 3.2% annual risk of stroke. The patient's score is based upon: CHF History: 0 HTN History: 1 Diabetes History: 0 Stroke History: 0 Vascular Disease History: 0 Age Score: 2 Gender Score: 0     Physical Exam:   Physical Exam Vitals and nursing note reviewed.  Constitutional:      General: He is not in acute distress. Neck:     Vascular: No JVD.  Cardiovascular:     Rate and Rhythm: Normal rate and regular rhythm.     Heart sounds: Normal heart sounds. No murmur heard. Pulmonary:     Effort: Pulmonary effort is normal.     Breath sounds: Normal breath sounds. No wheezing or rales.   Musculoskeletal:     Right lower leg: No edema.     Left lower leg: No edema.      VISIT DIAGNOSES:   ICD-10-CM   1. Paroxysmal atrial fibrillation (HCC)  I48.0 apixaban (ELIQUIS) 5 MG TABS tablet    CANCELED: EKG 12-Lead    2. Mixed hyperlipidemia  E78.2 atorvastatin (LIPITOR) 40 MG tablet    3. Essential hypertension  I10        ASSESSMENT AND PLAN: .    Edward Gallagher is a 83 y.o. male with hypertension, hyperlipidemia, OSA, paroxysmal Afib, multiple myeloma in remission    Paroxysmal Afib: Afib burden <1%. Resting HR during sleep in 30s. Given symptoms are infrequent, would avoid scheduled AV nodal blocking agent or antiarrythmic therapy. No ischemia on stress test. Calcium score 29 (01/2020). Emphasized the use of CPAP.  CHA2DS2VASc score 3, annual stroke risk 3.2%. 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  No recent GI bleeding.  Tolerating apixaban, continue 5 mg bid, (Age >80, Cr <1.5, Wt >60 Kg).   Mixed hyperlipidemia, Elevated calcium score: Continue Lipitor 20 mg daily.   Hypertension:  Controlled H/o AKI with chlorthalidone, syncope with Bidil. Tolerating amlodipine 2.5 mg daily without significant leg edema.   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    Dispense:  90 tablet    Refill:  3   apixaban (ELIQUIS) 5 MG TABS tablet    Sig: Take 1 tablet (5 mg total) by mouth 2 (two) times daily.    Dispense:  200 tablet    Refill:  2    Please send a replace/new response with 100-Day Supply if appropriate to maximize member benefit. Requesting 1 year supply.   atorvastatin (LIPITOR) 40 MG tablet    Sig: Take 1 tablet (40 mg total) by mouth daily.    Dispense:  90 tablet    Refill:  3   nitroGLYCERIN (NITROSTAT) 0.4 MG SL tablet    Sig: Place 1 tablet (0.4 mg total) under the tongue every 5 (five) minutes as  needed for chest pain.    Dispense:  30 tablet    Refill:  3     F/u in 1 year  Signed, Elder Negus, MD

## 2024-02-12 DIAGNOSIS — M25551 Pain in right hip: Secondary | ICD-10-CM | POA: Diagnosis not present

## 2024-02-12 DIAGNOSIS — G893 Neoplasm related pain (acute) (chronic): Secondary | ICD-10-CM | POA: Diagnosis not present

## 2024-02-12 DIAGNOSIS — M533 Sacrococcygeal disorders, not elsewhere classified: Secondary | ICD-10-CM | POA: Diagnosis not present

## 2024-02-12 DIAGNOSIS — M545 Low back pain, unspecified: Secondary | ICD-10-CM | POA: Diagnosis not present

## 2024-02-12 DIAGNOSIS — R001 Bradycardia, unspecified: Secondary | ICD-10-CM | POA: Diagnosis not present

## 2024-02-12 DIAGNOSIS — Z9481 Bone marrow transplant status: Secondary | ICD-10-CM | POA: Diagnosis not present

## 2024-02-12 DIAGNOSIS — Z9484 Stem cells transplant status: Secondary | ICD-10-CM | POA: Diagnosis not present

## 2024-02-12 DIAGNOSIS — Z79899 Other long term (current) drug therapy: Secondary | ICD-10-CM | POA: Diagnosis not present

## 2024-02-12 DIAGNOSIS — E785 Hyperlipidemia, unspecified: Secondary | ICD-10-CM | POA: Diagnosis not present

## 2024-02-12 DIAGNOSIS — Z5112 Encounter for antineoplastic immunotherapy: Secondary | ICD-10-CM | POA: Diagnosis not present

## 2024-02-12 DIAGNOSIS — M898X5 Other specified disorders of bone, thigh: Secondary | ICD-10-CM | POA: Diagnosis not present

## 2024-02-12 DIAGNOSIS — I4891 Unspecified atrial fibrillation: Secondary | ICD-10-CM | POA: Diagnosis not present

## 2024-02-12 DIAGNOSIS — K5900 Constipation, unspecified: Secondary | ICD-10-CM | POA: Diagnosis not present

## 2024-02-12 DIAGNOSIS — Z7952 Long term (current) use of systemic steroids: Secondary | ICD-10-CM | POA: Diagnosis not present

## 2024-02-12 DIAGNOSIS — C9000 Multiple myeloma not having achieved remission: Secondary | ICD-10-CM | POA: Diagnosis not present

## 2024-02-12 DIAGNOSIS — D709 Neutropenia, unspecified: Secondary | ICD-10-CM | POA: Diagnosis not present

## 2024-02-12 DIAGNOSIS — I10 Essential (primary) hypertension: Secondary | ICD-10-CM | POA: Diagnosis not present

## 2024-02-12 DIAGNOSIS — M25552 Pain in left hip: Secondary | ICD-10-CM | POA: Diagnosis not present

## 2024-02-12 DIAGNOSIS — I498 Other specified cardiac arrhythmias: Secondary | ICD-10-CM | POA: Diagnosis not present

## 2024-02-12 DIAGNOSIS — D508 Other iron deficiency anemias: Secondary | ICD-10-CM | POA: Diagnosis not present

## 2024-02-12 DIAGNOSIS — C9001 Multiple myeloma in remission: Secondary | ICD-10-CM | POA: Diagnosis not present

## 2024-02-14 ENCOUNTER — Other Ambulatory Visit: Payer: Self-pay | Admitting: Internal Medicine

## 2024-02-19 DIAGNOSIS — E611 Iron deficiency: Secondary | ICD-10-CM | POA: Diagnosis not present

## 2024-02-19 DIAGNOSIS — Z9481 Bone marrow transplant status: Secondary | ICD-10-CM | POA: Diagnosis not present

## 2024-02-19 DIAGNOSIS — D508 Other iron deficiency anemias: Secondary | ICD-10-CM | POA: Diagnosis not present

## 2024-02-19 DIAGNOSIS — C9001 Multiple myeloma in remission: Secondary | ICD-10-CM | POA: Diagnosis not present

## 2024-02-28 DIAGNOSIS — C9001 Multiple myeloma in remission: Secondary | ICD-10-CM | POA: Diagnosis not present

## 2024-02-28 DIAGNOSIS — M898X5 Other specified disorders of bone, thigh: Secondary | ICD-10-CM | POA: Diagnosis not present

## 2024-03-06 ENCOUNTER — Other Ambulatory Visit: Payer: Self-pay | Admitting: Family Medicine

## 2024-03-17 ENCOUNTER — Other Ambulatory Visit: Payer: Self-pay | Admitting: Cardiology

## 2024-03-17 DIAGNOSIS — I480 Paroxysmal atrial fibrillation: Secondary | ICD-10-CM

## 2024-03-18 DIAGNOSIS — I10 Essential (primary) hypertension: Secondary | ICD-10-CM | POA: Diagnosis not present

## 2024-03-18 DIAGNOSIS — Z79899 Other long term (current) drug therapy: Secondary | ICD-10-CM | POA: Diagnosis not present

## 2024-03-18 DIAGNOSIS — D509 Iron deficiency anemia, unspecified: Secondary | ICD-10-CM | POA: Diagnosis not present

## 2024-03-18 DIAGNOSIS — K5900 Constipation, unspecified: Secondary | ICD-10-CM | POA: Diagnosis not present

## 2024-03-18 DIAGNOSIS — G893 Neoplasm related pain (acute) (chronic): Secondary | ICD-10-CM | POA: Diagnosis not present

## 2024-03-18 DIAGNOSIS — E785 Hyperlipidemia, unspecified: Secondary | ICD-10-CM | POA: Diagnosis not present

## 2024-03-18 DIAGNOSIS — M25552 Pain in left hip: Secondary | ICD-10-CM | POA: Diagnosis not present

## 2024-03-18 DIAGNOSIS — I4891 Unspecified atrial fibrillation: Secondary | ICD-10-CM | POA: Diagnosis not present

## 2024-03-18 DIAGNOSIS — M25551 Pain in right hip: Secondary | ICD-10-CM | POA: Diagnosis not present

## 2024-03-18 DIAGNOSIS — Z9481 Bone marrow transplant status: Secondary | ICD-10-CM | POA: Diagnosis not present

## 2024-03-18 DIAGNOSIS — Z5112 Encounter for antineoplastic immunotherapy: Secondary | ICD-10-CM | POA: Diagnosis not present

## 2024-03-18 DIAGNOSIS — C9001 Multiple myeloma in remission: Secondary | ICD-10-CM | POA: Diagnosis not present

## 2024-03-18 NOTE — Telephone Encounter (Signed)
 Prescription refill request for Eliquis received. Indication:afib Last office visit:2/25 Scr:1.4  3/25 Age: 83 Weight:105.7  kg  Prescription refilled

## 2024-04-03 DIAGNOSIS — R3914 Feeling of incomplete bladder emptying: Secondary | ICD-10-CM | POA: Diagnosis not present

## 2024-04-22 DIAGNOSIS — G893 Neoplasm related pain (acute) (chronic): Secondary | ICD-10-CM | POA: Diagnosis not present

## 2024-04-22 DIAGNOSIS — E785 Hyperlipidemia, unspecified: Secondary | ICD-10-CM | POA: Diagnosis not present

## 2024-04-22 DIAGNOSIS — Z9481 Bone marrow transplant status: Secondary | ICD-10-CM | POA: Diagnosis not present

## 2024-04-22 DIAGNOSIS — D702 Other drug-induced agranulocytosis: Secondary | ICD-10-CM | POA: Diagnosis not present

## 2024-04-22 DIAGNOSIS — M25552 Pain in left hip: Secondary | ICD-10-CM | POA: Diagnosis not present

## 2024-04-22 DIAGNOSIS — K5900 Constipation, unspecified: Secondary | ICD-10-CM | POA: Diagnosis not present

## 2024-04-22 DIAGNOSIS — I4891 Unspecified atrial fibrillation: Secondary | ICD-10-CM | POA: Diagnosis not present

## 2024-04-22 DIAGNOSIS — M25551 Pain in right hip: Secondary | ICD-10-CM | POA: Diagnosis not present

## 2024-04-22 DIAGNOSIS — D509 Iron deficiency anemia, unspecified: Secondary | ICD-10-CM | POA: Diagnosis not present

## 2024-04-22 DIAGNOSIS — M545 Low back pain, unspecified: Secondary | ICD-10-CM | POA: Diagnosis not present

## 2024-04-22 DIAGNOSIS — Z79899 Other long term (current) drug therapy: Secondary | ICD-10-CM | POA: Diagnosis not present

## 2024-04-22 DIAGNOSIS — I10 Essential (primary) hypertension: Secondary | ICD-10-CM | POA: Diagnosis not present

## 2024-04-22 DIAGNOSIS — C9001 Multiple myeloma in remission: Secondary | ICD-10-CM | POA: Diagnosis not present

## 2024-04-29 DIAGNOSIS — Z9481 Bone marrow transplant status: Secondary | ICD-10-CM | POA: Diagnosis not present

## 2024-04-29 DIAGNOSIS — C9001 Multiple myeloma in remission: Secondary | ICD-10-CM | POA: Diagnosis not present

## 2024-04-29 DIAGNOSIS — Z5112 Encounter for antineoplastic immunotherapy: Secondary | ICD-10-CM | POA: Diagnosis not present

## 2024-05-01 ENCOUNTER — Ambulatory Visit (INDEPENDENT_AMBULATORY_CARE_PROVIDER_SITE_OTHER): Payer: Medicare Other

## 2024-05-01 VITALS — BP 128/62 | HR 59 | Temp 98.5°F | Ht 75.0 in | Wt 240.0 lb

## 2024-05-01 DIAGNOSIS — Z0000 Encounter for general adult medical examination without abnormal findings: Secondary | ICD-10-CM

## 2024-05-01 NOTE — Progress Notes (Signed)
 Subjective:   Edward Gallagher is a 83 y.o. who presents for a Medicare Wellness preventive visit.  As a reminder, Annual Wellness Visits don't include a physical exam, and some assessments may be limited, especially if this visit is performed virtually. We may recommend an in-person follow-up visit with your provider if needed.  Visit Complete: In person    Persons Participating in Visit: Patient.  AWV Questionnaire: Yes: Patient Medicare AWV questionnaire was completed by the patient on 04/27/2024; I have confirmed that all information answered by patient is correct and no changes since this date.  Cardiac Risk Factors include: advanced age (>66men, >58 women);hypertension;male gender     Objective:     Today's Vitals   05/01/24 1400  BP: 128/62  Pulse: (!) 59  Temp: 98.5 F (36.9 C)  TempSrc: Oral  SpO2: 98%  Weight: 240 lb (108.9 kg)  Height: 6\' 3"  (1.905 m)   Body mass index is 30 kg/m.     05/01/2024    2:05 PM 04/13/2023    3:03 PM 10/26/2022   12:24 PM 10/12/2022    7:22 AM 04/06/2022    3:07 PM 05/29/2021   10:33 AM 03/31/2021   10:10 AM  Advanced Directives  Does Patient Have a Medical Advance Directive? Yes Yes Yes Yes Yes No No  Type of Estate agent of [place];Living will Healthcare Power of [place];Living will Living will;Healthcare Power of [employer] Power of [place];Living will Healthcare Power of [place];Living will    Does patient want to make changes to medical advance directive?    No - Guardian declined     Copy of Healthcare Power of Attorney in Chart? No - copy requested No - copy requested No - copy requested No - copy requested No - copy requested    Would patient like information on creating a medical advance directive?      No - Patient declined No - Patient declined    Current Medications (verified) Outpatient Encounter Medications as of 05/01/2024  Medication Sig   acetaminophen  (TYLENOL ) 500 MG tablet Take  500 mg by mouth as needed.   acyclovir  (ZOVIRAX ) 400 MG tablet Take 400 mg by mouth 2 (two) times daily.   amLODipine  (NORVASC ) 2.5 MG tablet Take 1 tablet (2.5 mg total) by mouth daily.   atorvastatin  (LIPITOR) 40 MG tablet Take 1 tablet (40 mg total) by mouth daily.   cetirizine (ZYRTEC) 10 MG tablet Take 10 mg by mouth daily.   ELIQUIS  5 MG TABS tablet TAKE 1 TABLET BY MOUTH TWICE  DAILY   finasteride  (PROSCAR ) 5 MG tablet Take 5 mg by mouth daily.   fluticasone  (FLONASE ) 50 MCG/ACT nasal spray SHAKE LIQUID AND USE 1 SPRAY IN EACH NOSTRIL DAILY   furosemide  (LASIX ) 20 MG tablet Take 1 tablet (20 mg total) by mouth daily.   lenalidomide  (REVLIMID ) 5 MG capsule Take 5 mg by mouth daily.   linaclotide  (LINZESS ) 145 MCG CAPS capsule Take 1 capsule (145 mcg total) by mouth daily before breakfast.   Misc Natural Products (NEURIVA PO) Take by mouth at bedtime.   nitroGLYCERIN  (NITROSTAT ) 0.4 MG SL tablet Place 1 tablet (0.4 mg total) under the tongue every 5 (five) minutes as needed for chest pain.   NON FORMULARY NEURIVA BRAIN HEALTH   senna-docusate (SENOKOT-S) 8.6-50 MG tablet Take 1 tablet by mouth 2 (two) times daily.   terazosin  (HYTRIN ) 5 MG capsule TAKE 1 CAPSULE BY MOUTH AT  BEDTIME   traMADol  (ULTRAM )  50 MG tablet Take 1 tablet by mouth every 6 (six) hours as needed.   triamcinolone  (NASACORT ) 55 MCG/ACT AERO nasal inhaler Place 2 sprays into the nose daily.   Xylitol (XYLIMELTS MT) Use as directed in the mouth or throat at bedtime.   No facility-administered encounter medications on file as of 05/01/2024.    Allergies (verified) Patient has no known allergies.   History: Past Medical History:  Diagnosis Date   A-fib [facility])    Allergy    Arthritis    Benign hypertension with CKD (chronic kidney disease) stage III (HCC) 01/15/2024   Encounter for general adult medical examination w/o abnormal findings 01/15/2024   Hyperlipidemia    Hypertension    Multiple myeloma (HCC)     Prostatic hypertrophy    Sinus trouble    Sleep apnea    CPAP   Wears dentures    full upper, partial lower   Past Surgical History:  Procedure Laterality Date   CATARACT EXTRACTION W/PHACO Right 10/12/2022   Procedure: CATARACT EXTRACTION PHACO AND INTRAOCULAR LENS PLACEMENT (IOC) RIGHT CLARION PANOPTIC LENS 8.36 01:03.5;  Surgeon: Annell Kidney, MD;  Location: [facility];  Service: Ophthalmology;  Laterality: Right;  sleep apnea   CATARACT EXTRACTION W/PHACO Left 10/26/2022   Procedure: CATARACT EXTRACTION PHACO AND INTRAOCULAR LENS PLACEMENT (IOC) LEFT CLAREON PANOPTIC LENS  4.20  00:48.1;  Surgeon: Annell Kidney, MD;  Location: [facility];  Service: Ophthalmology;  Laterality: Left;  sleep apnea   HERNIA REPAIR     VASECTOMY     Family History  Problem Relation Age of Onset   Healthy Mother    Hypertension Mother    Prostate cancer Father    Cancer Father    Testicular cancer Brother    Hypertension Brother    Prostate cancer Brother    Lung cancer Brother    Cancer Brother    Cancer Brother        double mynoma   Hypertension Other    Cancer Son    Social History   Socioeconomic History   Marital status: Married    Spouse name: Not on file   Number of children: 0   Years of education: Not on file   Highest education level: Associate degree: occupational, Scientist, product/process development, or vocational program  Occupational History   Occupation: retired  Tobacco Use   Smoking status: Former    Current packs/day: 0.00    Average packs/day: 0.5 packs/day for 35.0 years (17.5 ttl pk-yrs)    Types: Cigarettes    Start date: 12/12/1962    Quit date: 12/12/1997    Years since quitting: 26.4   Smokeless tobacco: Never   Tobacco comments:    I quit in Sept , 1999  Vaping Use   Vaping status: Never Used  Substance and Sexual Activity   Alcohol use: Not Currently    Comment: I haven't had a drink in over fiur years.   Drug use: No   Sexual activity: Not  Currently    Birth control/protection: None  Other Topics Concern   Not on file  Social History Narrative   Not on file   Social Drivers of Health   Financial Resource Strain: Low Risk  (05/01/2024)   Overall Financial Resource Strain (CARDIA)    Difficulty of Paying Living Expenses: Not hard at all  Food Insecurity: No Food Insecurity (05/01/2024)   Hunger Vital Sign    Worried About Running Out of Food in the Last Year: Never true  Ran Out of Food in the Last Year: Never true  Transportation Needs: No Transportation Needs (05/01/2024)   PRAPARE - Administrator, Civil Service (Medical): No    Lack of Transportation (Non-Medical): No  Physical Activity: Insufficiently Active (05/01/2024)   Exercise Vital Sign    Days of Exercise per Week: 3 days    Minutes of Exercise per Session: 40 min  Stress: No Stress Concern Present (05/01/2024)   [employer] of Occupational Health - Occupational Stress Questionnaire    Feeling of Stress : Not at all  Social Connections: Socially Integrated (05/01/2024)   Social Connection and Isolation Panel [NHANES]    Frequency of Communication with Friends and Family: Three times a week    Frequency of Social Gatherings with Friends and Family: Three times a week    Attends Religious Services: More than 4 times per year    Active Member of Clubs or Organizations: Yes    Attends Banker Meetings: More than 4 times per year    Marital Status: Married    Tobacco Counseling Counseling given: Not Answered Tobacco comments: I quit in Sept , 1999    Clinical Intake:  Pre-visit preparation completed: Yes  Pain : No/denies pain     Nutritional Status: BMI > 30  Obese Nutritional Risks: None Diabetes: No  Lab Results  Component Value Date   HGBA1C 5.8 (H) 01/10/2024   HGBA1C 5.5 11/24/2021   HGBA1C 6.0 (H) 10/06/2020     How often do you need to have someone help you when you read instructions, pamphlets, or  other written materials from your doctor or pharmacy?: 1 - Never  Interpreter Needed?: No  Information entered by :: NAllen LPN   Activities of Daily Living     04/27/2024    8:40 AM  In your present state of health, do you have any difficulty performing the following activities:  Hearing? 0  Vision? 0  Difficulty concentrating or making decisions? 0  Walking or climbing stairs? 0  Dressing or bathing? 0  Doing errands, shopping? 0  Preparing Food and eating ? N  Using the Toilet? N  In the past six months, have you accidently leaked urine? N  Do you have problems with loss of bowel control? N  Managing your Medications? N  Managing your Finances? N  Housekeeping or managing your Housekeeping? N    Patient Care Team: Cleave Curling, MD as PCP - General (Internal Medicine) [place], [facility]) Homero Luster, MD as Attending Physician (Urology) Cody Das, MD as Consulting Physician (Cardiology)  Indicate any recent Medical Services you may have received from other than [facility] providers in the past year (date may be approximate).     Assessment:    This is a routine wellness examination for Edward Gallagher.  Hearing/Vision screen Hearing Screening - Comments:: Denies hearing issues Vision Screening - Comments:: Regular eye exams, [facility]   Goals Addressed             This Visit's Progress    Patient Stated       05/01/2024, wants to weigh 220 pounds       Depression Screen     05/01/2024    2:11 PM 01/10/2024    8:52 AM 09/18/2023    8:34 AM 04/13/2023    3:04 PM 03/29/2023   10:13 AM 03/29/2023   10:07 AM 04/06/2022    3:08 PM  PHQ 2/9 Scores  PHQ - 2  Score 0 0 0 0 0 0 0  PHQ- 9 Score 0 0 0  0 0     Fall Risk     04/27/2024    8:40 AM 01/10/2024    8:52 AM 09/18/2023    8:33 AM 04/08/2023    2:36 PM 03/29/2023   10:07 AM  Fall Risk   Falls in the past year? 0 0 0 0 0  Number falls in past yr: 0 0 0 0 0  Injury with Fall? 0 0 0 0 0   Risk for fall due to : Medication side effect No Fall Risks No Fall Risks Medication side effect No Fall Risks  Follow up Falls evaluation completed;Falls prevention discussed Falls evaluation completed Falls evaluation completed Falls prevention discussed;Education provided;Falls evaluation completed Falls evaluation completed    MEDICARE RISK AT HOME:  Medicare Risk at Home Any stairs in or around the home?: (Patient-Rptd) No If so, are there any without handrails?: (Patient-Rptd) No Home free of loose throw rugs in walkways, pet beds, electrical cords, etc?: (Patient-Rptd) No Adequate lighting in your home to reduce risk of falls?: (Patient-Rptd) Yes Life alert?: (Patient-Rptd) No Use of a cane, walker or w/c?: (Patient-Rptd) No Grab bars in the bathroom?: (Patient-Rptd) Yes Shower chair or bench in shower?: (Patient-Rptd) Yes Elevated toilet seat or a handicapped toilet?: (Patient-Rptd) Yes  TIMED UP AND GO:  Was the test performed?  Yes  Length of time to ambulate 10 feet: 5 sec Gait steady and fast without use of assistive device  Cognitive Function: 6CIT completed        05/01/2024    2:11 PM 04/13/2023    3:05 PM 04/06/2022    3:11 PM 03/31/2021   10:11 AM 03/25/2020   10:36 AM  6CIT Screen  What Year? 0 points 0 points 0 points 0 points 0 points  What month? 0 points 0 points 0 points 0 points 0 points  What time? 0 points 0 points 0 points 0 points 0 points  Count back from 20 0 points 2 points 0 points 0 points 4 points  Months in reverse 0 points 0 points 0 points 0 points 0 points  Repeat phrase 0 points 0 points 0 points 2 points 0 points  Total Score 0 points 2 points 0 points 2 points 4 points    Immunizations Immunization History  Administered Date(s) Administered   DTaP / HiB / IPV 09/26/2022, 12/06/2022, 03/20/2023   Fluad Quad(high Dose 65+) 08/15/2020, 11/03/2021, 08/23/2022, 08/28/2023   Hepb-cpg 12/06/2022, 03/20/2023   Influenza, High Dose Seasonal  PF 08/14/2019, 08/28/2023   Influenza-Unspecified 08/13/2015, 08/20/2018, 08/16/2019   PFIZER Comirnaty(Gray Top)Covid-19 Tri-Sucrose Vaccine 04/19/2021, 11/22/2021, 11/02/2022   PFIZER(Purple Top)SARS-COV-2 Vaccination 01/03/2020, 01/24/2020, 09/14/2020   PNEUMOCOCCAL CONJUGATE-20 06/07/2022, 09/26/2022, 12/06/2022, 03/20/2023   Pfizer Covid-19 Vaccine Bivalent Booster 69yrs & up 08/28/2023   Pneumococcal Conjugate-13 08/13/2015   Pneumococcal Polysaccharide-23 11/03/2021   Zoster Recombinant(Shingrix) 08/14/2019, 10/13/2019   Zoster, Live 12/12/2013    Screening Tests Health Maintenance  Topic Date Due   COVID-19 Vaccine (8 - 2024-25 season) 10/23/2023   INFLUENZA VACCINE  07/12/2024   Medicare Annual Wellness (AWV)  05/01/2025   DTaP/Tdap/Td (4 - Tdap) 03/19/2033   Pneumonia Vaccine 29+ Years old  Completed   Zoster Vaccines- Shingrix  Completed   HPV VACCINES  Aged Out   Meningococcal B Vaccine  Aged Out   Hepatitis C Screening  Discontinued    Health Maintenance  Health Maintenance Due  Topic Date  Due   COVID-19 Vaccine (8 - 2024-25 season) 10/23/2023   Health Maintenance Items Addressed: Up to date.  Additional Screening:  Vision Screening: Recommended annual ophthalmology exams for early detection of glaucoma and other disorders of the eye.  Dental Screening: Recommended annual dental exams for proper oral hygiene  Community Resource Referral / Chronic Care Management: CRR required this visit?  No   CCM required this visit?  No   Plan:    I have personally reviewed and noted the following in the patient's chart:   Medical and social history Use of alcohol, tobacco or illicit drugs  Current medications and supplements including opioid prescriptions. Patient is not currently taking opioid prescriptions. Functional ability and status Nutritional status Physical activity Advanced directives List of other physicians Hospitalizations, surgeries, and ER  visits in previous 12 months Vitals Screenings to include cognitive, depression, and falls Referrals and appointments  In addition, I have reviewed and discussed with patient certain preventive protocols, quality metrics, and best practice recommendations. A written personalized care plan for preventive services as well as general preventive health recommendations were provided to patient.   Areatha Beecham, LPN   1/61/0960   After Visit Summary: (In Person-Printed) AVS printed and given to the patient  Notes: Nothing significant to report at this time.

## 2024-05-01 NOTE — Patient Instructions (Addendum)
 Edward Gallagher , Thank you for taking time out of your busy schedule to complete your Annual Wellness Visit with me. I enjoyed our conversation and look forward to speaking with you again next year. I, as well as your care team,  appreciate your ongoing commitment to your health goals. Please review the following plan we discussed and let me know if I can assist you in the future. Your Game plan/ To Do List    Referrals: If you haven't heard from the office you've been referred to, please reach out to them at the phone provided.  N/a Follow up Visits: Next Medicare AWV with our clinical staff: 06/11/2025 at 2:00   Have you seen your provider in the last 6 months (3 months if uncontrolled diabetes)? Yes Next Office Visit with your provider: 07/10/2024 at 9:40  Clinician Recommendations:  Aim for 30 minutes of exercise or brisk walking, 6-8 glasses of water, and 5 servings of fruits and vegetables each day.       This is a list of the screening recommended for you and due dates:  Health Maintenance  Topic Date Due   Flu Shot  07/12/2024   COVID-19 Vaccine (9 - Pfizer risk 2024-25 season) 10/16/2024   Medicare Annual Wellness Visit  05/01/2025   DTaP/Tdap/Td vaccine (4 - Tdap) 03/19/2033   Pneumonia Vaccine  Completed   Zoster (Shingles) Vaccine  Completed   HPV Vaccine  Aged Out   Meningitis B Vaccine  Aged Out   Hepatitis C Screening  Discontinued    Advanced directives: (Copy Requested) Please bring a copy of your health care power of attorney and living will to the office to be added to your chart at your convenience. You can mail to [facility] [address] [place], [place] [address] or email to [email or web address] Advance Care Planning is important because it:  [x]  Makes sure you receive the medical care that is consistent with your values, goals, and preferences  [x]  It provides guidance to your family and loved ones and reduces their decisional burden about  whether or not they are making the right decisions based on your wishes.  Follow the link provided in your after visit summary or read over the paperwork we have mailed to you to help you started getting your Advance Directives in place. If you need assistance in completing these, please reach out to us  so that we can help you!  See attachments for Preventive Care and Fall Prevention Tips.

## 2024-05-24 ENCOUNTER — Other Ambulatory Visit: Payer: Self-pay | Admitting: Internal Medicine

## 2024-06-03 DIAGNOSIS — E611 Iron deficiency: Secondary | ICD-10-CM | POA: Diagnosis not present

## 2024-06-03 DIAGNOSIS — Z9481 Bone marrow transplant status: Secondary | ICD-10-CM | POA: Diagnosis not present

## 2024-06-03 DIAGNOSIS — D702 Other drug-induced agranulocytosis: Secondary | ICD-10-CM | POA: Diagnosis not present

## 2024-06-03 DIAGNOSIS — C9001 Multiple myeloma in remission: Secondary | ICD-10-CM | POA: Diagnosis not present

## 2024-06-03 DIAGNOSIS — G893 Neoplasm related pain (acute) (chronic): Secondary | ICD-10-CM | POA: Diagnosis not present

## 2024-06-10 ENCOUNTER — Telehealth: Payer: Self-pay | Admitting: Internal Medicine

## 2024-06-10 DIAGNOSIS — Z79899 Other long term (current) drug therapy: Secondary | ICD-10-CM | POA: Diagnosis not present

## 2024-06-10 DIAGNOSIS — M545 Low back pain, unspecified: Secondary | ICD-10-CM | POA: Diagnosis not present

## 2024-06-10 DIAGNOSIS — M25552 Pain in left hip: Secondary | ICD-10-CM | POA: Diagnosis not present

## 2024-06-10 DIAGNOSIS — Z9481 Bone marrow transplant status: Secondary | ICD-10-CM | POA: Diagnosis not present

## 2024-06-10 DIAGNOSIS — Z9484 Stem cells transplant status: Secondary | ICD-10-CM | POA: Diagnosis not present

## 2024-06-10 DIAGNOSIS — I480 Paroxysmal atrial fibrillation: Secondary | ICD-10-CM | POA: Diagnosis not present

## 2024-06-10 DIAGNOSIS — Z7961 Long term (current) use of immunomodulator: Secondary | ICD-10-CM | POA: Diagnosis not present

## 2024-06-10 DIAGNOSIS — I10 Essential (primary) hypertension: Secondary | ICD-10-CM | POA: Diagnosis not present

## 2024-06-10 DIAGNOSIS — E785 Hyperlipidemia, unspecified: Secondary | ICD-10-CM | POA: Diagnosis not present

## 2024-06-10 DIAGNOSIS — G893 Neoplasm related pain (acute) (chronic): Secondary | ICD-10-CM | POA: Diagnosis not present

## 2024-06-10 DIAGNOSIS — Z5112 Encounter for antineoplastic immunotherapy: Secondary | ICD-10-CM | POA: Diagnosis not present

## 2024-06-10 DIAGNOSIS — E611 Iron deficiency: Secondary | ICD-10-CM | POA: Diagnosis not present

## 2024-06-10 DIAGNOSIS — M160 Bilateral primary osteoarthritis of hip: Secondary | ICD-10-CM | POA: Diagnosis not present

## 2024-06-10 DIAGNOSIS — C9001 Multiple myeloma in remission: Secondary | ICD-10-CM | POA: Diagnosis not present

## 2024-06-10 DIAGNOSIS — K5900 Constipation, unspecified: Secondary | ICD-10-CM | POA: Diagnosis not present

## 2024-06-10 DIAGNOSIS — D508 Other iron deficiency anemias: Secondary | ICD-10-CM | POA: Diagnosis not present

## 2024-06-10 DIAGNOSIS — M778 Other enthesopathies, not elsewhere classified: Secondary | ICD-10-CM | POA: Diagnosis not present

## 2024-06-10 DIAGNOSIS — D702 Other drug-induced agranulocytosis: Secondary | ICD-10-CM | POA: Diagnosis not present

## 2024-06-10 DIAGNOSIS — M25551 Pain in right hip: Secondary | ICD-10-CM | POA: Diagnosis not present

## 2024-06-10 NOTE — Telephone Encounter (Signed)
 Copied from CRM [id number]. Topic: Clinical - Medication Question >> Jun 10, 2024  3:16 PM Donee H wrote: Reason for CRM: Patient called stating he went to his cancer specialist and he was told that he should be taking terazosin  (HYTRIN ) 5 MG capsule twice a night at bedtime. Patient states is this true because he was prescribed terazosin  (HYTRIN ) 5 MG capsule to take once at night. Patient also wants to know if this medication comes in 10mg . Callback number [phone]

## 2024-06-10 NOTE — Telephone Encounter (Signed)
 Please see note.

## 2024-06-13 ENCOUNTER — Other Ambulatory Visit: Payer: Self-pay | Admitting: Internal Medicine

## 2024-07-01 DIAGNOSIS — Z961 Presence of intraocular lens: Secondary | ICD-10-CM | POA: Diagnosis not present

## 2024-07-01 DIAGNOSIS — H26493 Other secondary cataract, bilateral: Secondary | ICD-10-CM | POA: Diagnosis not present

## 2024-07-09 ENCOUNTER — Other Ambulatory Visit: Payer: Self-pay | Admitting: Internal Medicine

## 2024-07-10 ENCOUNTER — Ambulatory Visit: Payer: Medicare Other | Admitting: Internal Medicine

## 2024-07-15 DIAGNOSIS — C9001 Multiple myeloma in remission: Secondary | ICD-10-CM | POA: Diagnosis not present

## 2024-07-15 DIAGNOSIS — Z9481 Bone marrow transplant status: Secondary | ICD-10-CM | POA: Diagnosis not present

## 2024-07-15 DIAGNOSIS — D702 Other drug-induced agranulocytosis: Secondary | ICD-10-CM | POA: Diagnosis not present

## 2024-07-15 DIAGNOSIS — Z5111 Encounter for antineoplastic chemotherapy: Secondary | ICD-10-CM | POA: Diagnosis not present

## 2024-07-17 ENCOUNTER — Encounter: Payer: Self-pay | Admitting: Internal Medicine

## 2024-07-17 ENCOUNTER — Ambulatory Visit (INDEPENDENT_AMBULATORY_CARE_PROVIDER_SITE_OTHER): Admitting: Internal Medicine

## 2024-07-17 VITALS — BP 134/82 | HR 69 | Temp 98.4°F | Ht 75.0 in | Wt 242.0 lb

## 2024-07-17 DIAGNOSIS — N1831 Chronic kidney disease, stage 3a: Secondary | ICD-10-CM | POA: Diagnosis not present

## 2024-07-17 DIAGNOSIS — I480 Paroxysmal atrial fibrillation: Secondary | ICD-10-CM

## 2024-07-17 DIAGNOSIS — E7800 Pure hypercholesterolemia, unspecified: Secondary | ICD-10-CM

## 2024-07-17 DIAGNOSIS — C9000 Multiple myeloma not having achieved remission: Secondary | ICD-10-CM

## 2024-07-17 DIAGNOSIS — R7303 Prediabetes: Secondary | ICD-10-CM | POA: Diagnosis not present

## 2024-07-17 DIAGNOSIS — D6869 Other thrombophilia: Secondary | ICD-10-CM | POA: Diagnosis not present

## 2024-07-17 DIAGNOSIS — I1310 Hypertensive heart and chronic kidney disease without heart failure, with stage 1 through stage 4 chronic kidney disease, or unspecified chronic kidney disease: Secondary | ICD-10-CM

## 2024-07-17 DIAGNOSIS — I129 Hypertensive chronic kidney disease with stage 1 through stage 4 chronic kidney disease, or unspecified chronic kidney disease: Secondary | ICD-10-CM

## 2024-07-17 DIAGNOSIS — E6609 Other obesity due to excess calories: Secondary | ICD-10-CM | POA: Insufficient documentation

## 2024-07-17 NOTE — Progress Notes (Signed)
 I,[name], CMA,acting as a Neurosurgeon for [name], MD.,have documented all relevant documentation on the behalf of [name], MD,as directed by  [name], MD while in the presence of [name], MD.  Subjective:  Patient ID: Edward Gallagher , male    DOB: [birth year] , 83 y.o.   MRN: [id number]  Chief Complaint  Patient presents with   Hypertension    Patient presents today for bp, chol & prediabetes follow up. He reports compliance with medications. Denies headache, chest pain & sob. While here today he would like to clarify how to take Terazosin  he states taking once daily but was told by [facility] to take twice daily.    Hyperlipidemia   Prediabetes    HPI Discussed the use of AI scribe software for clinical note transcription with the patient, who gave verbal consent to proceed.  History of Present Illness Edward Gallagher is an 83 year old male with hypertension who presents for medication management and blood pressure monitoring.  He is not currently taking his blood pressure at home. He reports that at his recent [facility] visits, his blood pressure readings were above 140 mmHg. He is taking terazosin  once a day, and there is a discussion about potentially increasing the dosage to twice a day. He has not experienced any episodes of hypotension or dizziness. He is also taking amlodipine , but a history of swelling with higher doses led to a previous dose reduction.  He does not use salt in his diet and rarely consumes salty foods such as chips, crackers, or processed meats. He occasionally eats cheese and pizza, but these are infrequent. He consumes multigrain bread and had sausage the day before his visit, but this is not a regular occurrence.  He is not taking any supplements or multivitamins but takes Nereva for brain health.  Regarding his hip, he reports no issues as long as he maintains adequate pain medication. He has not been golfing recently due to the  heat.  No issues with swelling in his ankles, dizziness, or anxiety when visiting [facility].   Hypertension This is a chronic problem. The current episode started more than 1 year ago. The problem has been gradually improving since onset. The problem is controlled. Pertinent negatives include no blurred vision, chest pain, palpitations or shortness of breath. Risk factors for coronary artery disease include male gender and obesity. Identifiable causes of hypertension include chronic renal disease.  Hyperlipidemia This is a chronic problem. The current episode started more than 1 year ago. Exacerbating diseases include chronic renal disease. He has no history of obesity. Pertinent negatives include no chest pain or shortness of breath.     Past Medical History:  Diagnosis Date   A-fib [facility])    Allergy    Arthritis    Benign hypertension with CKD (chronic kidney disease) stage III (HCC) 01/15/2024   Encounter for general adult medical examination w/o abnormal findings 01/15/2024   Hyperlipidemia    Hypertension    Multiple myeloma (HCC)    Prostatic hypertrophy    Sinus trouble    Sleep apnea    CPAP   Wears dentures    full upper, partial lower     Family History  Problem Relation Age of Onset   Healthy Mother    Hypertension Mother    Prostate cancer Father    Cancer Father    Testicular cancer Brother    Hypertension Brother    Prostate cancer Brother  Lung cancer Brother    Cancer Brother    Cancer Brother        double mynoma   Hypertension Other    Cancer Son      Current Outpatient Medications:    acetaminophen  (TYLENOL ) 500 MG tablet, Take 500 mg by mouth as needed., Disp: , Rfl:    amLODipine  (NORVASC ) 2.5 MG tablet, Take 1 tablet (2.5 mg total) by mouth daily., Disp: 90 tablet, Rfl: 3   atorvastatin  (LIPITOR) 40 MG tablet, Take 1 tablet (40 mg total) by mouth daily., Disp: 90 tablet, Rfl: 3   cetirizine (ZYRTEC) 10 MG tablet, Take 10 mg by mouth daily., Disp: ,  Rfl:    ELIQUIS  5 MG TABS tablet, TAKE 1 TABLET BY MOUTH TWICE  DAILY, Disp: 200 tablet, Rfl: 2   finasteride  (PROSCAR ) 5 MG tablet, Take 5 mg by mouth daily., Disp: , Rfl:    fluticasone  (FLONASE ) 50 MCG/ACT nasal spray, SHAKE LIQUID AND USE 1 SPRAY IN EACH NOSTRIL DAILY, Disp: 16 g, Rfl: 2   furosemide  (LASIX ) 20 MG tablet, TAKE 1 TABLET(20 MG) BY MOUTH DAILY, Disp: 90 tablet, Rfl: 2   lenalidomide  (REVLIMID ) 5 MG capsule, Take 5 mg by mouth daily., Disp: , Rfl:    LINZESS  145 MCG CAPS capsule, TAKE 1 CAPSULE(145 MCG) BY MOUTH DAILY BEFORE BREAKFAST, Disp: 90 capsule, Rfl: 2   Misc Natural Products (NEURIVA PO), Take by mouth at bedtime., Disp: , Rfl:    nitroGLYCERIN  (NITROSTAT ) 0.4 MG SL tablet, Place 1 tablet (0.4 mg total) under the tongue every 5 (five) minutes as needed for chest pain., Disp: 30 tablet, Rfl: 3   NON FORMULARY, NEURIVA BRAIN HEALTH, Disp: , Rfl:    senna-docusate (SENOKOT-S) 8.6-50 MG tablet, Take 1 tablet by mouth 2 (two) times daily., Disp: , Rfl:    terazosin  (HYTRIN ) 5 MG capsule, TAKE 1 CAPSULE BY MOUTH AT  BEDTIME, Disp: 100 capsule, Rfl: 2   traMADol  (ULTRAM ) 50 MG tablet, Take 1 tablet by mouth every 6 (six) hours as needed., Disp: , Rfl:    triamcinolone  (NASACORT ) 55 MCG/ACT AERO nasal inhaler, Place 2 sprays into the nose daily., Disp: , Rfl:    Xylitol (XYLIMELTS MT), Use as directed in the mouth or throat at bedtime., Disp: , Rfl:    acyclovir  (ZOVIRAX ) 400 MG tablet, TAKE 1 TABLET(400 MG) BY MOUTH EVERY 12 HOURS, Disp: 180 tablet, Rfl: 0   No Known Allergies   Review of Systems  Constitutional: Negative.   Eyes:  Negative for blurred vision.  Respiratory: Negative.  Negative for shortness of breath.   Cardiovascular: Negative.  Negative for chest pain and palpitations.  Gastrointestinal: Negative.   Endocrine: Negative.   Skin: Negative.   Allergic/Immunologic: Negative.   Hematological: Negative.      Today's Vitals   07/17/24 0857  BP: 134/82   Pulse: 69  Temp: 98.4 F (36.9 C)  SpO2: 98%  Weight: 242 lb (109.8 kg)  Height: 6' 3 (1.905 m)   Body mass index is 30.25 kg/m.  Wt Readings from Last 3 Encounters:  07/17/24 242 lb (109.8 kg)  05/01/24 240 lb (108.9 kg)  02/09/24 233 lb (105.7 kg)    BP Readings from Last 3 Encounters:  07/17/24 134/82  05/01/24 128/62  02/09/24 118/70     Objective:  Physical Exam Vitals and nursing note reviewed.  Constitutional:      Appearance: Normal appearance.  HENT:     Head: Normocephalic and atraumatic.  Eyes:  Extraocular Movements: Extraocular movements intact.  Cardiovascular:     Rate and Rhythm: Normal rate and regular rhythm.     Heart sounds: Normal heart sounds.  Pulmonary:     Effort: Pulmonary effort is normal.     Breath sounds: Normal breath sounds.  Musculoskeletal:     Cervical back: Normal range of motion.  Skin:    General: Skin is warm.  Neurological:     General: No focal deficit present.     Mental Status: He is alert.  Psychiatric:        Mood and Affect: Mood normal.         Assessment And Plan:  Hypertensive heart and renal disease with renal failure, stage 1 through stage 4 or unspecified chronic kidney disease, without heart failure Assessment & Plan: Blood pressure elevated at [facility], home monitoring unavailable. Today's reading high, previous within range. No dizziness or hypotension. Low dietary sodium. - Monitor blood pressure at home twice weekly, report via MyChart. - Maintain terazosin  once daily. - Consider increasing amlodipine  if hypertension persists; however, this has caused considerable LE edema in the past - Avoid high sodium foods, especially pre-appointment.   Paroxysmal atrial fibrillation (HCC) Assessment & Plan: Chronic, currently rate controlled and properly anticoagulated.    Stage 3a chronic kidney disease (HCC) Assessment & Plan: Chronic, he is encouraged to stay well hydrated, avoid NSAIDs and keep BP  controlled to prevent progression of CKD.     Pure hypercholesterolemia Assessment & Plan: Chronic, LDL goal is < 70 due to coronary calcifications. Jan 2025 results reviewed, LDL at goal.  He will continue with atorvastatin  40mg  daily. He will rto in January/Feb 2026 for his next physical examination.    Prediabetes Assessment & Plan: Previous labs reviewed, his A1c has been elevated in the past. I will check an A1c today. Reminded to avoid refined sugars including sugary drinks/foods and processed meats including bacon, sausages and deli meats.     Multiple myeloma, remission status unspecified (HCC) Assessment & Plan: He is s/p stem cell transplant. Condition well-managed, treatment postponed due to leukopenia. - Continue monitoring and treatment at [facility]. - Return to [facility] on August 11 for reassessment and potential treatment.Currently on Revlimid . Most recent Oncology notes reviewed in Care Everywhere.     Acquired thrombophilia (HCC) Assessment & Plan: Chronic, currently on Eliquis  due to underlying PAF.  - consider dose change if Cr goes above 1.5 - Most recent labs reviewed in Care Everywhere     Return if symptoms worsen or fail to improve.  Patient was given opportunity to ask questions. Patient verbalized understanding of the plan and was able to repeat key elements of the plan. All questions were answered to their satisfaction.    I, [name], MD, have reviewed all documentation for this visit. The documentation on 07/17/24 for the exam, diagnosis, procedures, and orders are all accurate and complete.   IF YOU HAVE BEEN REFERRED TO A SPECIALIST, IT MAY TAKE 1-2 WEEKS TO SCHEDULE/PROCESS THE REFERRAL. IF YOU HAVE NOT HEARD FROM US /SPECIALIST IN TWO WEEKS, PLEASE GIVE US  A CALL AT [phone].   THE PATIENT IS ENCOURAGED TO PRACTICE SOCIAL DISTANCING DUE TO THE COVID-19 PANDEMIC.

## 2024-07-17 NOTE — Patient Instructions (Signed)
 Hypertension, Adult Hypertension is another name for high blood pressure. High blood pressure forces your heart to work harder to pump blood. This can cause problems over time. There are two numbers in a blood pressure reading. There is a top number (systolic) over a bottom number (diastolic). It is best to have a blood pressure that is below 120/80. What are the causes? The cause of this condition is not known. Some other conditions can lead to high blood pressure. What increases the risk? Some lifestyle factors can make you more likely to develop high blood pressure: Smoking. Not getting enough exercise or physical activity. Being overweight. Having too much fat, sugar, calories, or salt (sodium) in your diet. Drinking too much alcohol. Other risk factors include: Having any of these conditions: Heart disease. Diabetes. High cholesterol. Kidney disease. Obstructive sleep apnea. Having a family history of high blood pressure and high cholesterol. Age. The risk increases with age. Stress. What are the signs or symptoms? High blood pressure may not cause symptoms. Very high blood pressure (hypertensive crisis) may cause: Headache. Fast or uneven heartbeats (palpitations). Shortness of breath. Nosebleed. Vomiting or feeling like you may vomit (nauseous). Changes in how you see. Very bad chest pain. Feeling dizzy. Seizures. How is this treated? This condition is treated by making healthy lifestyle changes, such as: Eating healthy foods. Exercising more. Drinking less alcohol. Your doctor may prescribe medicine if lifestyle changes do not help enough and if: Your top number is above 130. Your bottom number is above 80. Your personal target blood pressure may vary. Follow these instructions at home: Eating and drinking  If told, follow the DASH eating plan. To follow this plan: Fill one half of your plate at each meal with fruits and vegetables. Fill one fourth of your plate  at each meal with whole grains. Whole grains include whole-wheat pasta, brown rice, and whole-grain bread. Eat or drink low-fat dairy products, such as skim milk or low-fat yogurt. Fill one fourth of your plate at each meal with low-fat (lean) proteins. Low-fat proteins include fish, chicken without skin, eggs, beans, and tofu. Avoid fatty meat, cured and processed meat, or chicken with skin. Avoid pre-made or processed food. Limit the amount of salt in your diet to less than 1,500 mg each day. Do not drink alcohol if: Your doctor tells you not to drink. You are pregnant, may be pregnant, or are planning to become pregnant. If you drink alcohol: Limit how much you have to: 0-1 drink a day for women. 0-2 drinks a day for men. Know how much alcohol is in your drink. In the [place] one drink equals one 12 oz bottle of beer (355 mL), one 5 oz glass of wine (148 mL), or one 1 oz glass of hard liquor (44 mL). Lifestyle  Work with your doctor to stay at a healthy weight or to lose weight. Ask your doctor what the best weight is for you. Get at least 30 minutes of exercise that causes your heart to beat faster (aerobic exercise) most days of the week. This may include walking, swimming, or biking. Get at least 30 minutes of exercise that strengthens your muscles (resistance exercise) at least 3 days a week. This may include lifting weights or doing Pilates. Do not smoke or use any products that contain nicotine or tobacco. If you need help quitting, ask your doctor. Check your blood pressure at home as told by your doctor. Keep all follow-up visits. Medicines Take over-the-counter and prescription medicines  only as told by your doctor. Follow directions carefully. Do not skip doses of blood pressure medicine. The medicine does not work as well if you skip doses. Skipping doses also puts you at risk for problems. Ask your doctor about side effects or reactions to medicines that you should watch  for. Contact a doctor if: You think you are having a reaction to the medicine you are taking. You have headaches that keep coming back. You feel dizzy. You have swelling in your ankles. You have trouble with your vision. Get help right away if: You get a very bad headache. You start to feel mixed up (confused). You feel weak or numb. You feel faint. You have very bad pain in your: Chest. Belly (abdomen). You vomit more than once. You have trouble breathing. These symptoms may be an emergency. Get help right away. Call 911. Do not wait to see if the symptoms will go away. Do not drive yourself to the hospital. Summary Hypertension is another name for high blood pressure. High blood pressure forces your heart to work harder to pump blood. For most people, a normal blood pressure is less than 120/80. Making healthy choices can help lower blood pressure. If your blood pressure does not get lower with healthy choices, you may need to take medicine. This information is not intended to replace advice given to you by your health care provider. Make sure you discuss any questions you have with your health care provider. Document Revised: 09/16/2021 Document Reviewed: 09/16/2021 Elsevier Patient Education  2024 [employer].

## 2024-07-19 ENCOUNTER — Other Ambulatory Visit: Payer: Self-pay | Admitting: Internal Medicine

## 2024-07-22 DIAGNOSIS — D702 Other drug-induced agranulocytosis: Secondary | ICD-10-CM | POA: Diagnosis not present

## 2024-07-22 DIAGNOSIS — M25552 Pain in left hip: Secondary | ICD-10-CM | POA: Diagnosis not present

## 2024-07-22 DIAGNOSIS — K5900 Constipation, unspecified: Secondary | ICD-10-CM | POA: Diagnosis not present

## 2024-07-22 DIAGNOSIS — Z9481 Bone marrow transplant status: Secondary | ICD-10-CM | POA: Diagnosis not present

## 2024-07-22 DIAGNOSIS — C9001 Multiple myeloma in remission: Secondary | ICD-10-CM | POA: Diagnosis not present

## 2024-07-22 DIAGNOSIS — I4891 Unspecified atrial fibrillation: Secondary | ICD-10-CM | POA: Diagnosis not present

## 2024-07-22 DIAGNOSIS — I10 Essential (primary) hypertension: Secondary | ICD-10-CM | POA: Diagnosis not present

## 2024-07-22 DIAGNOSIS — G8929 Other chronic pain: Secondary | ICD-10-CM | POA: Diagnosis not present

## 2024-07-22 DIAGNOSIS — E785 Hyperlipidemia, unspecified: Secondary | ICD-10-CM | POA: Diagnosis not present

## 2024-07-22 DIAGNOSIS — D509 Iron deficiency anemia, unspecified: Secondary | ICD-10-CM | POA: Diagnosis not present

## 2024-07-23 DIAGNOSIS — H26492 Other secondary cataract, left eye: Secondary | ICD-10-CM | POA: Diagnosis not present

## 2024-07-26 ENCOUNTER — Encounter: Payer: Self-pay | Admitting: Internal Medicine

## 2024-07-26 DIAGNOSIS — M79675 Pain in left toe(s): Secondary | ICD-10-CM | POA: Diagnosis not present

## 2024-07-28 ENCOUNTER — Encounter: Payer: Self-pay | Admitting: Internal Medicine

## 2024-07-28 NOTE — Assessment & Plan Note (Addendum)
 Blood pressure elevated at [facility], home monitoring unavailable. Today's reading high, previous within range. No dizziness or hypotension. Low dietary sodium. - Monitor blood pressure at home twice weekly, report via MyChart. - Maintain terazosin  once daily. - Consider increasing amlodipine  if hypertension persists; however, this has caused considerable LE edema in the past - Avoid high sodium foods, especially pre-appointment.

## 2024-07-28 NOTE — Assessment & Plan Note (Addendum)
 Chronic, currently on Eliquis  due to underlying PAF.  - consider dose change if Cr goes above 1.5 - Most recent labs reviewed in Care Everywhere

## 2024-07-28 NOTE — Assessment & Plan Note (Addendum)
 Chronic, LDL goal is < 70 due to coronary calcifications. Jan 2025 results reviewed, LDL at goal.  He will continue with atorvastatin  40mg  daily. He will rto in January/Feb 2026 for his next physical examination.

## 2024-07-28 NOTE — Assessment & Plan Note (Signed)
Chronic, currently rate controlled and properly anticoagulated.

## 2024-07-28 NOTE — Assessment & Plan Note (Addendum)
 He is s/p stem cell transplant. Condition well-managed, treatment postponed due to leukopenia. - Continue monitoring and treatment at [facility]. - Return to [facility] on August 11 for reassessment and potential treatment.Currently on Revlimid . Most recent Oncology notes reviewed in Care Everywhere.

## 2024-07-28 NOTE — Assessment & Plan Note (Signed)
 Chronic, he is encouraged to stay well hydrated, avoid NSAIDs and keep BP controlled to prevent progression of CKD.

## 2024-07-28 NOTE — Assessment & Plan Note (Signed)
 Previous labs reviewed, his A1c has been elevated in the past. I will check an A1c today. Reminded to avoid refined sugars including sugary drinks/foods and processed meats including bacon, sausages and deli meats.

## 2024-08-20 DIAGNOSIS — H26491 Other secondary cataract, right eye: Secondary | ICD-10-CM | POA: Diagnosis not present

## 2024-08-26 DIAGNOSIS — D702 Other drug-induced agranulocytosis: Secondary | ICD-10-CM | POA: Diagnosis not present

## 2024-08-26 DIAGNOSIS — Z8639 Personal history of other endocrine, nutritional and metabolic disease: Secondary | ICD-10-CM | POA: Diagnosis not present

## 2024-08-26 DIAGNOSIS — Z9481 Bone marrow transplant status: Secondary | ICD-10-CM | POA: Diagnosis not present

## 2024-08-26 DIAGNOSIS — G893 Neoplasm related pain (acute) (chronic): Secondary | ICD-10-CM | POA: Diagnosis not present

## 2024-08-26 DIAGNOSIS — C9001 Multiple myeloma in remission: Secondary | ICD-10-CM | POA: Diagnosis not present

## 2024-09-18 ENCOUNTER — Other Ambulatory Visit: Payer: Self-pay | Admitting: Internal Medicine

## 2024-09-19 ENCOUNTER — Other Ambulatory Visit: Payer: Self-pay | Admitting: Internal Medicine

## 2024-09-30 DIAGNOSIS — D702 Other drug-induced agranulocytosis: Secondary | ICD-10-CM | POA: Diagnosis not present

## 2024-09-30 DIAGNOSIS — G8929 Other chronic pain: Secondary | ICD-10-CM | POA: Diagnosis not present

## 2024-09-30 DIAGNOSIS — D649 Anemia, unspecified: Secondary | ICD-10-CM | POA: Diagnosis not present

## 2024-09-30 DIAGNOSIS — Z9481 Bone marrow transplant status: Secondary | ICD-10-CM | POA: Diagnosis not present

## 2024-09-30 DIAGNOSIS — Z8639 Personal history of other endocrine, nutritional and metabolic disease: Secondary | ICD-10-CM | POA: Diagnosis not present

## 2024-09-30 DIAGNOSIS — C9001 Multiple myeloma in remission: Secondary | ICD-10-CM | POA: Diagnosis not present

## 2024-09-30 DIAGNOSIS — M25552 Pain in left hip: Secondary | ICD-10-CM | POA: Diagnosis not present

## 2024-09-30 DIAGNOSIS — G893 Neoplasm related pain (acute) (chronic): Secondary | ICD-10-CM | POA: Diagnosis not present

## 2024-10-07 DIAGNOSIS — D649 Anemia, unspecified: Secondary | ICD-10-CM | POA: Diagnosis not present

## 2024-10-07 DIAGNOSIS — C9001 Multiple myeloma in remission: Secondary | ICD-10-CM | POA: Diagnosis not present

## 2024-10-07 DIAGNOSIS — Z8639 Personal history of other endocrine, nutritional and metabolic disease: Secondary | ICD-10-CM | POA: Diagnosis not present

## 2024-10-15 ENCOUNTER — Other Ambulatory Visit: Payer: Self-pay | Admitting: Internal Medicine

## 2024-10-16 ENCOUNTER — Other Ambulatory Visit: Payer: Self-pay | Admitting: Internal Medicine

## 2024-10-17 ENCOUNTER — Ambulatory Visit: Admitting: Internal Medicine

## 2024-10-21 ENCOUNTER — Encounter: Payer: Self-pay | Admitting: Internal Medicine

## 2024-10-21 ENCOUNTER — Ambulatory Visit (INDEPENDENT_AMBULATORY_CARE_PROVIDER_SITE_OTHER): Payer: Self-pay | Admitting: Internal Medicine

## 2024-10-21 VITALS — BP 130/70 | HR 70 | Temp 98.5°F | Ht 75.0 in | Wt 246.4 lb

## 2024-10-21 DIAGNOSIS — R7303 Prediabetes: Secondary | ICD-10-CM | POA: Diagnosis not present

## 2024-10-21 DIAGNOSIS — I1310 Hypertensive heart and chronic kidney disease without heart failure, with stage 1 through stage 4 chronic kidney disease, or unspecified chronic kidney disease: Secondary | ICD-10-CM

## 2024-10-21 DIAGNOSIS — N1831 Chronic kidney disease, stage 3a: Secondary | ICD-10-CM | POA: Diagnosis not present

## 2024-10-21 DIAGNOSIS — C9000 Multiple myeloma not having achieved remission: Secondary | ICD-10-CM

## 2024-10-21 DIAGNOSIS — I480 Paroxysmal atrial fibrillation: Secondary | ICD-10-CM

## 2024-10-21 DIAGNOSIS — E7800 Pure hypercholesterolemia, unspecified: Secondary | ICD-10-CM

## 2024-10-21 MED ORDER — TERAZOSIN HCL 10 MG PO CAPS: 10.0000 mg | ORAL_CAPSULE | Freq: Every day | ORAL | 2 refills | Status: AC

## 2024-10-21 NOTE — Assessment & Plan Note (Addendum)
 He is s/p stem cell transplant. Condition well-managed by [facility]. - Continue monitoring and treatment at [facility]. - Recent Oncology notes reviewed in Care Everywhere. - Continue with Revlimid .

## 2024-10-21 NOTE — Assessment & Plan Note (Addendum)
 Chronic, currently rate controlled and properly anticoagulated. No palpitations or rapid heart rate. On Eliquis  for anticoagulation. - Continue Eliquis  as prescribed.

## 2024-10-21 NOTE — Assessment & Plan Note (Signed)
 Previous labs reviewed, his A1c has been elevated in the past. I will check an A1c today. Reminded to avoid refined sugars including sugary drinks/foods and processed meats including bacon, sausages and deli meats.

## 2024-10-21 NOTE — Progress Notes (Signed)
 I,Edward Gallagher, CMA,acting as a neurosurgeon for Edward LOISE Slocumb, MD.,have documented all relevant documentation on the behalf of Edward LOISE Slocumb, MD,as directed by  Edward LOISE Slocumb, MD while in the presence of Edward LOISE Slocumb, MD.  Subjective:  Patient ID: Edward Gallagher , male    DOB: [birth year] , 83 y.o.   MRN: [id number]  Chief Complaint  Patient presents with   Hypertension    Patient presents today for bp, predm & cholesterol follow up. He reports compliance with medications. Denies headache, chest pain & sob.   Prediabetes   Hyperlipidemia    HPI  Hypertension  Hyperlipidemia     Past Medical History:  Diagnosis Date   A-fib (HCC)    Allergy    Arthritis    Benign hypertension with CKD (chronic kidney disease) stage III (HCC) 01/15/2024   Encounter for general adult medical examination w/o abnormal findings 01/15/2024   Hyperlipidemia    Hypertension    Multiple myeloma (HCC)    Prostatic hypertrophy    Sinus trouble    Sleep apnea    CPAP   Wears dentures    full upper, partial lower     Family History  Problem Relation Age of Onset   Healthy Mother    Hypertension Mother    Prostate cancer Father    Cancer Father    Testicular cancer Brother    Hypertension Brother    Prostate cancer Brother    Lung cancer Brother    Cancer Brother    Cancer Brother        double mynoma   Hypertension Other    Cancer Son      Current Outpatient Medications:    acetaminophen  (TYLENOL ) 500 MG tablet, Take 500 mg by mouth as needed., Disp: , Rfl:    acyclovir  (ZOVIRAX ) 400 MG tablet, TAKE 1 TABLET(400 MG) BY MOUTH EVERY 12 HOURS, Disp: 180 tablet, Rfl: 0   amLODipine  (NORVASC ) 2.5 MG tablet, Take 1 tablet (2.5 mg total) by mouth daily., Disp: 90 tablet, Rfl: 3   atorvastatin  (LIPITOR) 40 MG tablet, Take 1 tablet (40 mg total) by mouth daily., Disp: 90 tablet, Rfl: 3   cetirizine (ZYRTEC) 10 MG tablet, Take 10 mg by mouth daily., Disp: , Rfl:    ELIQUIS  5 MG TABS tablet,  TAKE 1 TABLET BY MOUTH TWICE  DAILY, Disp: 200 tablet, Rfl: 2   finasteride  (PROSCAR ) 5 MG tablet, Take 5 mg by mouth daily., Disp: , Rfl:    fluticasone  (FLONASE ) 50 MCG/ACT nasal spray, SHAKE LIQUID AND USE 1 SPRAY IN EACH NOSTRIL DAILY, Disp: 16 g, Rfl: 2   furosemide  (LASIX ) 20 MG tablet, TAKE 1 TABLET(20 MG) BY MOUTH DAILY, Disp: 90 tablet, Rfl: 2   lenalidomide  (REVLIMID ) 5 MG capsule, Take 5 mg by mouth daily., Disp: , Rfl:    LINZESS  145 MCG CAPS capsule, TAKE 1 CAPSULE(145 MCG) BY MOUTH DAILY BEFORE BREAKFAST, Disp: 90 capsule, Rfl: 2   Misc Natural Products (NEURIVA PO), Take by mouth at bedtime., Disp: , Rfl:    nitroGLYCERIN  (NITROSTAT ) 0.4 MG SL tablet, Place 1 tablet (0.4 mg total) under the tongue every 5 (five) minutes as needed for chest pain., Disp: 30 tablet, Rfl: 3   NON FORMULARY, NEURIVA BRAIN HEALTH, Disp: , Rfl:    senna-docusate (SENOKOT-S) 8.6-50 MG tablet, Take 1 tablet by mouth 2 (two) times daily., Disp: , Rfl:    terazosin  (HYTRIN ) 10 MG capsule, Take 1 capsule (10 mg total)  by mouth at bedtime., Disp: 90 capsule, Rfl: 2   traMADol  (ULTRAM ) 50 MG tablet, Take 1 tablet by mouth every 6 (six) hours as needed., Disp: , Rfl:    triamcinolone  (NASACORT ) 55 MCG/ACT AERO nasal inhaler, Place 2 sprays into the nose daily., Disp: , Rfl:    Xylitol (XYLIMELTS MT), Use as directed in the mouth or throat at bedtime., Disp: , Rfl:    No Known Allergies   Review of Systems  Constitutional: Negative.   Respiratory: Negative.    Gastrointestinal: Negative.   Endocrine: Negative.   Skin: Negative.   Allergic/Immunologic: Negative.   Hematological: Negative.      Today's Vitals   10/21/24 1410  BP: 130/70  Pulse: 70  Temp: 98.5 F (36.9 C)  SpO2: 98%  Weight: 246 lb 6.4 oz (111.8 kg)  Height: 6' 3 (1.905 m)   Body mass index is 30.8 kg/m.  Wt Readings from Last 3 Encounters:  10/21/24 246 lb 6.4 oz (111.8 kg)  07/17/24 242 lb (109.8 kg)  05/01/24 240 lb (108.9  kg)     Objective:  Physical Exam      Assessment And Plan:  Hypertensive heart and renal disease with renal failure, stage 1 through stage 4 or unspecified chronic kidney disease, without heart failure -     Terazosin  HCl; Take 1 capsule (10 mg total) by mouth at bedtime.  Dispense: 90 capsule; Refill: 2  Prediabetes  Pure hypercholesterolemia  Paroxysmal atrial fibrillation (HCC)  Stage 3a chronic kidney disease (HCC)  Multiple myeloma, remission status unspecified (HCC)     Return if symptoms worsen or fail to improve.  Patient was given opportunity to ask questions. Patient verbalized understanding of the plan and was able to repeat key elements of the plan. All questions were answered to their satisfaction.  Edward LOISE Slocumb, MD  I, Edward LOISE Slocumb, MD, have reviewed all documentation for this visit. The documentation on 10/21/24 for the exam, diagnosis, procedures, and orders are all accurate and complete.   IF YOU HAVE BEEN REFERRED TO A SPECIALIST, IT MAY TAKE 1-2 WEEKS TO SCHEDULE/PROCESS THE REFERRAL. IF YOU HAVE NOT HEARD FROM US /SPECIALIST IN TWO WEEKS, PLEASE GIVE US  A CALL AT [phone].   THE PATIENT IS ENCOURAGED TO PRACTICE SOCIAL DISTANCING DUE TO THE COVID-19 PANDEMIC.

## 2024-10-21 NOTE — Assessment & Plan Note (Signed)
 Chronic, he is encouraged to stay well hydrated, avoid NSAIDs and keep BP controlled to prevent progression of CKD.

## 2024-10-21 NOTE — Patient Instructions (Signed)
 Hypertension, Adult Hypertension is another name for high blood pressure. High blood pressure forces your heart to work harder to pump blood. This can cause problems over time. There are two numbers in a blood pressure reading. There is a top number (systolic) over a bottom number (diastolic). It is best to have a blood pressure that is below 120/80. What are the causes? The cause of this condition is not known. Some other conditions can lead to high blood pressure. What increases the risk? Some lifestyle factors can make you more likely to develop high blood pressure: Smoking. Not getting enough exercise or physical activity. Being overweight. Having too much fat, sugar, calories, or salt (sodium) in your diet. Drinking too much alcohol. Other risk factors include: Having any of these conditions: Heart disease. Diabetes. High cholesterol. Kidney disease. Obstructive sleep apnea. Having a family history of high blood pressure and high cholesterol. Age. The risk increases with age. Stress. What are the signs or symptoms? High blood pressure may not cause symptoms. Very high blood pressure (hypertensive crisis) may cause: Headache. Fast or uneven heartbeats (palpitations). Shortness of breath. Nosebleed. Vomiting or feeling like you may vomit (nauseous). Changes in how you see. Very bad chest pain. Feeling dizzy. Seizures. How is this treated? This condition is treated by making healthy lifestyle changes, such as: Eating healthy foods. Exercising more. Drinking less alcohol. Your doctor may prescribe medicine if lifestyle changes do not help enough and if: Your top number is above 130. Your bottom number is above 80. Your personal target blood pressure may vary. Follow these instructions at home: Eating and drinking  If told, follow the DASH eating plan. To follow this plan: Fill one half of your plate at each meal with fruits and vegetables. Fill one fourth of your plate  at each meal with whole grains. Whole grains include whole-wheat pasta, brown rice, and whole-grain bread. Eat or drink low-fat dairy products, such as skim milk or low-fat yogurt. Fill one fourth of your plate at each meal with low-fat (lean) proteins. Low-fat proteins include fish, chicken without skin, eggs, beans, and tofu. Avoid fatty meat, cured and processed meat, or chicken with skin. Avoid pre-made or processed food. Limit the amount of salt in your diet to less than 1,500 mg each day. Do not drink alcohol if: Your doctor tells you not to drink. You are pregnant, may be pregnant, or are planning to become pregnant. If you drink alcohol: Limit how much you have to: 0-1 drink a day for women. 0-2 drinks a day for men. Know how much alcohol is in your drink. In the [place] one drink equals one 12 oz bottle of beer (355 mL), one 5 oz glass of wine (148 mL), or one 1 oz glass of hard liquor (44 mL). Lifestyle  Work with your doctor to stay at a healthy weight or to lose weight. Ask your doctor what the best weight is for you. Get at least 30 minutes of exercise that causes your heart to beat faster (aerobic exercise) most days of the week. This may include walking, swimming, or biking. Get at least 30 minutes of exercise that strengthens your muscles (resistance exercise) at least 3 days a week. This may include lifting weights or doing Pilates. Do not smoke or use any products that contain nicotine or tobacco. If you need help quitting, ask your doctor. Check your blood pressure at home as told by your doctor. Keep all follow-up visits. Medicines Take over-the-counter and prescription medicines  only as told by your doctor. Follow directions carefully. Do not skip doses of blood pressure medicine. The medicine does not work as well if you skip doses. Skipping doses also puts you at risk for problems. Ask your doctor about side effects or reactions to medicines that you should watch  for. Contact a doctor if: You think you are having a reaction to the medicine you are taking. You have headaches that keep coming back. You feel dizzy. You have swelling in your ankles. You have trouble with your vision. Get help right away if: You get a very bad headache. You start to feel mixed up (confused). You feel weak or numb. You feel faint. You have very bad pain in your: Chest. Belly (abdomen). You vomit more than once. You have trouble breathing. These symptoms may be an emergency. Get help right away. Call 911. Do not wait to see if the symptoms will go away. Do not drive yourself to the hospital. Summary Hypertension is another name for high blood pressure. High blood pressure forces your heart to work harder to pump blood. For most people, a normal blood pressure is less than 120/80. Making healthy choices can help lower blood pressure. If your blood pressure does not get lower with healthy choices, you may need to take medicine. This information is not intended to replace advice given to you by your health care provider. Make sure you discuss any questions you have with your health care provider. Document Revised: 09/16/2021 Document Reviewed: 09/16/2021 Elsevier Patient Education  2024 [employer].

## 2024-10-21 NOTE — Assessment & Plan Note (Signed)
 Chronic, LDL goal is < 70 due to coronary calcifications. Jan 2025 results reviewed, LDL at goal.  He will continue with atorvastatin  40mg  daily. He will rto in Feb 2026 for his next physical examination.

## 2024-10-21 NOTE — Assessment & Plan Note (Addendum)
 Chronic, well controlled.  He will continue with both amlodipine  2.5mg  and furosemide  daily.  - Continue with current meds.  - Follow up in February 2026 for his physical exam.  - Advised to skip amlodipine  if BP <120 mmHg or if lightheaded/dizzy. - Ordered liver and kidney function tests

## 2024-10-21 NOTE — Progress Notes (Signed)
 I,Edward Gallagher, CMA,acting as a neurosurgeon for Edward LOISE Slocumb, MD.,have documented all relevant documentation on the behalf of Edward LOISE Slocumb, MD,as directed by  Edward LOISE Slocumb, MD while in the presence of Edward LOISE Slocumb, MD.  Subjective:  Patient ID: Edward Gallagher , male    DOB: May 27, 1941 , 83 y.o.   MRN: [id number]  Chief Complaint  Patient presents with   Hypertension    Patient presents today for bp, predm & cholesterol follow up. He reports compliance with medications. Denies headache, chest pain & sob.   Prediabetes   Hyperlipidemia    HPI Discussed the use of AI scribe software for clinical note transcription with the patient, who gave verbal consent to proceed.  History of Present Illness Edward Gallagher is an 83 year old male with hypertension who presents for a blood pressure check.  He is not currently monitoring his blood pressure at home, but notes that his last readings were improved. He reports ankle swelling upon removing his compression stockings and states that he wears them every day. No lightheadedness or dizziness is present.  His current medications include amlodipine  2.5 mg, atorvastatin  40 mg, Eliquis , finasteride , Flonase  as needed, furosemide  20 mg daily, Revlimid , Linzess , and terazosin . He recently increased his terazosin  dosage to two 5 mg tablets daily, though he is unsure of the reason for this change.  He confirms that his last iron infusion was in May and has another scheduled for tomorrow. No increased fatigue is reported.  He reports regular bowel movements, sometimes twice a day, which he attributes to Linzess .  He received both the flu and COVID vaccines at a pharmacy on October 6th, assuming the COVID vaccine was [employer]. No palpitations or urinary hesitancy are reported.   Hypertension This is a chronic problem. The current episode started more than 1 year ago. The problem has been gradually improving since onset. The problem is controlled.  Pertinent negatives include no blurred vision, chest pain, palpitations or shortness of breath. Risk factors for coronary artery disease include male gender and obesity. Identifiable causes of hypertension include chronic renal disease.  Hyperlipidemia This is a chronic problem. The current episode started more than 1 year ago. Exacerbating diseases include chronic renal disease. He has no history of obesity. Pertinent negatives include no chest pain or shortness of breath.     Past Medical History:  Diagnosis Date   A-fib [facility])    Allergy    Arthritis    Benign hypertension with CKD (chronic kidney disease) stage III (HCC) 01/15/2024   Encounter for general adult medical examination w/o abnormal findings 01/15/2024   Hyperlipidemia    Hypertension    Multiple myeloma (HCC)    Prostatic hypertrophy    Sinus trouble    Sleep apnea    CPAP   Wears dentures    full upper, partial lower     Family History  Problem Relation Age of Onset   Healthy Mother    Hypertension Mother    Prostate cancer Father    Cancer Father    Testicular cancer Brother    Hypertension Brother    Prostate cancer Brother    Lung cancer Brother    Cancer Brother    Cancer Brother        double mynoma   Hypertension Other    Cancer Son      Current Outpatient Medications:    acetaminophen  (TYLENOL ) 500 MG tablet, Take 500 mg by mouth as needed., Disp: ,  Rfl:    acyclovir  (ZOVIRAX ) 400 MG tablet, TAKE 1 TABLET(400 MG) BY MOUTH EVERY 12 HOURS, Disp: 180 tablet, Rfl: 0   amLODipine  (NORVASC ) 2.5 MG tablet, Take 1 tablet (2.5 mg total) by mouth daily., Disp: 90 tablet, Rfl: 3   atorvastatin  (LIPITOR) 40 MG tablet, Take 1 tablet (40 mg total) by mouth daily., Disp: 90 tablet, Rfl: 3   cetirizine (ZYRTEC) 10 MG tablet, Take 10 mg by mouth daily., Disp: , Rfl:    ELIQUIS  5 MG TABS tablet, TAKE 1 TABLET BY MOUTH TWICE  DAILY, Disp: 200 tablet, Rfl: 2   finasteride  (PROSCAR ) 5 MG tablet, Take 5 mg by mouth  daily., Disp: , Rfl:    fluticasone  (FLONASE ) 50 MCG/ACT nasal spray, SHAKE LIQUID AND USE 1 SPRAY IN EACH NOSTRIL DAILY, Disp: 16 g, Rfl: 2   furosemide  (LASIX ) 20 MG tablet, TAKE 1 TABLET(20 MG) BY MOUTH DAILY, Disp: 90 tablet, Rfl: 2   lenalidomide  (REVLIMID ) 5 MG capsule, Take 5 mg by mouth daily., Disp: , Rfl:    LINZESS  145 MCG CAPS capsule, TAKE 1 CAPSULE(145 MCG) BY MOUTH DAILY BEFORE BREAKFAST, Disp: 90 capsule, Rfl: 2   Misc Natural Products (NEURIVA PO), Take by mouth at bedtime., Disp: , Rfl:    nitroGLYCERIN  (NITROSTAT ) 0.4 MG SL tablet, Place 1 tablet (0.4 mg total) under the tongue every 5 (five) minutes as needed for chest pain., Disp: 30 tablet, Rfl: 3   NON FORMULARY, NEURIVA BRAIN HEALTH, Disp: , Rfl:    senna-docusate (SENOKOT-S) 8.6-50 MG tablet, Take 1 tablet by mouth 2 (two) times daily., Disp: , Rfl:    terazosin  (HYTRIN ) 10 MG capsule, Take 1 capsule (10 mg total) by mouth at bedtime., Disp: 90 capsule, Rfl: 2   traMADol  (ULTRAM ) 50 MG tablet, Take 1 tablet by mouth every 6 (six) hours as needed., Disp: , Rfl:    triamcinolone  (NASACORT ) 55 MCG/ACT AERO nasal inhaler, Place 2 sprays into the nose daily., Disp: , Rfl:    Xylitol (XYLIMELTS MT), Use as directed in the mouth or throat at bedtime., Disp: , Rfl:    No Known Allergies   Review of Systems  Constitutional: Negative.   Eyes:  Negative for blurred vision.  Respiratory: Negative.  Negative for shortness of breath.   Cardiovascular:  Negative for chest pain and palpitations.  Gastrointestinal: Negative.   Endocrine: Negative.   Skin: Negative.   Allergic/Immunologic: Negative.   Hematological: Negative.      Today's Vitals   10/21/24 1410  BP: 130/70  Pulse: 70  Temp: 98.5 F (36.9 C)  SpO2: 98%  Weight: 246 lb 6.4 oz (111.8 kg)  Height: 6' 3 (1.905 m)   Body mass index is 30.8 kg/m.  Wt Readings from Last 3 Encounters:  10/21/24 246 lb 6.4 oz (111.8 kg)  07/17/24 242 lb (109.8 kg)  05/01/24  240 lb (108.9 kg)    BP Readings from Last 3 Encounters:  10/21/24 130/70  07/17/24 134/82  05/01/24 128/62    Objective:  Physical Exam Vitals and nursing note reviewed.  Constitutional:      Appearance: Normal appearance.  HENT:     Head: Normocephalic and atraumatic.  Eyes:     Extraocular Movements: Extraocular movements intact.  Cardiovascular:     Rate and Rhythm: Normal rate and regular rhythm.     Heart sounds: Normal heart sounds.  Pulmonary:     Effort: Pulmonary effort is normal.     Breath sounds: Normal breath sounds.  Musculoskeletal:     Cervical back: Normal range of motion.  Skin:    General: Skin is warm.  Neurological:     General: No focal deficit present.     Mental Status: He is alert.  Psychiatric:        Mood and Affect: Mood normal.         Assessment And Plan:  Hypertensive heart and renal disease with renal failure, stage 1 through stage 4 or unspecified chronic kidney disease, without heart failure Assessment & Plan: Chronic, well controlled.  He will continue with both amlodipine  2.5mg  and furosemide  daily.  - Continue with current meds.  - Follow up in February 2026 for his physical exam.  - Advised to skip amlodipine  if BP <120 mmHg or if lightheaded/dizzy. - Ordered liver and kidney function tests  Orders: -     Terazosin  HCl; Take 1 capsule (10 mg total) by mouth at bedtime.  Dispense: 90 capsule; Refill: 2 -     CMP14+EGFR  Paroxysmal atrial fibrillation (HCC) Assessment & Plan: Chronic, currently rate controlled and properly anticoagulated. No palpitations or rapid heart rate. On Eliquis  for anticoagulation. - Continue Eliquis  as prescribed.   Stage 3a chronic kidney disease (HCC) Assessment & Plan: Chronic, he is encouraged to stay well hydrated, avoid NSAIDs and keep BP controlled to prevent progression of CKD.     Prediabetes Assessment & Plan: Previous labs reviewed, his A1c has been elevated in the past. I will  check an A1c today. Reminded to avoid refined sugars including sugary drinks/foods and processed meats including bacon, sausages and deli meats.    Orders: -     CMP14+EGFR -     Hemoglobin A1c  Pure hypercholesterolemia Assessment & Plan: Chronic, LDL goal is < 70 due to coronary calcifications. Jan 2025 results reviewed, LDL at goal.  He will continue with atorvastatin  40mg  daily. He will rto in Feb 2026 for his next physical examination.   Orders: -     CMP14+EGFR  Multiple myeloma, remission status unspecified (HCC) Assessment & Plan: He is s/p stem cell transplant. Condition well-managed by [facility]. - Continue monitoring and treatment at [facility]. - Recent Oncology notes reviewed in Care Everywhere. - Continue with Revlimid .      Return if symptoms worsen or fail to improve.  Patient was given opportunity to ask questions. Patient verbalized understanding of the plan and was able to repeat key elements of the plan. All questions were answered to their satisfaction.   I, Edward LOISE Slocumb, MD, have reviewed all documentation for this visit. The documentation on 10/21/24 for the exam, diagnosis, procedures, and orders are all accurate and complete.   IF YOU HAVE BEEN REFERRED TO A SPECIALIST, IT MAY TAKE 1-2 WEEKS TO SCHEDULE/PROCESS THE REFERRAL. IF YOU HAVE NOT HEARD FROM US /SPECIALIST IN TWO WEEKS, PLEASE GIVE US  A CALL AT [phone] X [phone].   THE PATIENT IS ENCOURAGED TO PRACTICE SOCIAL DISTANCING DUE TO THE COVID-19 PANDEMIC.

## 2024-10-22 ENCOUNTER — Ambulatory Visit: Payer: Self-pay | Admitting: Internal Medicine

## 2024-10-22 LAB — CMP14+EGFR
ALT: 18 IU/L (ref 0–44)
AST: 17 IU/L (ref 0–40)
Albumin: 4.1 g/dL (ref 3.7–4.7)
Alkaline Phosphatase: 75 IU/L (ref 48–129)
BUN/Creatinine Ratio: 8 — ABNORMAL LOW (ref 10–24)
BUN: 13 mg/dL (ref 8–27)
Bilirubin Total: 0.9 mg/dL (ref 0.0–1.2)
CO2: 26 mmol/L (ref 20–29)
Calcium: 9.3 mg/dL (ref 8.6–10.2)
Chloride: 107 mmol/L — ABNORMAL HIGH (ref 96–106)
Creatinine, Ser: 1.66 mg/dL — ABNORMAL HIGH (ref 0.76–1.27)
Globulin, Total: 1.8 g/dL (ref 1.5–4.5)
Glucose: 89 mg/dL (ref 70–99)
Potassium: 4.1 mmol/L (ref 3.5–5.2)
Sodium: 146 mmol/L — ABNORMAL HIGH (ref 134–144)
Total Protein: 5.9 g/dL — ABNORMAL LOW (ref 6.0–8.5)
eGFR: 41 mL/min/1.73 — ABNORMAL LOW (ref >59)

## 2024-10-22 LAB — HEMOGLOBIN A1C
Est. average glucose Bld gHb Est-mCnc: 117 mg/dL
Hgb A1c MFr Bld: 5.7 % — ABNORMAL HIGH (ref 4.8–5.6)

## 2024-12-15 ENCOUNTER — Other Ambulatory Visit: Payer: Self-pay | Admitting: Cardiology

## 2024-12-15 DIAGNOSIS — I480 Paroxysmal atrial fibrillation: Secondary | ICD-10-CM

## 2024-12-16 NOTE — Telephone Encounter (Signed)
 Eliquis  5mg  refill request received. Patient is 84 years old, weight-111.8kg, Crea-1.66 on 10/21/24 and 1.6 on 11/18/24 via Care Everywhere from [facility] and 1.3 on 11/11/24 via Care Everywhere, Diagnosis-Afib, and last seen by Dr. Elmira on 02/09/24. Dose is inappropriate based on dosing criteria.  Will send into pharmacist to review doseage.

## 2024-12-17 MED ORDER — APIXABAN 2.5 MG PO TABS: 2.5000 mg | ORAL_TABLET | Freq: Two times a day (BID) | ORAL | 1 refills | Status: AC

## 2024-12-17 NOTE — Telephone Encounter (Signed)
 Called patient and advised taht the dose of eliquis  needs to be reduced from 5mg  to 2.5mg  twice a day due to dosing parameters. He is aware the reduced dose will be taken the same. Advised will send in at this time and he was appreciative and wished a Happy New Year.

## 2024-12-31 LAB — COMPREHENSIVE METABOLIC PANEL WITH GFR
Albumin: 4 (ref 3.5–5.0)
Calcium: 9.2 (ref 8.7–10.7)

## 2024-12-31 LAB — BASIC METABOLIC PANEL WITH GFR
BUN: 18 (ref 4–21)
CO2: 30 — AB (ref 13–22)
Chloride: 107 (ref 99–108)
Creatinine: 1.4 — AB (ref 0.6–1.3)
Glucose: 90
Potassium: 4.2 meq/L (ref 3.5–5.1)
Sodium: 142 (ref 137–147)

## 2024-12-31 LAB — HEPATIC FUNCTION PANEL
ALT: 24 U/L (ref 10–40)
AST: 17 (ref 14–40)
Alkaline Phosphatase: 57 (ref 25–125)
Bilirubin, Total: 1

## 2025-01-01 ENCOUNTER — Other Ambulatory Visit: Payer: Self-pay | Admitting: Cardiology

## 2025-01-01 DIAGNOSIS — I480 Paroxysmal atrial fibrillation: Secondary | ICD-10-CM

## 2025-01-09 ENCOUNTER — Other Ambulatory Visit: Payer: Self-pay | Admitting: Internal Medicine

## 2025-01-13 ENCOUNTER — Encounter: Payer: Medicare Other | Admitting: Internal Medicine

## 2025-01-15 ENCOUNTER — Ambulatory Visit: Payer: Medicare Other | Admitting: Internal Medicine

## 2025-01-15 ENCOUNTER — Encounter: Payer: Self-pay | Admitting: Internal Medicine

## 2025-01-15 VITALS — BP 130/78 | HR 64 | Temp 98.1°F | Ht 75.0 in | Wt 243.4 lb

## 2025-01-15 DIAGNOSIS — I1310 Hypertensive heart and chronic kidney disease without heart failure, with stage 1 through stage 4 chronic kidney disease, or unspecified chronic kidney disease: Secondary | ICD-10-CM

## 2025-01-15 DIAGNOSIS — R7303 Prediabetes: Secondary | ICD-10-CM

## 2025-01-15 DIAGNOSIS — I480 Paroxysmal atrial fibrillation: Secondary | ICD-10-CM

## 2025-01-15 DIAGNOSIS — Z0000 Encounter for general adult medical examination without abnormal findings: Secondary | ICD-10-CM

## 2025-01-15 DIAGNOSIS — C9000 Multiple myeloma not having achieved remission: Secondary | ICD-10-CM

## 2025-01-15 DIAGNOSIS — E7800 Pure hypercholesterolemia, unspecified: Secondary | ICD-10-CM

## 2025-01-15 DIAGNOSIS — N1831 Chronic kidney disease, stage 3a: Secondary | ICD-10-CM

## 2025-01-15 LAB — POCT URINALYSIS DIPSTICK
Bilirubin, UA: NEGATIVE
Blood, UA: NEGATIVE
Glucose, UA: NEGATIVE
Ketones, UA: NEGATIVE
Leukocytes, UA: NEGATIVE
Nitrite, UA: NEGATIVE
Protein, UA: NEGATIVE
Spec Grav, UA: 1.02
Urobilinogen, UA: 0.2 U/dL
pH, UA: 6

## 2025-01-15 MED ORDER — FUROSEMIDE 20 MG PO TABS: 20.0000 mg | ORAL_TABLET | Freq: Every day | ORAL | 2 refills | Status: AC

## 2025-01-15 MED ORDER — FLUTICASONE PROPIONATE 50 MCG/ACT NA SUSP: NASAL | 2 refills | Status: AC

## 2025-01-15 NOTE — Assessment & Plan Note (Addendum)
" °  Orders:   Lipid panel   POCT Urinalysis Dipstick (81002)   Microalbumin / Creatinine Urine Ratio   EKG 12-Lead  "

## 2025-01-15 NOTE — Assessment & Plan Note (Addendum)
 Chronic, I will refer him for renal ultrasound. I will also check labwork as below.  Orders:   Parathyroid Hormone, Intact w/Ca   Phosphorus   US  Renal; Future

## 2025-01-15 NOTE — Assessment & Plan Note (Addendum)
 SABRA

## 2025-01-15 NOTE — Assessment & Plan Note (Addendum)
 Orders:    Lipid panel

## 2025-01-15 NOTE — Patient Instructions (Signed)
 Health Maintenance, Male  Adopting a healthy lifestyle and getting preventive care are important in promoting health and wellness. Ask your health care provider about:  The right schedule for you to have regular tests and exams.  Things you can do on your own to prevent diseases and keep yourself healthy.  What should I know about diet, weight, and exercise?  Eat a healthy diet    Eat a diet that includes plenty of vegetables, fruits, low-fat dairy products, and lean protein.  Do not eat a lot of foods that are high in solid fats, added sugars, or sodium.  Maintain a healthy weight  Body mass index (BMI) is a measurement that can be used to identify possible weight problems. It estimates body fat based on height and weight. Your health care provider can help determine your BMI and help you achieve or maintain a healthy weight.  Get regular exercise  Get regular exercise. This is one of the most important things you can do for your health. Most adults should:  Exercise for at least 150 minutes each week. The exercise should increase your heart rate and make you sweat (moderate-intensity exercise).  Do strengthening exercises at least twice a week. This is in addition to the moderate-intensity exercise.  Spend less time sitting. Even light physical activity can be beneficial.  Watch cholesterol and blood lipids  Have your blood tested for lipids and cholesterol at 83 years of age, then have this test every 5 years.  You may need to have your cholesterol levels checked more often if:  Your lipid or cholesterol levels are high.  You are older than 83 years of age.  You are at high risk for heart disease.  What should I know about cancer screening?  Many types of cancers can be detected early and may often be prevented. Depending on your health history and family history, you may need to have cancer screening at various ages. This may include screening for:  Colorectal cancer.  Prostate cancer.  Skin cancer.  Lung  cancer.  What should I know about heart disease, diabetes, and high blood pressure?  Blood pressure and heart disease  High blood pressure causes heart disease and increases the risk of stroke. This is more likely to develop in people who have high blood pressure readings or are overweight.  Talk with your health care provider about your target blood pressure readings.  Have your blood pressure checked:  Every 3-5 years if you are 24-52 years of age.  Every year if you are 3 years old or older.  If you are between the ages of 60 and 72 and are a current or former smoker, ask your health care provider if you should have a one-time screening for abdominal aortic aneurysm (AAA).  Diabetes  Have regular diabetes screenings. This checks your fasting blood sugar level. Have the screening done:  Once every three years after age 66 if you are at a normal weight and have a low risk for diabetes.  More often and at a younger age if you are overweight or have a high risk for diabetes.  What should I know about preventing infection?  Hepatitis B  If you have a higher risk for hepatitis B, you should be screened for this virus. Talk with your health care provider to find out if you are at risk for hepatitis B infection.  Hepatitis C  Blood testing is recommended for:  Everyone born from [birth year] through 1964.  Anyone  with known risk factors for hepatitis C.  Sexually transmitted infections (STIs)  You should be screened each year for STIs, including gonorrhea and chlamydia, if:  You are sexually active and are younger than 83 years of age.  You are older than 83 years of age and your health care provider tells you that you are at risk for this type of infection.  Your sexual activity has changed since you were last screened, and you are at increased risk for chlamydia or gonorrhea. Ask your health care provider if you are at risk.  Ask your health care provider about whether you are at high risk for HIV. Your health care provider  may recommend a prescription medicine to help prevent HIV infection. If you choose to take medicine to prevent HIV, you should first get tested for HIV. You should then be tested every 3 months for as long as you are taking the medicine.  Follow these instructions at home:  Alcohol use  Do not drink alcohol if your health care provider tells you not to drink.  If you drink alcohol:  Limit how much you have to 0-2 drinks a day.  Know how much alcohol is in your drink. In the [place] one drink equals one 12 oz bottle of beer (355 mL), one 5 oz glass of wine (148 mL), or one 1 oz glass of hard liquor (44 mL).  Lifestyle  Do not use any products that contain nicotine or tobacco. These products include cigarettes, chewing tobacco, and vaping devices, such as e-cigarettes. If you need help quitting, ask your health care provider.  Do not use street drugs.  Do not share needles.  Ask your health care provider for help if you need support or information about quitting drugs.  General instructions  Schedule regular health, dental, and eye exams.  Stay current with your vaccines.  Tell your health care provider if:  You often feel depressed.  You have ever been abused or do not feel safe at home.  Summary  Adopting a healthy lifestyle and getting preventive care are important in promoting health and wellness.  Follow your health care provider's instructions about healthy diet, exercising, and getting tested or screened for diseases.  Follow your health care provider's instructions on monitoring your cholesterol and blood pressure.  This information is not intended to replace advice given to you by your health care provider. Make sure you discuss any questions you have with your health care provider.  Document Revised: 04/19/2021 Document Reviewed: 04/19/2021  Elsevier Patient Education  2024 [employer].

## 2025-01-15 NOTE — Progress Notes (Unsigned)
 I,[name], CMA,acting as a neurosurgeon for Edward LOISE Slocumb, MD.,have documented all relevant documentation on the behalf of Edward LOISE Slocumb, MD,as directed by  Edward LOISE Slocumb, MD while in the presence of Edward LOISE Slocumb, MD.  Subjective:   Patient ID: Edward Gallagher , male    DOB: 1941-02-06 , 84 y.o.   MRN: [id number]  Chief Complaint  Patient presents with   Annual Exam    Patient presents today for annual exam. He reports compliance with medications. Denies headache, chest pain & sob. He states his lesion on his right side came down on him yesterday. He feels his multiple myeloma triggered this. So he has relied on his walker for getting around today.   Hypertension   Prediabetes   Hyperlipidemia    HPI Discussed the use of AI scribe software for clinical note transcription with the patient, who gave verbal consent to proceed.  History of Present Illness Edward Gallagher is an 84 year old male who presents with new right hip pain.  He has new onset right hip pain that began yesterday, occurring when he gets up from a chair and attempts to walk. He describes the sensation as his right leg 'says, oh, no, you didn't.'  He initially took a pain pill around 5:30 PM, but the pain persisted. He took another pain pill at 10:30 PM before bed, experiencing difficulty getting into bed due to the pain. He only had to get up once during the night. This morning, he rated his hip pain as an 8 out of 10 and took another pain pill at 6:30 AM.  He typically takes tramadol  once a day and Tylenol  500 mg twice a day. His current medications include amlodipine , Eliquis , atorvastatin  40 mg, Revlimid  every other day, and Linzess , which he reports is working well. He also takes Lasix  and torasemide daily. He recently refilled his acyclovir  and uses fluticasone  daily for allergies, as it does not affect his blood pressure like other medications.  He has a history of myeloma and is on a reduced dose of  Revlimid  to manage his white blood cell count. He is scheduled for a follow-up at [facility] on the 23rd of this month and is unsure if the hip lesion has grown.  No recent swelling and no changes in his blood pressure management. He has not experienced any recent changes in his medication regimen except for the Revlimid  adjustment.   Patient is a 84 year old male who presents today for his annual physical  examination.  Patient was diagnosed with multiple myeloma in 2022, he got treated and is currently in remission but he goes to [facility] in [place] , [place] monthly for check ups.  Patient states he exercises daily by walking and plays golf 2-3 times a week. Patient states he sees Dr Watt Urologist who checks his PSA yearly, prostrate examination, states his next appointment is in March,2025. He reports that he will be getting Iron infusion at his next appointment,due to anemia. Patient is followed by Dr Newman Lawrence, Cardiology and he is on Eliquis  5 mg BID for PAF,Hypertension. He denies chest pain, palpitations, shortness of breath or headaches. He has no specific questions at this time.     Past Medical History:  Diagnosis Date   A-fib [facility])    Allergy    Arthritis    Benign hypertension with CKD (chronic kidney disease) stage III (HCC) 01/15/2024   Encounter for general adult medical examination w/o abnormal  findings 01/15/2024   Hyperlipidemia    Hypertension    Multiple myeloma (HCC)    Prostatic hypertrophy    Sinus trouble    Sleep apnea    CPAP   Wears dentures    full upper, partial lower     Family History  Problem Relation Age of Onset   Healthy Mother    Hypertension Mother    Prostate cancer Father    Cancer Father    Testicular cancer Brother    Hypertension Brother    Prostate cancer Brother    Lung cancer Brother    Cancer Brother    Cancer Brother        double mynoma   Hypertension Other    Cancer Son      Current Outpatient Medications:     acetaminophen  (TYLENOL ) 500 MG tablet, Take 500 mg by mouth as needed., Disp: , Rfl:    acyclovir  (ZOVIRAX ) 400 MG tablet, TAKE 1 TABLET(400 MG) BY MOUTH EVERY 12 HOURS, Disp: 180 tablet, Rfl: 0   amLODipine  (NORVASC ) 2.5 MG tablet, Take 1 tablet (2.5 mg total) by mouth daily., Disp: 90 tablet, Rfl: 3   apixaban  (ELIQUIS ) 2.5 MG TABS tablet, Take 1 tablet (2.5 mg total) by mouth 2 (two) times daily., Disp: 200 tablet, Rfl: 1   atorvastatin  (LIPITOR) 40 MG tablet, Take 1 tablet (40 mg total) by mouth daily., Disp: 90 tablet, Rfl: 3   cetirizine (ZYRTEC) 10 MG tablet, Take 10 mg by mouth daily., Disp: , Rfl:    finasteride  (PROSCAR ) 5 MG tablet, Take 5 mg by mouth daily., Disp: , Rfl:    lenalidomide  (REVLIMID ) 5 MG capsule, Take 5 mg by mouth daily., Disp: , Rfl:    LINZESS  145 MCG CAPS capsule, TAKE 1 CAPSULE(145 MCG) BY MOUTH DAILY BEFORE BREAKFAST, Disp: 90 capsule, Rfl: 2   Misc Natural Products (NEURIVA PO), Take by mouth at bedtime., Disp: , Rfl:    nitroGLYCERIN  (NITROSTAT ) 0.4 MG SL tablet, Place 1 tablet (0.4 mg total) under the tongue every 5 (five) minutes as needed for chest pain., Disp: 30 tablet, Rfl: 3   NON FORMULARY, NEURIVA BRAIN HEALTH, Disp: , Rfl:    senna-docusate (SENOKOT-S) 8.6-50 MG tablet, Take 1 tablet by mouth 2 (two) times daily., Disp: , Rfl:    terazosin  (HYTRIN ) 10 MG capsule, Take 1 capsule (10 mg total) by mouth at bedtime., Disp: 90 capsule, Rfl: 2   traMADol  (ULTRAM ) 50 MG tablet, Take 1 tablet by mouth every 6 (six) hours as needed., Disp: , Rfl:    Xylitol (XYLIMELTS MT), Use as directed in the mouth or throat at bedtime., Disp: , Rfl:    fluticasone  (FLONASE ) 50 MCG/ACT nasal spray, SHAKE LIQUID AND USE 1 SPRAY IN EACH NOSTRIL DAILY, Disp: 16 g, Rfl: 2   furosemide  (LASIX ) 20 MG tablet, Take 1 tablet (20 mg total) by mouth daily. TAKE 1 TABLET(20 MG) BY MOUTH DAILY, Disp: 90 tablet, Rfl: 2   No Known Allergies   Men's preventive visit. Patient Health  Questionnaire (PHQ-2) is  Flowsheet Row Office Visit from 01/15/2025 in [facility]  PHQ-2 Total Score 0  . Patient is on a healthy, low sodium diet. Marital status: Married. Relevant history for alcohol use is:  Social History   Substance and Sexual Activity  Alcohol Use Not Currently   Comment: I havent had a drink in over fiur years.  . Relevant history for tobacco use is: Tobacco Use History[1].  Review of Systems  Constitutional: Negative.   HENT: Negative.    Eyes: Negative.   Respiratory: Negative.    Cardiovascular: Negative.   Gastrointestinal: Negative.   Endocrine: Negative.   Genitourinary: Negative.   Musculoskeletal:  Positive for arthralgias.  Skin: Negative.   Allergic/Immunologic: Negative.   Neurological: Negative.   Hematological: Negative.      Today's Vitals   01/15/25 0847  BP: 130/78  Pulse: 64  Temp: 98.1 F (36.7 C)  SpO2: 98%  Weight: 243 lb 6.4 oz (110.4 kg)  Height: 6' 3 (1.905 m)   Body mass index is 30.42 kg/m.  Wt Readings from Last 3 Encounters:  01/15/25 243 lb 6.4 oz (110.4 kg)  10/21/24 246 lb 6.4 oz (111.8 kg)  07/17/24 242 lb (109.8 kg)    Objective:  Physical Exam Constitutional:      Appearance: Normal appearance.  HENT:     Head: Normocephalic.  Eyes:     Extraocular Movements: Extraocular movements intact.  Cardiovascular:     Rate and Rhythm: Bradycardia present.     Heart sounds: No murmur heard. Pulmonary:     Effort: Pulmonary effort is normal.     Breath sounds: Normal breath sounds.  Abdominal:     General: Bowel sounds are normal.     Palpations: Abdomen is soft.     Tenderness: There is no abdominal tenderness. There is no guarding.     Hernia: No hernia is present.  Musculoskeletal:     Cervical back: Normal range of motion.  Skin:    General: Skin is warm and dry.  Neurological:     Mental Status: He is alert and oriented to person, place, and time.   Psychiatric:        Mood and Affect: Mood normal.         Assessment And Plan:    Assessment & Plan Encounter for general adult medical examination w/o abnormal findings     Hypertensive heart and renal disease with renal failure, stage 1 through stage 4 or unspecified chronic kidney disease, without heart failure  Orders:   Lipid panel   POCT Urinalysis Dipstick (81002)   Microalbumin / Creatinine Urine Ratio   EKG 12-Lead  Paroxysmal atrial fibrillation (HCC)     Stage 3a chronic kidney disease (HCC) Chronic, I will refer him for renal ultrasound. I will also check labwork as below.  Orders:   Parathyroid Hormone, Intact w/Ca   Phosphorus   US  Renal; Future  Prediabetes Previous labs reviewed, his A1c has been elevated in the past. I will check an A1c today. Reminded to avoid refined sugars including sugary drinks/foods and processed meats including bacon, sausages and deli meats.       Pure hypercholesterolemia  Orders:   Lipid panel  Multiple myeloma, remission status unspecified (HCC)      Assessment and Plan Assessment & Plan Adult wellness visit Routine visit with no new concerns. Blood pressure and leg swelling well-controlled. EKG shows sinus rhythm. - Scheduled next year's physical. - Scheduled six-month blood pressure check.  Right hip pain Acute pain rated 8/10, exacerbated by movement, not relieved by tramadol . Concerns about tramadol  addiction. - Advised taking tramadol  every 12 hours during flare-ups. - Recommended combining tramadol  with acetaminophen . - Consider x-ray if pain persists after medication adjustment.  Hypertensive heart and chronic kidney disease, stage 3a Chronic condition with well-controlled blood pressure. Kidney function tests pending. - Ordered kidney-specific lab work. - Continue current antihypertensive regimen.  Paroxysmal atrial  fibrillation Currently in sinus rhythm. - Continue current management with  Eliquis .  Multiple myeloma Managed with lenalidomide , adjusted to every other day for white blood cell count management. - Continue lenalidomide  regimen as adjusted.  Pure hypercholesterolemia Cholesterol levels to be monitored with upcoming lab work. - Ordered cholesterol lab work.  Return for 1 year physical, 6 month bp. Patient was given opportunity to ask questions. Patient verbalized understanding of the plan and was able to repeat key elements of the plan. All questions were answered to their satisfaction.   I, Edward LOISE Slocumb, MD, have reviewed all documentation for this visit. The documentation on 01/15/25 for the exam, diagnosis, procedures, and orders are all accurate and complete.      [1]  Social History Tobacco Use  Smoking Status Former   Current packs/day: 0.00   Average packs/day: 0.5 packs/day for 35.0 years (17.5 ttl pk-yrs)   Types: Cigarettes   Start date: 12/12/1962   Quit date: 12/12/1997   Years since quitting: 27.1  Smokeless Tobacco Never  Tobacco Comments   I quit in Sept , 1999

## 2025-01-15 NOTE — Assessment & Plan Note (Addendum)
 Previous labs reviewed, his A1c has been elevated in the past. I will check an A1c today. Reminded to avoid refined sugars including sugary drinks/foods and processed meats including bacon, sausages and deli meats.

## 2025-01-15 NOTE — Assessment & Plan Note (Addendum)
 Edward Gallagher

## 2025-01-15 NOTE — Assessment & Plan Note (Deleted)
 SABRA

## 2025-01-16 LAB — PHOSPHORUS: Phosphorus: 3.3 mg/dL (ref 2.8–4.1)

## 2025-01-16 LAB — LIPID PANEL
Chol/HDL Ratio: 2.5 ratio (ref 0.0–5.0)
Cholesterol, Total: 136 mg/dL (ref 100–199)
HDL: 54 mg/dL
LDL Chol Calc (NIH): 66 mg/dL (ref 0–99)
Triglycerides: 80 mg/dL (ref 0–149)
VLDL Cholesterol Cal: 16 mg/dL (ref 5–40)

## 2025-01-16 LAB — MICROALBUMIN / CREATININE URINE RATIO
Creatinine, Urine: 127.9 mg/dL
Microalb/Creat Ratio: 6 mg/g{creat} (ref 0–29)
Microalbumin, Urine: 7.6 ug/mL

## 2025-01-16 LAB — PTH, INTACT AND CALCIUM
Calcium: 9.4 mg/dL (ref 8.6–10.2)
PTH: 30 pg/mL (ref 15–65)

## 2025-01-21 ENCOUNTER — Ambulatory Visit

## 2025-01-22 ENCOUNTER — Ambulatory Visit

## 2025-02-14 ENCOUNTER — Ambulatory Visit: Admitting: Cardiology

## 2025-06-11 ENCOUNTER — Ambulatory Visit: Payer: Self-pay

## 2025-07-16 ENCOUNTER — Ambulatory Visit: Payer: Self-pay | Admitting: Internal Medicine

## 2026-01-19 ENCOUNTER — Encounter: Payer: Self-pay | Admitting: Internal Medicine
